# Patient Record
Sex: Female | Born: 1960 | Race: White | Hispanic: No | Smoking: Never smoker
Health system: Southern US, Community
[De-identification: ages and names within clinical notes are randomized; demographics above are authoritative.]

## PROBLEM LIST (undated history)

## (undated) DIAGNOSIS — F329 Major depressive disorder, single episode, unspecified: Secondary | ICD-10-CM

## (undated) DIAGNOSIS — Z9889 Other specified postprocedural states: Secondary | ICD-10-CM

## (undated) DIAGNOSIS — Z87442 Personal history of urinary calculi: Secondary | ICD-10-CM

## (undated) DIAGNOSIS — M1990 Unspecified osteoarthritis, unspecified site: Secondary | ICD-10-CM

## (undated) DIAGNOSIS — R945 Abnormal results of liver function studies: Secondary | ICD-10-CM

## (undated) DIAGNOSIS — C801 Malignant (primary) neoplasm, unspecified: Secondary | ICD-10-CM

## (undated) DIAGNOSIS — I10 Essential (primary) hypertension: Secondary | ICD-10-CM

## (undated) DIAGNOSIS — IMO0001 Reserved for inherently not codable concepts without codable children: Secondary | ICD-10-CM

## (undated) DIAGNOSIS — K219 Gastro-esophageal reflux disease without esophagitis: Secondary | ICD-10-CM

## (undated) DIAGNOSIS — F4310 Post-traumatic stress disorder, unspecified: Secondary | ICD-10-CM

## (undated) DIAGNOSIS — J45909 Unspecified asthma, uncomplicated: Secondary | ICD-10-CM

## (undated) DIAGNOSIS — R519 Headache, unspecified: Secondary | ICD-10-CM

## (undated) DIAGNOSIS — M4802 Spinal stenosis, cervical region: Secondary | ICD-10-CM

## (undated) DIAGNOSIS — F419 Anxiety disorder, unspecified: Secondary | ICD-10-CM

## (undated) DIAGNOSIS — G4730 Sleep apnea, unspecified: Secondary | ICD-10-CM

## (undated) DIAGNOSIS — I219 Acute myocardial infarction, unspecified: Secondary | ICD-10-CM

## (undated) DIAGNOSIS — J189 Pneumonia, unspecified organism: Secondary | ICD-10-CM

## (undated) DIAGNOSIS — R42 Dizziness and giddiness: Secondary | ICD-10-CM

## (undated) DIAGNOSIS — R3129 Other microscopic hematuria: Secondary | ICD-10-CM

## (undated) DIAGNOSIS — M5126 Other intervertebral disc displacement, lumbar region: Secondary | ICD-10-CM

## (undated) DIAGNOSIS — F32A Depression, unspecified: Secondary | ICD-10-CM

## (undated) DIAGNOSIS — H269 Unspecified cataract: Secondary | ICD-10-CM

## (undated) DIAGNOSIS — K589 Irritable bowel syndrome without diarrhea: Secondary | ICD-10-CM

## (undated) DIAGNOSIS — F319 Bipolar disorder, unspecified: Secondary | ICD-10-CM

## (undated) DIAGNOSIS — M51369 Other intervertebral disc degeneration, lumbar region without mention of lumbar back pain or lower extremity pain: Secondary | ICD-10-CM

## (undated) DIAGNOSIS — T753XXA Motion sickness, initial encounter: Secondary | ICD-10-CM

## (undated) DIAGNOSIS — J310 Chronic rhinitis: Secondary | ICD-10-CM

## (undated) DIAGNOSIS — R51 Headache: Secondary | ICD-10-CM

## (undated) DIAGNOSIS — G2581 Restless legs syndrome: Secondary | ICD-10-CM

## (undated) DIAGNOSIS — R7989 Other specified abnormal findings of blood chemistry: Secondary | ICD-10-CM

## (undated) DIAGNOSIS — T1491XA Suicide attempt, initial encounter: Secondary | ICD-10-CM

## (undated) DIAGNOSIS — Z8669 Personal history of other diseases of the nervous system and sense organs: Secondary | ICD-10-CM

## (undated) DIAGNOSIS — R011 Cardiac murmur, unspecified: Secondary | ICD-10-CM

## (undated) DIAGNOSIS — M810 Age-related osteoporosis without current pathological fracture: Secondary | ICD-10-CM

## (undated) DIAGNOSIS — D649 Anemia, unspecified: Secondary | ICD-10-CM

## (undated) DIAGNOSIS — M5136 Other intervertebral disc degeneration, lumbar region: Secondary | ICD-10-CM

## (undated) DIAGNOSIS — R112 Nausea with vomiting, unspecified: Secondary | ICD-10-CM

## (undated) HISTORY — DX: Post-traumatic stress disorder, unspecified: F43.10

## (undated) HISTORY — PX: TONSILLECTOMY: SUR1361

## (undated) HISTORY — DX: Unspecified cataract: H26.9

## (undated) HISTORY — PX: TUBAL LIGATION: SHX77

## (undated) HISTORY — DX: Anxiety disorder, unspecified: F41.9

## (undated) HISTORY — DX: Major depressive disorder, single episode, unspecified: F32.9

## (undated) HISTORY — DX: Other microscopic hematuria: R31.29

## (undated) HISTORY — DX: Unspecified asthma, uncomplicated: J45.909

## (undated) HISTORY — PX: FOOT SURGERY: SHX648

## (undated) HISTORY — DX: Acute myocardial infarction, unspecified: I21.9

## (undated) HISTORY — DX: Depression, unspecified: F32.A

## (undated) HISTORY — DX: Suicide attempt, initial encounter: T14.91XA

## (undated) HISTORY — DX: Personal history of other diseases of the nervous system and sense organs: Z86.69

## (undated) HISTORY — PX: CHOLECYSTECTOMY: SHX55

## (undated) HISTORY — DX: Gastro-esophageal reflux disease without esophagitis: K21.9

## (undated) HISTORY — DX: Chronic rhinitis: J31.0

## (undated) HISTORY — DX: Bipolar disorder, unspecified: F31.9

## (undated) HISTORY — DX: Malignant (primary) neoplasm, unspecified: C80.1

## (undated) HISTORY — DX: Age-related osteoporosis without current pathological fracture: M81.0

## (undated) HISTORY — PX: SEPTOPLASTY: SUR1290

---

## 1987-07-07 HISTORY — PX: VAGINAL HYSTERECTOMY: SUR661

## 2000-07-06 HISTORY — PX: ANTERIOR AND POSTERIOR REPAIR: SHX5121

## 2001-02-23 ENCOUNTER — Inpatient Hospital Stay (HOSPITAL_COMMUNITY): Admission: EM | Admit: 2001-02-23 | Discharge: 2001-02-27 | Payer: Self-pay | Admitting: *Deleted

## 2001-11-02 ENCOUNTER — Other Ambulatory Visit: Admission: RE | Admit: 2001-11-02 | Discharge: 2001-11-02 | Payer: Self-pay | Admitting: Family Medicine

## 2001-11-08 ENCOUNTER — Ambulatory Visit (HOSPITAL_COMMUNITY): Admission: RE | Admit: 2001-11-08 | Discharge: 2001-11-08 | Payer: Self-pay | Admitting: Neurosurgery

## 2002-07-06 HISTORY — PX: BILATERAL SALPINGOOPHORECTOMY: SHX1223

## 2002-10-29 ENCOUNTER — Inpatient Hospital Stay (HOSPITAL_COMMUNITY): Admission: EM | Admit: 2002-10-29 | Discharge: 2002-11-02 | Payer: Self-pay | Admitting: Psychiatry

## 2004-08-05 ENCOUNTER — Inpatient Hospital Stay: Payer: Self-pay | Admitting: Psychiatry

## 2004-09-14 ENCOUNTER — Emergency Department: Payer: Self-pay | Admitting: Unknown Physician Specialty

## 2004-10-23 ENCOUNTER — Ambulatory Visit: Payer: Self-pay

## 2004-12-01 ENCOUNTER — Ambulatory Visit: Payer: Self-pay

## 2005-02-13 ENCOUNTER — Inpatient Hospital Stay: Payer: Self-pay | Admitting: Psychiatry

## 2005-03-24 ENCOUNTER — Emergency Department: Payer: Self-pay | Admitting: Emergency Medicine

## 2005-03-25 ENCOUNTER — Ambulatory Visit: Payer: Self-pay | Admitting: Emergency Medicine

## 2005-03-26 ENCOUNTER — Ambulatory Visit: Payer: Self-pay | Admitting: General Practice

## 2005-04-15 ENCOUNTER — Emergency Department: Payer: Self-pay | Admitting: Emergency Medicine

## 2005-05-08 ENCOUNTER — Ambulatory Visit: Payer: Self-pay

## 2005-05-14 ENCOUNTER — Inpatient Hospital Stay: Payer: Self-pay

## 2005-07-06 HISTORY — PX: CATARACT EXTRACTION: SUR2

## 2005-07-08 ENCOUNTER — Inpatient Hospital Stay: Payer: Self-pay | Admitting: Psychiatry

## 2005-08-25 ENCOUNTER — Ambulatory Visit: Payer: Self-pay | Admitting: Family Medicine

## 2005-12-07 ENCOUNTER — Ambulatory Visit: Payer: Self-pay

## 2006-01-10 ENCOUNTER — Other Ambulatory Visit: Payer: Self-pay

## 2006-01-10 ENCOUNTER — Inpatient Hospital Stay: Payer: Self-pay | Admitting: Internal Medicine

## 2006-02-10 ENCOUNTER — Ambulatory Visit: Payer: Self-pay | Admitting: Gastroenterology

## 2006-04-11 ENCOUNTER — Emergency Department: Payer: Self-pay | Admitting: Emergency Medicine

## 2006-04-12 ENCOUNTER — Ambulatory Visit: Payer: Self-pay | Admitting: Family Medicine

## 2006-04-27 ENCOUNTER — Ambulatory Visit: Payer: Self-pay | Admitting: Gastroenterology

## 2006-06-26 ENCOUNTER — Emergency Department: Payer: Self-pay | Admitting: Emergency Medicine

## 2006-07-06 HISTORY — PX: SHOULDER ARTHROSCOPY: SHX128

## 2006-07-06 HISTORY — PX: CARDIAC CATHETERIZATION: SHX172

## 2006-07-18 ENCOUNTER — Emergency Department: Payer: Self-pay | Admitting: Emergency Medicine

## 2006-07-21 ENCOUNTER — Emergency Department: Payer: Self-pay | Admitting: Emergency Medicine

## 2006-07-21 ENCOUNTER — Other Ambulatory Visit: Payer: Self-pay

## 2006-07-22 ENCOUNTER — Emergency Department: Payer: Self-pay | Admitting: Emergency Medicine

## 2006-07-23 ENCOUNTER — Ambulatory Visit: Payer: Self-pay | Admitting: Emergency Medicine

## 2006-07-29 ENCOUNTER — Emergency Department (HOSPITAL_COMMUNITY): Admission: EM | Admit: 2006-07-29 | Discharge: 2006-07-30 | Payer: Self-pay | Admitting: Emergency Medicine

## 2006-08-23 ENCOUNTER — Ambulatory Visit: Payer: Self-pay

## 2006-09-02 ENCOUNTER — Ambulatory Visit: Payer: Self-pay | Admitting: Urology

## 2006-09-23 ENCOUNTER — Ambulatory Visit: Payer: Self-pay | Admitting: Cardiovascular Disease

## 2006-12-29 ENCOUNTER — Ambulatory Visit: Payer: Self-pay

## 2007-01-04 ENCOUNTER — Ambulatory Visit: Payer: Self-pay

## 2007-01-07 ENCOUNTER — Ambulatory Visit: Payer: Self-pay | Admitting: Emergency Medicine

## 2007-01-13 ENCOUNTER — Ambulatory Visit: Payer: Self-pay | Admitting: Emergency Medicine

## 2007-01-13 ENCOUNTER — Ambulatory Visit: Payer: Self-pay

## 2007-01-17 ENCOUNTER — Ambulatory Visit: Payer: Self-pay | Admitting: Family Medicine

## 2007-01-21 ENCOUNTER — Ambulatory Visit: Payer: Self-pay | Admitting: Emergency Medicine

## 2007-01-22 ENCOUNTER — Emergency Department: Payer: Self-pay | Admitting: Emergency Medicine

## 2007-02-05 ENCOUNTER — Ambulatory Visit: Payer: Self-pay | Admitting: Internal Medicine

## 2007-02-10 ENCOUNTER — Emergency Department: Payer: Self-pay

## 2007-02-10 ENCOUNTER — Other Ambulatory Visit: Payer: Self-pay

## 2007-03-06 ENCOUNTER — Emergency Department: Payer: Self-pay | Admitting: Internal Medicine

## 2007-04-12 ENCOUNTER — Emergency Department: Payer: Self-pay | Admitting: Emergency Medicine

## 2007-04-13 ENCOUNTER — Other Ambulatory Visit: Payer: Self-pay

## 2007-04-14 ENCOUNTER — Inpatient Hospital Stay: Payer: Self-pay

## 2007-06-11 ENCOUNTER — Ambulatory Visit: Payer: Self-pay | Admitting: Internal Medicine

## 2007-07-15 ENCOUNTER — Encounter: Payer: Self-pay | Admitting: Specialist

## 2007-07-21 ENCOUNTER — Ambulatory Visit: Payer: Self-pay

## 2007-08-20 ENCOUNTER — Emergency Department: Payer: Self-pay | Admitting: Internal Medicine

## 2007-10-18 ENCOUNTER — Ambulatory Visit: Payer: Self-pay | Admitting: Unknown Physician Specialty

## 2007-10-20 ENCOUNTER — Ambulatory Visit: Payer: Self-pay | Admitting: Unknown Physician Specialty

## 2008-01-06 ENCOUNTER — Inpatient Hospital Stay: Payer: Self-pay | Admitting: Unknown Physician Specialty

## 2008-03-26 ENCOUNTER — Ambulatory Visit: Payer: Self-pay

## 2008-05-30 ENCOUNTER — Ambulatory Visit: Payer: Self-pay

## 2008-06-26 ENCOUNTER — Ambulatory Visit: Payer: Self-pay

## 2008-08-28 ENCOUNTER — Ambulatory Visit: Payer: Self-pay | Admitting: Family Medicine

## 2008-10-13 ENCOUNTER — Ambulatory Visit: Payer: Self-pay | Admitting: Unknown Physician Specialty

## 2009-01-25 ENCOUNTER — Ambulatory Visit: Payer: Self-pay | Admitting: Family Medicine

## 2009-01-28 ENCOUNTER — Ambulatory Visit: Payer: Self-pay | Admitting: Gastroenterology

## 2009-01-29 ENCOUNTER — Emergency Department: Payer: Self-pay | Admitting: Emergency Medicine

## 2009-04-13 ENCOUNTER — Emergency Department: Payer: Self-pay | Admitting: Emergency Medicine

## 2009-08-01 ENCOUNTER — Ambulatory Visit: Payer: Self-pay | Admitting: Physical Medicine & Rehabilitation

## 2009-08-16 ENCOUNTER — Ambulatory Visit: Payer: Self-pay | Admitting: Unknown Physician Specialty

## 2009-09-02 ENCOUNTER — Ambulatory Visit: Payer: Self-pay | Admitting: Internal Medicine

## 2010-03-21 ENCOUNTER — Inpatient Hospital Stay: Payer: Self-pay | Admitting: Internal Medicine

## 2010-04-23 ENCOUNTER — Emergency Department: Payer: Self-pay | Admitting: Unknown Physician Specialty

## 2010-05-10 ENCOUNTER — Emergency Department: Payer: Self-pay | Admitting: Emergency Medicine

## 2010-06-12 ENCOUNTER — Emergency Department: Payer: Self-pay | Admitting: Emergency Medicine

## 2010-06-12 ENCOUNTER — Ambulatory Visit: Payer: Self-pay | Admitting: Family Medicine

## 2010-09-04 ENCOUNTER — Emergency Department: Payer: Self-pay | Admitting: Emergency Medicine

## 2010-09-15 ENCOUNTER — Emergency Department: Payer: Self-pay | Admitting: Emergency Medicine
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
## 2010-11-21 NOTE — Discharge Summary (Signed)
[facility]  Patient:    Brandi Bell, Brandi Bell Visit Number: [id number] MRN: [id number]          Service Type: PSY Location: 50 0502 02 Attending Physician:  Denny Peon Dictated by:   Netta Cedars, M.D. Admit Date:  02/23/2001 Discharge Date: 02/27/2001                             Discharge Summary  INTRODUCTION:  Brandi Bell is a 50 year old divorced white female mother of three children ranging between ages 85 and 57.  She was admitted on involuntary papers after expressing suicidal thoughts in the emergency room. These suicidal thoughts and intentions were related to severe groin pain she developed and which cause was not yet established.  She had a significant psychiatric history for both inpatient and outpatient treatment and allegedly suffers from dissociative identity disorder, having at least three or four competing personalities.  Medically, she suffers from irritable bowel syndrome and recurrent migraine headaches as well as most recently groin pain.  HOSPITAL COURSE:  After admission to the ward, the patient was placed on special observation.  She was restarted on her previous medications.  Seroquel was decreased to 600 mg daily and Paxil increased to 75 mg daily.  Because of mood instability, anger, and anxiety, I started her on Neurontin 100 mg three times a day, avoiding benzodiazepines unless absolutely necessary.  The patient was also started on amoxicillin for symptoms of UTI.  In the process, Neurontin was increased to 100 mg twice a day and 200 mg at night with good results.  Gradually, the patients mood and affect improved.  On August 24 and August 25, was seen by Dr. Dub Mikes.  She presented with no dissociative episodes, no suicidal or no homicidal urging, and the patient felt ready to be discharged to outpatient service.  It was felt that the patient was ready for discharge.  She was seen again on August 24 and August 25.   She did not express dangerous ideations, no suicidal or homicidal ideations and no delusions.  In the future, we may think about resigning from Paxil and to leave the patient only on Neurontin as a mood stabilizer since tiredness and drowsiness were persistent.  REVIEW OF ADDITIONAL LABORATORY DATA:  Normal CBC with differential.  Normal Chem 17.  Normal transaminase.  Normal thyroid function tests.  DISCHARGE DIAGNOSES: Axis I:    1. Major depressive disorder with psychotic features.            2. Post-traumatic stress disorder.            3. Dissociative identity disorder. Axis II:   Borderline personality disorder, severe. Axis III:  1. Migraine headaches.            2. Irritable bowel syndrome.            3. Chronic groin pain.  DISCHARGE MEDICATIONS: 1. Klonopin 0.5 mg twice a day. 2. Seroquel 200 mg in the morning and 400 mg at bedtime. 3. Paxil 30 mg at bedtime. 4. Elavil 25 mg at bedtime. 5. Amoxicillin 500 mg three times a day for the next five days. 6. Bextra 10 mg daily. 7. Neurontin 300 mg one in the morning and three at bedtime.  RECOMMENDATIONS:  The patient has an appointment with Dr. Georjean Mode at [facility].  She did not have any side effects from Neurontin and felt that this medication "  holds her on an even keel."  As mentioned before, the patient seemed to be reaching the level of existence which characterized her previous years.  Decision was made to discharge her to the same environment with recommendation to follow up with mental health center.  The patient knows that resources are available after her discharge.  She should call mental health if problems with medication or recurrence of symptoms.  The patient understood instructions and at the time of discharge did not have any side effects and was discharged in good condition home. Dictated by:   Netta Cedars, M.D. Attending Physician:  Denny Peon DD:  04/06/01 TD:   04/06/01 Job: [id number]
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
## 2010-11-21 NOTE — Discharge Summary (Signed)
NAME:  Brandi Bell, Brandi Bell                            ACCOUNT NO.:  [id number]   MEDICAL RECORD NO.:  [id number]                   PATIENT TYPE:  IPS   LOCATION:  0306                                 [facility]ILITY[facility]  [facility]   PHYSICIAN:  Jeanice Lim, M.D.              DATE OF BIRTH:  [birth year]   DATE OF ADMISSION:  10/29/2002  DATE OF DISCHARGE:  11/02/2002                                 DISCHARGE SUMMARY   IDENTIFYING DATA:  This is a 50 year old married Caucasian female  voluntarily admitted.  She presented with husband, angry, agitated with  visual hallucinations, auditory hallucinations, wanting to cut self.  Mood:  Labile, agitated, unpredictable.   MEDICATIONS:  1. Senokot.  2. Geodon 80 mg b.i.d.  3. Klonopin 1 mg t.i.d.  4. Neurontin 300 mg q.a.m. and 600 mg q.h.s.  5. Calcium and vitamin E.  6. Haldol 1 mg b.i.d.   DRUG ALLERGIES:  CODEINE and CIPRO.   PHYSICAL EXAMINATION:  GENERAL:  Essentially within normal limits.  NEUROLOGIC:  Nonfocal.   LABORATORY DATA:  Routine admission labs: Liver enzymes: SGOT and SGPT  slightly elevated at 40 and 61, respectively.  CBC: Within normal limits.   MENTAL STATUS EXAM:  Obese female in no acute distress.  Blunted affect,  cooperative.  Speech: Within normal limits.  Mood: Depressed, mildly  anxious.  Thought process: Mostly goal directed; vague suicidal ideation  with no plan.  Cognitive: Intact.  Judgment and insight: Fair.   ADMISSION DIAGNOSES:   AXIS I:  1. Major depressive disorder, recurrent, severe with psychotic symptoms.  2. Posttraumatic stress disorder by history.   AXIS II:  Deferred.   AXIS III:  Pituitary adenoma.   AXIS IV:  Moderate problems with primary support group.   AXIS V:  29/59   HOSPITAL COURSE:  The patient was admitted, ordered routine p.r.n.  medications, underwent further monitoring, and was encouraged to participate  in individual, group, and milieu therapy.  The patient's urine toxicity  screen was positive for benzodiazepines.  TSH was 6.13, slightly elevated;  repeat was 1.8.  Liver function tests returned to more normal limits.  Prolactin level was 55.8, which is elevated unless in a pregnant state  consistent with pituitary adenoma.  The patient was optimized on Geodon and  Ativan.  Haldol was discontinued and Loxitane started.  The patient reported  a positive response to medication changes.  Geodon was further optimized to  120 mg and Loxitane was ordered just p.r.n.  EKG was ordered to evaluate QTc  due to elevated dose.  The patient has an incomplete right bundle branch  block.  QTc was 324/398 and there was normal sinus rhythm.  Cardiology  reported no concerns regarding being on Geodon.   CONDITION ON DISCHARGE:  The patient's condition on discharge was markedly  improved.  Mood was more euthymic.  Affect: Brighter.  Thought processes:  Goal directed.  Thought content: Negative for dangerous ideation or  psychotic symptoms.   DISCHARGE MEDICATIONS:  1. Os-Cal.  2. Lexapro 20 mg q.a.m.  3. Vitamin E.  4. Neurontin 300 mg b.i.d. and two q.h.s.  5. Ativan 1 mg t.i.d. and two q.h.s.  6. Loxitane 10 mg b.i.d. and q.12h. p.r.n.  7. Geodon 120 mg q.a.m., 120 mg at 6 p.m. with food.   FOLLOW UP:  The patient was to follow up with Dr. Derrill Center and Christene Slates. Katrinka Blazing, M.D., on May 4 at 10:30.   DISCHARGE DIAGNOSES:   AXIS I:  1. Major depressive disorder, recurrent, severe with psychotic symptoms.  2. Posttraumatic stress disorder by history.   AXIS II:  Deferred.   AXIS III:  Pituitary adenoma.   AXIS IV:  Moderate problems with primary support group.   AXIS V:  Global assessment of functioning on discharge was 55.                                               Jeanice Lim, M.D.    JEM/MEDQ  D:  11/29/2002  T:  11/29/2002  Job:  [id number]

## 2011-02-13 ENCOUNTER — Emergency Department: Payer: Self-pay | Admitting: Emergency Medicine

## 2011-03-18 ENCOUNTER — Emergency Department: Payer: Self-pay | Admitting: Emergency Medicine

## 2011-03-18 ENCOUNTER — Inpatient Hospital Stay: Payer: Self-pay | Admitting: Internal Medicine

## 2011-06-10 ENCOUNTER — Ambulatory Visit: Payer: Self-pay | Admitting: Gastroenterology

## 2011-06-24 ENCOUNTER — Ambulatory Visit: Payer: Self-pay | Admitting: Gastroenterology

## 2011-06-26 LAB — PATHOLOGY REPORT

## 2012-02-01 ENCOUNTER — Inpatient Hospital Stay: Payer: Self-pay | Admitting: Family Medicine

## 2012-02-01 LAB — CBC WITH DIFFERENTIAL/PLATELET
Basophil #: 0.1 10*3/uL (ref 0.0–0.1)
Eosinophil #: 0 10*3/uL (ref 0.0–0.7)
HGB: 15.5 g/dL (ref 12.0–16.0)
Lymphocyte %: 10 %
MCH: 30.9 pg (ref 26.0–34.0)
MCHC: 34.7 g/dL (ref 32.0–36.0)
MCV: 89 fL (ref 80–100)
Monocyte #: 0.5 x10 3/mm (ref 0.2–0.9)
Neutrophil %: 84.8 %
Platelet: 291 10*3/uL (ref 150–440)
RBC: 5.03 10*6/uL (ref 3.80–5.20)
WBC: 12.2 10*3/uL — ABNORMAL HIGH (ref 3.6–11.0)

## 2012-02-01 LAB — COMPREHENSIVE METABOLIC PANEL
Bilirubin,Total: 0.5 mg/dL (ref 0.2–1.0)
Chloride: 108 mmol/L — ABNORMAL HIGH (ref 98–107)
Co2: 21 mmol/L (ref 21–32)
Creatinine: 1 mg/dL (ref 0.60–1.30)
EGFR (African American): >60
EGFR (Non-African Amer.): >60
Osmolality: 286 (ref 275–301)
Sodium: 142 mmol/L (ref 136–145)
Total Protein: 7.6 g/dL (ref 6.4–8.2)

## 2012-02-02 LAB — BASIC METABOLIC PANEL
Anion Gap: 10 (ref 7–16)
BUN: 8 mg/dL (ref 7–18)
Chloride: 113 mmol/L — ABNORMAL HIGH (ref 98–107)
Creatinine: 0.65 mg/dL (ref 0.60–1.30)
EGFR (African American): >60
EGFR (Non-African Amer.): >60
Sodium: 145 mmol/L (ref 136–145)

## 2012-02-02 LAB — CBC WITH DIFFERENTIAL/PLATELET
Basophil #: 0 10*3/uL (ref 0.0–0.1)
Eosinophil #: 0 10*3/uL (ref 0.0–0.7)
HCT: 39.6 % (ref 35.0–47.0)
HGB: 13.9 g/dL (ref 12.0–16.0)
Lymphocyte #: 0.6 10*3/uL — ABNORMAL LOW (ref 1.0–3.6)
MCH: 31.5 pg (ref 26.0–34.0)
MCHC: 35 g/dL (ref 32.0–36.0)
MCV: 90 fL (ref 80–100)
Monocyte %: 1.4 %
Neutrophil #: 15.1 10*3/uL — ABNORMAL HIGH (ref 1.4–6.5)
Platelet: 235 10*3/uL (ref 150–440)
RDW: 12.7 % (ref 11.5–14.5)

## 2012-02-03 LAB — BASIC METABOLIC PANEL
Anion Gap: 8 (ref 7–16)
BUN: 12 mg/dL (ref 7–18)
Creatinine: 0.58 mg/dL — ABNORMAL LOW (ref 0.60–1.30)
EGFR (African American): >60
EGFR (Non-African Amer.): >60
Glucose: 136 mg/dL — ABNORMAL HIGH (ref 65–99)
Osmolality: 292 (ref 275–301)

## 2012-02-03 LAB — CBC WITH DIFFERENTIAL/PLATELET
Eosinophil #: 0 10*3/uL (ref 0.0–0.7)
Eosinophil %: 0 %
Lymphocyte #: 0.8 10*3/uL — ABNORMAL LOW (ref 1.0–3.6)
MCH: 31.1 pg (ref 26.0–34.0)
MCHC: 34.5 g/dL (ref 32.0–36.0)
MCV: 90 fL (ref 80–100)
Monocyte #: 0.5 x10 3/mm (ref 0.2–0.9)
Neutrophil %: 94.5 %
Platelet: 256 10*3/uL (ref 150–440)
RBC: 4.61 10*6/uL (ref 3.80–5.20)

## 2012-02-03 LAB — TROPONIN I: Troponin-I: <0.02 ng/mL

## 2012-02-03 LAB — CK TOTAL AND CKMB (NOT AT ARMC)
CK, Total: 45 U/L (ref 21–215)
CK-MB: 0.7 ng/mL (ref 0.5–3.6)

## 2012-02-04 LAB — BASIC METABOLIC PANEL
Anion Gap: 10 (ref 7–16)
Calcium, Total: 8.7 mg/dL (ref 8.5–10.1)
Chloride: 106 mmol/L (ref 98–107)
Co2: 26 mmol/L (ref 21–32)
Creatinine: 0.58 mg/dL — ABNORMAL LOW (ref 0.60–1.30)
EGFR (African American): >60
EGFR (Non-African Amer.): >60
Osmolality: 284 (ref 275–301)

## 2012-02-04 LAB — CBC WITH DIFFERENTIAL/PLATELET
Basophil %: 0.2 %
Eosinophil %: 0.1 %
HCT: 40.2 % (ref 35.0–47.0)
HGB: 14 g/dL (ref 12.0–16.0)
Lymphocyte #: 0.9 10*3/uL — ABNORMAL LOW (ref 1.0–3.6)
Lymphocyte %: 5.5 %
MCH: 31.5 pg (ref 26.0–34.0)
MCHC: 34.7 g/dL (ref 32.0–36.0)
Neutrophil %: 90.1 %
RBC: 4.44 10*6/uL (ref 3.80–5.20)

## 2012-02-08 LAB — CREATININE, SERUM
Creatinine: 0.52 mg/dL — ABNORMAL LOW (ref 0.60–1.30)
EGFR (Non-African Amer.): >60

## 2012-02-23 ENCOUNTER — Inpatient Hospital Stay: Payer: Self-pay | Admitting: Student

## 2012-02-23 LAB — CBC
HCT: 43.5 % (ref 35.0–47.0)
HGB: 14.5 g/dL (ref 12.0–16.0)
MCHC: 33.3 g/dL (ref 32.0–36.0)
Platelet: 342 10*3/uL (ref 150–440)
RBC: 4.71 10*6/uL (ref 3.80–5.20)
WBC: 14.5 10*3/uL — ABNORMAL HIGH (ref 3.6–11.0)

## 2012-02-23 LAB — COMPREHENSIVE METABOLIC PANEL
Albumin: 3.5 g/dL (ref 3.4–5.0)
Alkaline Phosphatase: 98 U/L (ref 50–136)
Bilirubin,Total: 0.8 mg/dL (ref 0.2–1.0)
Calcium, Total: 9.1 mg/dL (ref 8.5–10.1)
Chloride: 108 mmol/L — ABNORMAL HIGH (ref 98–107)
Co2: 23 mmol/L (ref 21–32)
Creatinine: 0.96 mg/dL (ref 0.60–1.30)
EGFR (African American): >60
EGFR (Non-African Amer.): >60
Osmolality: 288 (ref 275–301)
SGOT(AST): 17 U/L (ref 15–37)
SGPT (ALT): 38 U/L (ref 12–78)
Sodium: 144 mmol/L (ref 136–145)

## 2012-02-23 LAB — URINALYSIS, COMPLETE
Blood: NEGATIVE
Ketone: NEGATIVE
Leukocyte Esterase: NEGATIVE
Nitrite: NEGATIVE
Ph: 6 (ref 4.5–8.0)
Protein: NEGATIVE
RBC,UR: 1 /HPF (ref 0–5)
Squamous Epithelial: <1

## 2012-02-23 LAB — CK TOTAL AND CKMB (NOT AT ARMC): CK-MB: <0.5 ng/mL — ABNORMAL LOW (ref 0.5–3.6)

## 2012-02-23 LAB — MAGNESIUM: Magnesium: 2 mg/dL

## 2012-02-23 LAB — PROTIME-INR: INR: 0.8

## 2012-02-24 LAB — CBC WITH DIFFERENTIAL/PLATELET
Basophil #: 0.1 10*3/uL (ref 0.0–0.1)
Eosinophil #: 0 10*3/uL (ref 0.0–0.7)
HCT: 38.2 % (ref 35.0–47.0)
Lymphocyte #: 0.6 10*3/uL — ABNORMAL LOW (ref 1.0–3.6)
MCH: 31.2 pg (ref 26.0–34.0)
MCV: 92 fL (ref 80–100)
Monocyte #: 0.3 x10 3/mm (ref 0.2–0.9)
Monocyte %: 2.8 %
Neutrophil #: 9 10*3/uL — ABNORMAL HIGH (ref 1.4–6.5)
Platelet: 233 10*3/uL (ref 150–440)
RDW: 12.8 % (ref 11.5–14.5)
WBC: 10 10*3/uL (ref 3.6–11.0)

## 2012-02-24 LAB — BASIC METABOLIC PANEL
Anion Gap: 10 (ref 7–16)
BUN: 8 mg/dL (ref 7–18)
Calcium, Total: 8.5 mg/dL (ref 8.5–10.1)
Chloride: 114 mmol/L — ABNORMAL HIGH (ref 98–107)
Co2: 23 mmol/L (ref 21–32)
EGFR (Non-African Amer.): >60
Glucose: 145 mg/dL — ABNORMAL HIGH (ref 65–99)
Osmolality: 293 (ref 275–301)
Sodium: 147 mmol/L — ABNORMAL HIGH (ref 136–145)

## 2012-02-24 LAB — MAGNESIUM: Magnesium: 2.6 mg/dL — ABNORMAL HIGH

## 2012-02-25 LAB — BASIC METABOLIC PANEL
Anion Gap: 8 (ref 7–16)
Calcium, Total: 8.5 mg/dL (ref 8.5–10.1)
Co2: 26 mmol/L (ref 21–32)
Creatinine: 0.57 mg/dL — ABNORMAL LOW (ref 0.60–1.30)
EGFR (African American): >60
Glucose: 123 mg/dL — ABNORMAL HIGH (ref 65–99)
Osmolality: 289 (ref 275–301)
Sodium: 145 mmol/L (ref 136–145)

## 2012-03-10 ENCOUNTER — Ambulatory Visit: Payer: Self-pay | Admitting: Urology

## 2012-03-16 ENCOUNTER — Ambulatory Visit: Payer: Self-pay | Admitting: Urology

## 2012-03-19 ENCOUNTER — Emergency Department: Payer: Self-pay | Admitting: Emergency Medicine

## 2012-03-19 LAB — BASIC METABOLIC PANEL
Anion Gap: 10 (ref 7–16)
BUN: 13 mg/dL (ref 7–18)
Calcium, Total: 9.2 mg/dL (ref 8.5–10.1)
Co2: 25 mmol/L (ref 21–32)
Creatinine: 0.67 mg/dL (ref 0.60–1.30)
EGFR (African American): >60
EGFR (Non-African Amer.): >60
Osmolality: 288 (ref 275–301)
Sodium: 145 mmol/L (ref 136–145)

## 2012-03-19 LAB — CBC
HCT: 44 % (ref 35.0–47.0)
HGB: 14.5 g/dL (ref 12.0–16.0)
MCHC: 33 g/dL (ref 32.0–36.0)
MCV: 92 fL (ref 80–100)
RDW: 13.7 % (ref 11.5–14.5)
WBC: 10 10*3/uL (ref 3.6–11.0)

## 2012-03-19 LAB — TROPONIN I: Troponin-I: <0.02 ng/mL

## 2012-05-23 ENCOUNTER — Encounter: Payer: Self-pay | Admitting: *Deleted

## 2012-05-26 ENCOUNTER — Ambulatory Visit: Payer: Self-pay | Admitting: Cardiovascular Disease

## 2012-06-26 ENCOUNTER — Ambulatory Visit: Payer: Self-pay | Admitting: Specialist

## 2012-07-20 DIAGNOSIS — M5126 Other intervertebral disc displacement, lumbar region: Secondary | ICD-10-CM | POA: Insufficient documentation

## 2012-07-20 DIAGNOSIS — E785 Hyperlipidemia, unspecified: Secondary | ICD-10-CM | POA: Insufficient documentation

## 2012-07-20 DIAGNOSIS — Z8541 Personal history of malignant neoplasm of cervix uteri: Secondary | ICD-10-CM | POA: Insufficient documentation

## 2012-07-20 DIAGNOSIS — N63 Unspecified lump in unspecified breast: Secondary | ICD-10-CM | POA: Insufficient documentation

## 2012-07-20 DIAGNOSIS — B009 Herpesviral infection, unspecified: Secondary | ICD-10-CM | POA: Insufficient documentation

## 2012-07-28 ENCOUNTER — Emergency Department: Payer: Self-pay | Admitting: Emergency Medicine

## 2012-07-28 LAB — BASIC METABOLIC PANEL
Anion Gap: 13 (ref 7–16)
Co2: 20 mmol/L — ABNORMAL LOW (ref 21–32)
EGFR (African American): >60
EGFR (Non-African Amer.): >60
Glucose: 156 mg/dL — ABNORMAL HIGH (ref 65–99)
Sodium: 139 mmol/L (ref 136–145)

## 2012-07-28 LAB — CBC
HCT: 43 % (ref 35.0–47.0)
HGB: 14.9 g/dL (ref 12.0–16.0)
MCH: 30.4 pg (ref 26.0–34.0)
Platelet: 272 10*3/uL (ref 150–440)
RBC: 4.9 10*6/uL (ref 3.80–5.20)
RDW: 12.7 % (ref 11.5–14.5)

## 2012-07-28 LAB — CK TOTAL AND CKMB (NOT AT ARMC): CK-MB: 0.6 ng/mL (ref 0.5–3.6)

## 2012-08-24 ENCOUNTER — Emergency Department: Payer: Self-pay | Admitting: Internal Medicine

## 2012-08-24 LAB — URINALYSIS, COMPLETE
Bilirubin,UR: NEGATIVE
Ketone: NEGATIVE
Nitrite: NEGATIVE
Ph: 7 (ref 4.5–8.0)
RBC,UR: 1 /HPF (ref 0–5)
Specific Gravity: 1.01 (ref 1.003–1.030)
Squamous Epithelial: 1

## 2012-08-24 LAB — COMPREHENSIVE METABOLIC PANEL
Albumin: 3.8 g/dL (ref 3.4–5.0)
Chloride: 107 mmol/L (ref 98–107)
Co2: 19 mmol/L — ABNORMAL LOW (ref 21–32)
Creatinine: 0.9 mg/dL (ref 0.60–1.30)
Osmolality: 280 (ref 275–301)
Potassium: 4 mmol/L (ref 3.5–5.1)
SGPT (ALT): 33 U/L (ref 12–78)

## 2012-08-24 LAB — CBC
HGB: 15.4 g/dL (ref 12.0–16.0)
MCH: 30.3 pg (ref 26.0–34.0)
MCV: 89 fL (ref 80–100)
Platelet: 338 10*3/uL (ref 150–440)

## 2012-10-13 ENCOUNTER — Ambulatory Visit: Payer: Self-pay | Admitting: Family Medicine

## 2012-12-20 ENCOUNTER — Emergency Department: Payer: Self-pay | Admitting: Emergency Medicine

## 2012-12-20 LAB — URINALYSIS, COMPLETE
Bilirubin,UR: NEGATIVE
Blood: NEGATIVE
Glucose,UR: NEGATIVE mg/dL (ref 0–75)
Nitrite: NEGATIVE
Protein: NEGATIVE
RBC,UR: 1 /HPF (ref 0–5)
Specific Gravity: 1.015 (ref 1.003–1.030)
Squamous Epithelial: 3
WBC UR: 2 /HPF (ref 0–5)

## 2012-12-20 LAB — CBC
HCT: 42.6 % (ref 35.0–47.0)
HGB: 15 g/dL (ref 12.0–16.0)
MCH: 30.7 pg (ref 26.0–34.0)
MCHC: 35.3 g/dL (ref 32.0–36.0)
Platelet: 236 10*3/uL (ref 150–440)
RBC: 4.89 10*6/uL (ref 3.80–5.20)
RDW: 12.7 % (ref 11.5–14.5)
WBC: 13.6 10*3/uL — ABNORMAL HIGH (ref 3.6–11.0)

## 2012-12-20 LAB — COMPREHENSIVE METABOLIC PANEL
Albumin: 3.6 g/dL (ref 3.4–5.0)
Alkaline Phosphatase: 115 U/L (ref 50–136)
Anion Gap: 8 (ref 7–16)
BUN: 11 mg/dL (ref 7–18)
Chloride: 105 mmol/L (ref 98–107)
Glucose: 100 mg/dL — ABNORMAL HIGH (ref 65–99)
Osmolality: 275 (ref 275–301)
Potassium: 3.8 mmol/L (ref 3.5–5.1)
SGOT(AST): 25 U/L (ref 15–37)
SGPT (ALT): 39 U/L (ref 12–78)
Total Protein: 6.8 g/dL (ref 6.4–8.2)

## 2013-02-04 ENCOUNTER — Emergency Department: Payer: Self-pay | Admitting: Emergency Medicine

## 2013-02-05 ENCOUNTER — Emergency Department: Payer: Self-pay | Admitting: Emergency Medicine

## 2013-03-15 ENCOUNTER — Emergency Department: Payer: Self-pay

## 2013-03-15 LAB — CBC
HCT: 41.5 % (ref 35.0–47.0)
MCH: 31.2 pg (ref 26.0–34.0)
MCHC: 34.6 g/dL (ref 32.0–36.0)
MCV: 90 fL (ref 80–100)
Platelet: 256 10*3/uL (ref 150–440)

## 2013-03-15 LAB — COMPREHENSIVE METABOLIC PANEL
Alkaline Phosphatase: 135 U/L (ref 50–136)
Anion Gap: 5 — ABNORMAL LOW (ref 7–16)
BUN: 7 mg/dL (ref 7–18)
Bilirubin,Total: 0.8 mg/dL (ref 0.2–1.0)
Calcium, Total: 9.3 mg/dL (ref 8.5–10.1)
Chloride: 107 mmol/L (ref 98–107)
Creatinine: 0.68 mg/dL (ref 0.60–1.30)
Glucose: 99 mg/dL (ref 65–99)
Osmolality: 276 (ref 275–301)
Potassium: 3.8 mmol/L (ref 3.5–5.1)
SGPT (ALT): 77 U/L (ref 12–78)
Sodium: 139 mmol/L (ref 136–145)

## 2013-03-15 LAB — URINALYSIS, COMPLETE
Bilirubin,UR: NEGATIVE
Blood: NEGATIVE
Ph: 7 (ref 4.5–8.0)
Protein: NEGATIVE
Specific Gravity: 1.003 (ref 1.003–1.030)
Squamous Epithelial: 1
WBC UR: 1 /HPF (ref 0–5)

## 2013-03-15 LAB — LIPASE, BLOOD: Lipase: 208 U/L (ref 73–393)

## 2013-03-18 ENCOUNTER — Inpatient Hospital Stay: Payer: Self-pay | Admitting: Internal Medicine

## 2013-03-18 ENCOUNTER — Emergency Department: Payer: Self-pay | Admitting: Emergency Medicine

## 2013-03-18 LAB — BASIC METABOLIC PANEL
Anion Gap: 9 (ref 7–16)
Calcium, Total: 9.1 mg/dL (ref 8.5–10.1)
Chloride: 108 mmol/L — ABNORMAL HIGH (ref 98–107)
Co2: 25 mmol/L (ref 21–32)
Potassium: 2.8 mmol/L — ABNORMAL LOW (ref 3.5–5.1)
Sodium: 142 mmol/L (ref 136–145)

## 2013-03-18 LAB — CBC
MCH: 31 pg (ref 26.0–34.0)
MCHC: 34.5 g/dL (ref 32.0–36.0)
RBC: 4.38 10*6/uL (ref 3.80–5.20)
RDW: 12.5 % (ref 11.5–14.5)

## 2013-03-18 LAB — TROPONIN I: Troponin-I: <0.02 ng/mL

## 2013-03-19 LAB — BASIC METABOLIC PANEL
Anion Gap: 10 (ref 7–16)
Calcium, Total: 9.2 mg/dL (ref 8.5–10.1)
Creatinine: 0.76 mg/dL (ref 0.60–1.30)
EGFR (African American): >60
EGFR (Non-African Amer.): >60
Glucose: 170 mg/dL — ABNORMAL HIGH (ref 65–99)
Osmolality: 280 (ref 275–301)
Potassium: 4.1 mmol/L (ref 3.5–5.1)
Sodium: 139 mmol/L (ref 136–145)

## 2013-03-19 LAB — TROPONIN I
Troponin-I: <0.02 ng/mL
Troponin-I: <0.02 ng/mL

## 2013-06-20 DIAGNOSIS — F319 Bipolar disorder, unspecified: Secondary | ICD-10-CM | POA: Insufficient documentation

## 2013-06-20 DIAGNOSIS — R110 Nausea: Secondary | ICD-10-CM | POA: Insufficient documentation

## 2013-06-20 DIAGNOSIS — J4540 Moderate persistent asthma, uncomplicated: Secondary | ICD-10-CM | POA: Insufficient documentation

## 2013-08-17 ENCOUNTER — Ambulatory Visit: Payer: Self-pay | Admitting: Podiatrist

## 2013-11-06 ENCOUNTER — Inpatient Hospital Stay: Payer: Self-pay | Admitting: Internal Medicine

## 2013-11-06 LAB — BASIC METABOLIC PANEL
ANION GAP: 10 (ref 7–16)
BUN: 10 mg/dL (ref 7–18)
CO2: 24 mmol/L (ref 21–32)
Calcium, Total: 9.1 mg/dL (ref 8.5–10.1)
Chloride: 104 mmol/L (ref 98–107)
Creatinine: 0.9 mg/dL (ref 0.60–1.30)
GLUCOSE: 158 mg/dL — AB (ref 65–99)
Osmolality: 278 (ref 275–301)
Potassium: 3.5 mmol/L (ref 3.5–5.1)
Sodium: 138 mmol/L (ref 136–145)

## 2013-11-06 LAB — CBC
HCT: 42.7 % (ref 35.0–47.0)
HGB: 14.5 g/dL (ref 12.0–16.0)
MCH: 30.8 pg (ref 26.0–34.0)
MCHC: 34 g/dL (ref 32.0–36.0)
MCV: 91 fL (ref 80–100)
Platelet: 242 10*3/uL (ref 150–440)
RBC: 4.71 10*6/uL (ref 3.80–5.20)
RDW: 12.5 % (ref 11.5–14.5)
WBC: 12 10*3/uL — ABNORMAL HIGH (ref 3.6–11.0)

## 2013-11-06 LAB — URINALYSIS, COMPLETE
BACTERIA: NONE SEEN
BILIRUBIN, UR: NEGATIVE
GLUCOSE, UR: NEGATIVE mg/dL (ref 0–75)
KETONE: NEGATIVE
Leukocyte Esterase: NEGATIVE
Nitrite: NEGATIVE
PROTEIN: NEGATIVE
Ph: 6 (ref 4.5–8.0)
Specific Gravity: 1.006 (ref 1.003–1.030)
Squamous Epithelial: NONE SEEN
WBC UR: <1 /HPF (ref 0–5)

## 2013-11-06 LAB — SALICYLATE LEVEL: Salicylates, Serum: <1.7 mg/dL

## 2013-11-06 LAB — MAGNESIUM: Magnesium: 1.8 mg/dL

## 2013-11-06 LAB — TROPONIN I: Troponin-I: <0.02 ng/mL

## 2013-11-06 LAB — ACETAMINOPHEN LEVEL: Acetaminophen: <2 ug/mL

## 2013-11-07 LAB — CBC WITH DIFFERENTIAL/PLATELET
Basophil #: 0 10*3/uL (ref 0.0–0.1)
Basophil %: 0.1 %
Eosinophil #: 0 10*3/uL (ref 0.0–0.7)
Eosinophil %: 0 %
HCT: 39 % (ref 35.0–47.0)
HGB: 13.2 g/dL (ref 12.0–16.0)
LYMPHS PCT: 2 %
Lymphocyte #: 0.3 10*3/uL — ABNORMAL LOW (ref 1.0–3.6)
MCH: 30.8 pg (ref 26.0–34.0)
MCHC: 33.8 g/dL (ref 32.0–36.0)
MCV: 91 fL (ref 80–100)
Monocyte #: 0.4 x10 3/mm (ref 0.2–0.9)
Monocyte %: 3.1 %
Neutrophil #: 12.4 10*3/uL — ABNORMAL HIGH (ref 1.4–6.5)
Neutrophil %: 94.8 %
Platelet: 217 10*3/uL (ref 150–440)
RBC: 4.29 10*6/uL (ref 3.80–5.20)
RDW: 12.8 % (ref 11.5–14.5)
WBC: 13.1 10*3/uL — ABNORMAL HIGH (ref 3.6–11.0)

## 2013-11-07 LAB — BASIC METABOLIC PANEL
Anion Gap: 11 (ref 7–16)
BUN: 9 mg/dL (ref 7–18)
CHLORIDE: 107 mmol/L (ref 98–107)
Calcium, Total: 9.2 mg/dL (ref 8.5–10.1)
Co2: 20 mmol/L — ABNORMAL LOW (ref 21–32)
Creatinine: 0.8 mg/dL (ref 0.60–1.30)
EGFR (African American): >60
Glucose: 232 mg/dL — ABNORMAL HIGH (ref 65–99)
Osmolality: 282 (ref 275–301)
Potassium: 3.4 mmol/L — ABNORMAL LOW (ref 3.5–5.1)
Sodium: 138 mmol/L (ref 136–145)

## 2013-11-17 ENCOUNTER — Ambulatory Visit: Payer: Self-pay

## 2013-11-17 ENCOUNTER — Emergency Department: Payer: Self-pay | Admitting: Emergency Medicine

## 2013-12-21 DIAGNOSIS — M797 Fibromyalgia: Secondary | ICD-10-CM | POA: Insufficient documentation

## 2013-12-21 DIAGNOSIS — J302 Other seasonal allergic rhinitis: Secondary | ICD-10-CM | POA: Insufficient documentation

## 2014-08-13 ENCOUNTER — Ambulatory Visit: Payer: Self-pay | Admitting: Cardiology

## 2014-09-11 ENCOUNTER — Ambulatory Visit: Payer: Self-pay | Admitting: Family Medicine

## 2014-10-23 NOTE — H&P (Signed)
PATIENT NAME:  Brandi Bell, Brandi Bell MR#:  [id number] DATE OF BIRTH:  [birth year]  DATE OF ADMISSION:  02/01/2012  PRIMARY CARE PHYSICIAN: Benita Gutter, MD   ER REFERRING PHYSICIAN: Latina Craver, MD   CHIEF COMPLAINT: Trouble breathing.   HISTORY OF PRESENT ILLNESS: The patient is a 54 year old female patient with history of asthma and bipolar disorder, came in the Emergency Room because of shortness of breath. The patient has been having asthma flare-ups since yesterday, and she tried Duo-Neb yesterday at home, called EMS and they gave her one dose of breathing treatment. The patient was seen by EMS at home yesterday, and after breathing treatment they said she was fine. She was doing okay until this morning. Again asthma started to act, and she felt so short of breath and came to the Emergency Room. In the ER, the patient received a dose of epinephrine and also magnesium 2 grams and Solu-Medrol 60, and she also received Duo-Neb two times with no relief with significant shortness of breath and wheezing and tightness in the chest, so she was referred for admission.  The patient initially was on the verge of getting started on BiPAP, but she turned around after the breathing treatments and Solu-Medrol, and she is seen now and says that she is slightly better, but still wheezing and tightness in the chest is present. The patient right now is on 15 liters of oxygen and saturations are around 100%. She was intubated in 2008 and was at [facility] in [place] at that time. The patient also was admitted in September of 2012. At that time she saw Dr. Frazier Richards, but she says she is not seeing him anymore. She also was seen by Dr. Allyne Gee, MD, pulmonologist, for her asthma. She says that she does not get any attacks this very often, and she has no night symptoms. She uses Duo-Neb only when needed, but she is only on Advair   PAST MEDICAL HISTORY:  1. Restless leg syndrome. 2. Posttraumatic  stress disorder. 3. Asthma.  4. Hyperlipidemia.  5. Anxiety.  6. Migraines.   PAST SURGICAL HISTORY:  1. Tonsillectomy.  2. Cholecystectomy.  3. Partial hysterectomy, then had ovaries and tubes removed. 4. Cataract surgeries.  5. Bladder surgery. 6. Arthroscopic shoulder surgery.  7. Right arm tennis elbow surgery.  8. Septoplasty. 9. Foot surgery.   ALLERGIES: She has allergy to multiple antibiotics and other medications including Cipro, ampicillin, codeine, Depakote, Geodon, imipramine, morphine, Nubain, Percocet, sulfa.   SOCIAL HISTORY: No smoking. No drinking. No drugs. She is a retired Audiological scientist.   FAMILY HISTORY: Mother had asthma and hypertension, diabetes. Sister also had asthma. Daughter with diabetes and asthma.   HOME MEDICATIONS:  1. Advair Diskus 250/50, 1 puff b.i.d.  2. Aspirin 81 mg daily. 3. Atorvastatin 10 mg, takes 1/2 tablet daily.  4. Klonopin 1 mg p.o. b.i.d.  5. Estradiol 1 mg, takes 1/2 tablet daily.  6. Hydromorphone 2 mg every 6 hours as needed.  7. Nexium 40 mg daily. 8. Nitrofurantoin macrocrystals 100 mg p.o. b.i.d. That was started on July 23rd.  9. Zofran as needed.    REVIEW OF SYSTEMS: CONSTITUTIONAL: Denies any fever.  EYES: No blurred vision. History of cataract surgery. ENT: No tinnitus. No epistaxis. No difficulty swallowing. RESPIRATORY: Has severe tightness in the chest because of the asthma flare and having a very dry cough. Trouble breathing started since yesterday. CARDIOVASCULAR: Feels tight in the chest. Has no orthopnea, no PND. GASTROINTESTINAL: No  nausea. No vomiting. No abdominal pain. GENITOURINARY: No dysuria. ENDOCRINE: No polyuria or nocturia. INTEGUMENTARY: No skin rashes. MUSCULOSKELETAL: Complains of joint pain. NEUROLOGIC: No numbness or weakness. PSYCHIATRIC: The patient does have a history of posttraumatic stress disorder and bipolar disorder.   PHYSICAL EXAMINATION:  VITAL SIGNS: Temperature 98.6, pulse 117, respirations  16, blood pressure 126/64, saturations 100% 15 liters.   GENERAL: She is alert, awake, oriented, in moderate distress because of her asthma, and able to talk in full sentences but is having dry cough whenever she talks.   HEENT: Head atraumatic, normocephalic. Pupils are equally reacting to light. Extraocular movements are intact.  No conjunctivitis. Hearing is intact. Mucous membranes are dry.   NECK: Thyroid is not enlarged. No lymphadenopathy. No JVD. No carotid bruits.   RESPIRATORY: The patient has decreased air entry bilaterally and wheezing present, increased respiratory effort.   CARDIOVASCULAR: S1, S2 regular, tachycardic.   ABDOMEN: Soft, nontender, nondistended, not using accessory muscles or respiration.    MUSCULOSKELETAL: Strength five out of five upper and lower extremities.   SKIN: No skin rashes.   EXTREMITIES: No edema.   NEUROLOGICAL: Cranial nerves II through XII are intact. Power is 5/5 in upper and lower extremities. Sensation is intact. Deep tendon reflexes are 2+ bilaterally.  PSYCHIATRIC: Oriented to time, place, and person, slightly anxious.   LABORATORY, DIAGNOSTIC AND RADIOLOGICAL DATA: Electrolytes: Sodium 142, potassium 3.6, chloride 108, bicarbonate 21, BUN 11, creatinine 1, glucose 152. Liver  functions within normal limits. WBC 12.2, hemoglobin 15.3, hematocrit 44.7, platelets 291. Chest x-ray did not show any infiltrate. EKG showed sinus tachycardia with 128 beats. No ST-T changes. Bowel.   ASSESSMENT AND PLAN:  1. Acute severe asthma: The patient is going to be admitted to the Intensive Care Unit for impending respiratory failure, still has tightness and decreased air entry bilaterally. Oxygen saturation is 100% on 15 liters, but the patient will get extreme fatigue with respiratory effort and we may have to be intubated in case that happens; so we are going to admit her to the Intensive Care Unit an continue oxygen, Duo-Neb every 4 hours and high-dose  Solu-Medrol and Zithromax. The patient will be started on  Zithromax. Repeat chest x-ray tomorrow. Continue IV fluids.  2. GI prophylaxis and deep vein thrombosis prophylaxis.  3. Regarding her other medical problems, she will be continued on other medications including atorvastatin, clonazepam, and aspirin.   TIME SPENT ON HISTORY AND PHYSICAL: About 55 minutes.    ____________________________ Epifanio Lesches, MD sk:cbb D: 02/01/2012 18:57:29 ET T: 02/02/2012 06:59:00 ET JOB#: [id number]  cc: Epifanio Lesches, MD, <Dictator> Benita Gutter, MD  Epifanio Lesches MD ELECTRONICALLY SIGNED 02/19/2012 13:17

## 2014-10-23 NOTE — H&P (Signed)
PATIENT NAME:  Brandi Bell, Brandi Bell MR#:  [id number] DATE OF BIRTH:  Aug 14, 1960  DATE OF ADMISSION:  02/23/2012  REFERRING PHYSICIAN: ER physician, Dr. Michel Santee PRIMARY CARE PHYSICIAN: Dr. Meda Coffee  PULMONOLOGIST: Dr. Raul Del  CHIEF COMPLAINT: Acute respiratory failure exacerbation.   HISTORY OF PRESENT ILLNESS: Patient is a 54 year old female with past medical history of PTSD, restless leg syndrome, asthma who had been intubated in 2008 at [facility] in [place]. She was recently admitted to [facility] from 07/29 to 02/09/2012 for acute respiratory failure due to severe asthma which was managed on CPAP. Patient is currently intubated and majority of information was obtained from the patient's old medical records, the ED physician and patient's mother at bedside who is a poor historian. Patient had not been feeling well today and had been having increasing shortness of breath and was thinking about coming to the ED. She instead called Dr. Raul Del who advised her to come to his office. In the office she got two kind of shots by Dr. Raul Del and came home and was feeling okay for a short period of time following which she started developing wheezing, shortness of breath and called EMS. EMS had to put her on CPAP and transferred her to the ER where she was found to be in significant respiratory distress with poor air flow. She was emergently intubated in the Emergency Room and is currently on mechanical ventilator   CURRENT MEDICATIONS:  1. Advair 250/50, 1 puff b.i.d.  2. Abilify 15 mg daily.  3. Aspirin 81 mg daily.  4. Lipitor 10 mg, 0.5 tablets at bedtime.  5. Klonopin 1 mg b.i.d.  6. Estradiol 0.5 mg once a day. 7. Dilaudid 2 mg every six hours p.r.n. pain. 8. Nexium 40 mg daily.  9. Zofran 4 mg every six hours p.r.n.  10. Oxcarbazepine 150 mg b.i.d.   ALLERGIES: Ampicillin, Cipro, codeine, Depakote, Geodon, imipramine, morphine, Nubain, Percocet, sulfa drugs.    PAST MEDICAL HISTORY:  1. History  of asthma with recent admission for acute respiratory failure from 07/29 to 08/06.  2. She has been intubated in the past in 2008 when she was at [facility] in [place]. 3. Restless leg syndrome. 4. PTSD. 5. Hyperlipidemia. 6. Anxiety. 7. Migraines. 8. Hyperglycemia due to steroids.  9. Leukocytosis due to steroids. 10. Bradycardia related to vasovagal stimulation/increased tone due to coughing. 11. Hypertension which has responded to fluids in the past.    PAST SURGICAL HISTORY:  1. Tonsillectomy.  2. Cholecystectomy.  3. Partial hysterectomy followed by ovaries and tubes removal. 4. Cataract surgery. 5. Bladder surgery. 6. Arthroscopic shoulder surgery. 7. Right arm tennis elbow surgery.  8. Septoplasty. 9. Foot surgery.   SOCIAL HISTORY: No history of smoking, alcohol or drug abuse. 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  FAMILY HISTORY: Mother has asthma, hypertension, diabetes. Sister also has asthma. Daughter has diabetes and asthma.  REVIEW OF SYSTEMS: Currently the patient is intubated and sedated on propofol and is able to provide a review of systems.     PHYSICAL EXAMINATION:  VITAL SIGNS: Temperature 99.1, heart rate 112, respiratory rate 30, blood pressure 146/77, pulse ox 100%.   GENERAL: Patient is an obese Caucasian female who is currently intubated and sedated.   HEAD: Atraumatic, normocephalic.   EYES: There is no pallor, icterus, or cyanosis. Pupils are equal, round, and reactive to light and accommodation. Extraocular movements are intact.    ENT: Wet mucous membranes. She is orally intubated.  NECK: Supple. No masses. No JVD. No thyromegaly.   CHEST WALL: Not using accessory muscles of respiration. No intercostal muscle retractions.   LUNGS: Bilateral scattered wheezing. Poor air entry.   CARDIOVASCULAR: S1, S2 regular, tachycardic.   ABDOMEN: Soft. No guarding. No rigidity. Hypoactive bowel sounds. Organomegaly was  difficult to appreciate due to body habitus.   SKIN: No rashes or lesions.   PERIPHERIES: Patient has pedal edema, 1+ pedal pulses.   MUSCULOSKELETAL: No cyanosis or clubbing.   NEUROLOGIC: Patient is currently intubated and sedated and neurological exam was not able to be performed.   PSYCH: Unable to assess since patient is intubated and sedated.  LABORATORY, DIAGNOSTIC AND RADIOLOGICAL DATA: ABG showed pH 7.49, pCO2 27, pO2 462. CK and troponin normal. Glucose 138, BUN 11, creatinine 0.96, sodium 144, potassium 3.2, chloride 108, CO2 23; rest of CMP is normal. White count 14.5, hemoglobin 14.5, hematocrit 42.5, platelet count 342.   ASSESSMENT AND PLAN: 54 year old female with history of severe asthma, recent admission to [facility] for acute respiratory failure due to severe asthma, prior history of intubation presents with severe asthma exacerbation. 1. Acute respiratory failure due to acute asthma exacerbation. Patient is currently on mechanical ventilator and sedated. Will admit her to the Intensive Care Unit. Will start her on mechanical ventilation protocol. Obtain pulmonary consult. She will be on Combivent, Flovent and steroids. Will also start her on empiric antibiotics, doxycycline. 2. Hyperglycemia. This is likely reactive. Patient has no history of diabetes. In the past her hyperglycemia was related to steroid.  3. Hypokalemia. Will replace IV for the time being.  4. Leukocytosis, possible reactive. In the past her white count has been elevated due to steroids.  5. Hyperlipidemia. Will continue statin therapy.  6. History of PTSD and restless leg syndrome. Will continue her Abilify and oxcarbazepine.   Patient is critically ill with high risk of cardiopulmonary arrest and complications. Discussed with the mother. Reviewed all medical records, discussed with the ED physician, discussed with the patient's mother and aunt at bedside.   CRITICAL CARE TIME SPENT: 50 minutes.    ____________________________ Cherre Huger, MD sp:cms D: 02/23/2012 20:38:08 ET T: 02/24/2012 05:58:28 ET JOB#: [id number]  cc: Cherre Huger, MD, <Dictator> Laurice Record. Manor, MD Cherre Huger MD ELECTRONICALLY SIGNED 02/24/2012 13:09

## 2014-10-23 NOTE — Discharge Summary (Signed)
PATIENT NAME:  Brandi Bell, Brandi Bell MR#:  [id number] DATE OF BIRTH:  Nov 16, 1960  DATE OF ADMISSION:  02/01/2012 DATE OF DISCHARGE:  02/09/2012  REASON FOR ADMISSION: Acute respiratory failure due to severe asthma.   DISCHARGE DIAGNOSIS: Acute asthma exacerbation.    OTHER DIAGNOSES MANAGED DURING THIS HOSPITALIZATION:  1. Posttraumatic stress disorder.  2. Anxiety.  3. Hypotension, now resolved after fluids.  4. Migraines.  5. Hyperlipidemia.  6. Hyperglycemia due to steroids.  7. Increased white blood count also due to steroids.  8. Bradycardia due to vasal vagal increased tone due to cough.   IMPORTANT RESULTS: Blood sugars from 116 to 157 due to steroids. Creatinine from 0.65 to 0.5 at discharge. Sodium around 142. Cardiac enzymes negative. White blood cells 16.4, hemoglobin 14, platelets 227.  EKG normal sinus rhythm.   Chest x-ray at admission lungs are hypoinflated. No discrete focal pneumonia but basilar atelectasis. Small amount of pleural fluid blunting the costophrenic angles. Follow-up chest x-ray for clearance of findings.   DISCHARGE DISPOSITION: Home.  HOSPITAL COURSE: The patient is a 54 year old female who has history of asthma and also known bipolar disorder came to the Emergency Room due to shortness of breath. She has been concerned about her asthma flare-ups. She had one for two days prior to coming to the ER. She called EMS at home and they went to see her and gave her a neb treatment which improved her respiration and she decided to stay home. The next day she had worsening shortness of breath for which she came to the Emergency Room. In the Emergency Room she had a dose of epinephrine, magnesium 2 grams, and Solu-Medrol 60 mg. She received some DuoNebs with no significant relief of her shortness of breath and wheezing. She was put up to 50 liters of oxygen with saturations at 100%. She has been intubated in the past in 2008 at [facility] in [place] but this time  she was actually on the verge of being on a BiPAP at admission. The patient improved a couple of days after she needed to be put on a BiPAP. She was really tachypneic with respiratory rates in the 40's. BiPAP seems to work. There was high suspicion the patient was going to be intubated but she started turning the corner around August 1st. All this was also related to anxiety. The patient by August 2nd started to calm down, much improved. Her oxygen was down to 2 liters and her wheezing was improved as well. She was kept on IV steroids until the point that she lost her IV. At that moment we were able to change to p.o. steroids. At this moment she is able to ambulate keeping her oxygen sats above 95%. She is not getting dizzy or lightheaded at the moment. She was showing some signs of intervascular volume depletion with dizziness, lightheadedness, and events of tachycardia up to 140's whenever she was standing. That issue resolved after encouraging her hydration. At this moment she is okay to go home. Discharge instructions have been given to the patient and the family. Prescriptions have been written.  TIME SPENT: 35 minutes spent with patient and mother.   ____________________________ Greencastle Sink, MD rsg:drc D: 02/09/2012 14:13:51 ET T: 02/10/2012 11:32:17 ET JOB#: [id number]  cc: Hastings Sink, MD, <Dictator> Aleena Kirkeby America Brown MD ELECTRONICALLY SIGNED 02/20/2012 13:18

## 2014-10-23 NOTE — Discharge Summary (Signed)
PATIENT NAME:  Brandi Bell, Brandi Bell MR#:  [id number] DATE OF BIRTH:  20-Jan-1961  DATE OF ADMISSION:  02/23/2012 DATE OF DISCHARGE:  02/28/2012  CONSULTANT: Dr. Raul Del.   PRIMARY CARE PHYSICIAN: Dr. Ouida Sills from [facility].  CHIEF COMPLAINT: Respiratory failure, shortness of breath.   DISCHARGE DIAGNOSES:  1. Acute respiratory failure from asthma exacerbation.  2. Hypokalemia.  3. Possible obstructive sleep apnea.  4. History of asthma with recent admission for acute respiratory failure from 07/29 to 02/09/2012.  5. History of intubations in the past.  6. History of restless leg syndrome.  7. Post-traumatic stress disorder.   8. Hyperlipidemia.  9. Anxiety.  10. Migraines.  11. History of hyperglycemia secondary to steroids.  12. Leukocytosis due to steroids.  13. Bradycardia related to vasovagal stimulation in the past.   DISCHARGE MEDICATIONS:  1. Clonazepam 1 mg 1 tab twice a day.  2. Advair 250/50 mcg 1 puff two times a day.  3. Nexium 40 mg 1 cap daily.  4. Oxcarbazepine 150 mg 1 tab two times a day.  5. Zofran 4 mg 1 tablet 4 times a day as needed for nausea, vomiting.  6. Atorvastatin 10 mg 1/2 tab daily at bedtime.  7. Aspirin 81 mg daily.  8. Aripiprazole 15 mg 1 tab daily.  9. Estradiol 0.5 mg once a day.  10. Prednisone 40 mg daily for two days then 30 mg daily for two days then 20 mg daily for two days then 10 mg daily until seen by Dr. Raul Del on Thursday.  11. Combivent 100 mcg/20 mcg inhaled 1 puff 4 times a day.   DIET: Low sodium.   ACTIVITY: As tolerated.   FOLLOW UP: Please follow up with Dr. Raul Del on next Thursday as previously scheduled and follow with your primary care physician within 1 to 2 weeks.   DISPOSITION: Home.   CODE STATUS: Patient is FULL CODE.   HISTORY OF PRESENT ILLNESS: For full details of the history and physical, please see the dictation on 02/23/2012 by Dr. Karsten Fells, but briefly this is a 54 year old Caucasian female with a  recent hospitalization for respiratory failure and severe asthma exacerbation managed on CPAP who presents with respiratory failure. She was emergently intubated in the ER and admitted to Critical Care Unit.   LABORATORY, DIAGNOSTIC AND RADIOLOGICAL DATA: Initial potassium 3.2, sodium 144. LFTs within normal limits. Troponin negative x1. Initial WBC 14.5, which trended down to 10 on 08/21. INR 0.8. Urinalysis not suggestive of infection. Initial ABG pH  7.49, pCO2 27, pO2 462. Initial x-ray of the chest did not show any focal parenchymal opacity, pleural effusion or pneumothorax.   HOSPITAL COURSE: Patient was admitted to the hospitalist service in the Critical Care Unit. Patient had been intubated in the ER. Patient had not been feeling well the day of admission with increased shortness of breath. Patient was evaluated by Dr. Raul Del while in the Critical Care Unit. She was extubated the following day. Patient had been started on doxycycline as she has multiple antibiotic allergies. Patient was also started on nebulizers and IV steroids. Slowly the steroids were tapered and patient is on 40 mg p.o. prednisone today. She has been ambulating in the hall without significant wheezing today and feels back to baseline. She has been having several hospitalizations and has been on prednisone for greater than 3 to 4 weeks. It was recommended that she not completely stop the prednisone until she sees Dr. Raul Del who can than arrange for a slow taper  down to prevent adrenal insufficiency. Patient did have some hypokalemia on arrival which were repleted. Her outpatient psych medications were continued. She was also going to be discharged on Combivent which has relieved some of her shortness of breath and wheezing here. It is possible she also has obstructive sleep apnea and outpatient evaluation and sleep study is recommended. At this time she will be followed with outpatient primary care physician, Dr. Ouida Sills, and Dr.  Raul Del.   ____________________________ Vivien Presto, MD sa:cms D: 02/28/2012 13:33:33 ET T: 02/28/2012 14:19:47 ET JOB#: [id number]  cc: Vivien Presto, MD, <Dictator> Erwin Ouida Sills, MD  Vivien Presto MD ELECTRONICALLY SIGNED 03/16/2012 0:16

## 2014-10-26 NOTE — H&P (Signed)
PATIENT NAME:  Brandi Bell, BURICH MR#:  [id number] DATE OF BIRTH:  12-Jan-1961  DATE OF ADMISSION:  03/18/2013  PRIMARY CARE PHYSICIAN:  Kerin Perna, MD  PULMONOLOGIST: Dr. Mina Marble at [place]: Shortness of breath and wheezing.   HISTORY OF PRESENTING ILLNESS: A 54 year old female patient with history of asthma, rheumatoid arthritis, chronic back pain, presented to the Emergency Room initially earlier today with complaints of 1 day of worsening shortness of breath and wheezing. The patient was given 2 doses of albuterol nebulizer along with a dose of prednisone and was sent home. The patient did feel better while leaving the Emergency Room, but later worsened and has returned to the ER. Two doses of nebulizer DuoNeb have been tried with the patient having shallow breathing,  tachypnea and decreased breath sounds with wheezing and the patient is being admitted with an asthma exacerbation failing outpatient treatment to hospitalist service. The patient has been trying her home nebulizers without any help.   The patient did see her primary care physician recently with no change in medications. Was doing well at the time.   The patient was last intubated at [facility] in [place] in 2008. Her last asthma exacerbation was in April of 2014.   PAST MEDICAL HISTORY:  1.  Restless leg syndrome.  2.  Posttraumatic stress disorder.  3.  Asthma.  4.  Hyperlipidemia.  5.  Anxiety.  6.  Migraines.  7.  Recently diagnosed rheumatoid arthritis on Plaquenil.   PAST SURGICAL HISTORY: Tonsillectomy, cholecystectomy, partial hysterectomy, cataract surgery and bladder surgery.   SOCIAL HISTORY: The patient does not smoke. No alcohol. No illicit drugs. Lives at home alone. Has a dog as a pet. She is a retired Audiological scientist.   FAMILY HISTORY: Mother had asthma, hypertension, diabetes.   REVIEW OF SYSTEMS:  CONSTITUTIONAL: Complains of some fatigue, weakness. No weight loss,  weight gain. EYES: No blurred vision, pain or redness.  ENT: No tinnitus, ear pain, hearing loss.  RESPIRATORY: Has a cough, which is dry, wheezing and shortness of breath with some chest tightness.  CARDIOVASCULAR: No chest pain, orthopnea, edema.  GASTROINTESTINAL: No nausea, vomiting, diarrhea, abdominal pain.  GENITOURINARY: No dysuria, hematuria, frequency.  ENDOCRINE: No polyuria, nocturia, thyroid problems.  HEMATOLOGIC/LYMPHATICS: No anemia, easy bruising, bleeding.  INTEGUMENTARY: No acne, rash, lesions.  MUSCULOSKELETAL: Has some pain in bilateral lower extremities since her recent sprain.  NEUROLOGIC: No focal numbness, weakness, seizures.  PSYCHIATRIC: Has anxiety and PTSD.   ALLERGIES: Include AMPICILLIN, CIPRO, CODEINE, DEPAKOTE, GEODON, IMIPRAMINE, MORPHINE, NUBAIN, PERCOCET, AND SULFA.   HOME MEDICATIONS: Include:   1.  Abilify 30 mg oral once a day. 2,  Albuterol 2 puffs inhaled 4 times a day as needed. 3.  Benzonatate 100 mg oral 3 times a day. 4.  Calcium with vitamin D 2 tablets once a day. 5.  Cholecalciferol 5000 international units oral once a day. 6.  Clonazepam 1 mg oral 2 times a day. 7.  Duloxetine 20 mg oral once a day. 8.  Hydroxychloroquine 200 mg oral 2 times a day. 9.  Carbamazepine 300 mg oral twice a day. 11, Protonix 40 mg oral once a day. 12. Rizatriptan 10 mg oral once a day as needed for headache. 13. Tramadol 100 mg oral once a day at bedtime. 14. Valtrex 5 mg oral once a day. 15. Zofran 8 mg oral every eight hours as needed for nausea or vomiting.   PHYSICAL EXAMINATION:  VITAL SIGNS: Temperature 98.1,  pulse of 103, blood pressure 126/74, saturating 98% on room air.  GENERAL: Obese Caucasian female patient lying in bed in significant respiratory distress with conversational dyspnea. Has audible wheezing.  PSYCHIATRIC: Alert and oriented x3, restless, anxious.  HEENT: Atraumatic, normocephalic. Oral mucosa moist and pink. No oral ulcers or  thrush. External ears and nose normal. No pallor. No icterus.  NECK: Supple. No thyromegaly. No palpable lymph nodes. Trachea midline. No carotid bruit, JVD.  CARDIOVASCULAR: S1, S2, tachycardic without any murmurs. No edema.  RESPIRATORY: Decreased air entry on both sides with prolonged expiration and wheezes. Using accessory muscles.  GASTROINTESTINAL: Soft abdomen, nontender. No hepatosplenomegaly palpable. Bowel sounds present.   GENITOURINARY: No CVA tenderness or bladder distention.  SKIN: Warm and dry. No petechiae, rash, ulcers.  MUSCULOSKELETAL: No joint swelling, redness, effusion of the large joints. Normal muscle tone.  NEUROLOGIC: Motor strength 5/5 in upper and lower extremities. Sensation to fine touch is intact all over.  LYMPHATICS: No cervical lymphadenopathy.   LAB STUDIES: Glucose of 104, BUN 7, creatinine 0.67, potassium 2.8. WBC 9.1, hemoglobin 13.6. D-dimer of 0.52. CT scan of the chest showing mild atelectasis and no PE.   EKG shows sinus tachycardia.   ASSESSMENT AND PLAN:  1.  Acute exacerbation of asthma in a patient who has tried multiple nebulizer treatments as outpatient and has returned to Emergency Room with worsening symptoms, significant wheezing and conversational dyspnea. We will admit for asthma exacerbation. Start her on IV steroids along with scheduled nebulizers. She will not need any antibiotics at this time. O2 as needed. The patient has been intubated in the past. Needs close monitoring. No PE on CT scan.  2.  The patient has had hyperglycemia in the past secondary to steroids. We will monitor for any hyperglycemia. 3.  Chest pain secondary to asthma exacerbation. We will check cardiac enzymes.  4.  Deep vein thrombosis prophylaxis with Lovenox.  5.  CODE STATUS: FULL CODE.   Time spent today on this case was 35 minutes. ____________________________ Leia Alf Dagmawi Venable, MD srs:sw D: 03/18/2013 21:08:00 ET T: 03/18/2013 21:26:57  ET JOB#: [id number]  cc: Kerin Perna, MD Baruc Tugwell R. Jihaad Bruschi, MD, <Dictator> Neita Carp MD ELECTRONICALLY SIGNED 03/19/2013 14:27

## 2014-10-26 NOTE — Discharge Summary (Signed)
PATIENT NAME:  Brandi Bell, Brandi Bell MR#:  [id number] DATE OF BIRTH:  1961-05-31  DATE OF ADMISSION:  03/18/2013 DATE OF DISCHARGE:  03/20/2013  ADMITTING DIAGNOSIS: Shortness of breath, chest pressure.  DISCHARGE DIAGNOSES:  1.  Shortness of breath and wheezing due to acute asthma exacerbation. 2.  Chest pain. Likely related to asthma exacerbation. No further chest pain with treatment of her asthma.  3.  Restless leg syndrome.  4.  Post traumatic stress disorder. 5.  Hyperlipidemia. 6.  Chronic anxiety. 7.  Migraines. 8.  Rheumatoid arthritis.  CONSULTANTS: None.  PERTINENT LABS AND EVALUATIONS:  Admitting glucose 99, BUN 7, creatinine 0.68, sodium 139, potassium 3.8, chloride 107, CO2 27, calcium 9.3. Lipase 208. LFTs showed an AST of 43. The rest of the LFTs were normal. WBC 9.0, hemoglobin 14.4, platelet count 256. Urinalysis was negative. D-dimer was 0.52.   CT per PE protocol showed negative for PE. No infiltrate. No abnormalities.  HOSPITAL COURSE: Please refer to H and P done by the admitting physician. The patient is a 54 year old white female with history of asthma, rheumatoid arthritis and chronic back pain presented to the ED with complaint of shortness of breath and wheezing. The patient was given nebulizers along with dose of prednisone and was discharged home. Came back with worsening shortness of breath. The patient had a CT per PE protocol which was negative for PE. She was admitted and treated for asthma exacerbation with serial nebs and steroids. The patient started to improve. She was also complaining of some chest pressure, which was felt to be atypical. Negative cardiac enzymes. No EKG changes. At this time, she is doing much better. Her shortness of breath is resolved. She is stable for discharge.   DISCHARGE MEDICATIONS:  1.  Clonazepam 1 mg 1 tablet p.o. b.i.d.  2.  Albuterol 2 puffs 4 times a day as needed. 3.  Tramadol 100 one tab p.o. at bedtime. 4.  Abilify 30  daily. 5.  Calcium plus vitamin D 2 tabs daily. 6.  Calciferol 5000 one tab p.o. daily. 7.  Protonix 40 daily. 8.  Tudorza 1 puff b.i.d.  9.  Valtrex 500 one tab p.o. daily. 10.  Zofran 8 mg q. 8 p.r.n.  11. Duloxetine 20 daily. 12.  Hydroxychloroquine 200 one tab p.o. b.i.d.  13.  Oxcarbazepine 300 five tabs a.m., one tab in the evening. 14.  Rizatriptan 10 mg 1 tab p.o. daily as needed. 15.  Benzonatate 100 one tab p.o. t.i.d.  16.  Tylenol 650 q. 4 p.r.n.   DISCHARGE DIET: Regular  DISCHARGE ACTIVITY: As tolerated.  DISCHARGE FOLLOWUP:  Follow up with primary MD in 1 to 2 weeks.   TIME SPENT ON DISCHARGE:  32 minutes. ____________________________ Lafonda Mosses Posey Pronto, MD shp:sb D: 03/21/2013 08:15:26 ET T: 03/21/2013 08:33:37 ET JOB#: [id number]  cc: Sherrita Riederer H. Posey Pronto, MD, <Dictator> Alric Seton MD ELECTRONICALLY SIGNED 03/24/2013 13:01

## 2014-10-27 NOTE — Discharge Summary (Signed)
PATIENT NAME:  Brandi Bell, Brandi Bell MR#:  [id number] DATE OF BIRTH:  [birth year]  DATE OF ADMISSION:  11/06/2013 DATE OF DISCHARGE:  11/08/2013  PRESENTING COMPLAINT:  Shortness of breath.   DISCHARGE DIAGNOSES:  1.  Asthma exacerbation, improved.  2.  Morbid obesity.  3.  Bipolar disorder.  4.  Saturation 91% on room air on exertion.    MEDICATIONS:  1.  Tramadol extended-release 100 mg p.o. daily at bedtime.  2.  Protonix 40 mg daily.  3.  Oxcarbazepine 300 mg b.i.d.  4.  Acetaminophen 325 mg 2 tablets 4 hours as needed.  5.  Clonazepam 1 mg 2 tablets b.i.d. 6.  Nuvigil 250 p.o. daily.  7.  Percocet 5/325 one to two every 6 hours as needed.  8.  Ativan 2 mg b.i.d.  9.  Combivent 1 puff 4 times a day as needed.  10.  Prednisone taper.   FOLLOWUP:  With your primary care physician, Dr. Hoy Morn, in 1 to 2 weeks.   LABORATORY DATA:   1.  White count is 13.1.  2.  UA negative for UTI.  3.  Chest x-ray negative for pneumonia.  4.  Magnesium 1.8.   BRIEF SUMMARY OF HOSPITAL COURSE:   1.  [name] is a 54 year old obese Caucasian female with past medical history of asthma, who comes in with increasing shortness of breath, admitted with acute asthma exacerbation. She was continued on IV Solu-Medrol, changed to p.o. steroids. She does not need any antibiotics. She has continued with DuoNebs, and oral Combivent was prescribed.  2.  Morbid obesity.  3.  History of bipolar disorder. Her home meds were continued.  4.  History of rheumatoid arthritis.  5.  Overall, hospital stay otherwise remained stable.   CODE STATUS:  The patient remained a FULL CODE.   TIME SPENT:  40 minutes.     ____________________________ Hart Rochester Posey Pronto, MD sap:ms D: 11/08/2013 15:14:46 ET T: 11/08/2013 23:11:26 ET JOB#: [id number]  cc: Toryn Dewalt A. Posey Pronto, MD, <Dictator> Kerin Perna, MD Ilda Basset MD ELECTRONICALLY SIGNED 11/26/2013 16:15

## 2014-10-27 NOTE — H&P (Signed)
PATIENT NAME:  Brandi Bell, LAHAM MR#:  [id number] DATE OF BIRTH:  Feb 01, 1961  DATE OF ADMISSION:  11/06/2013  PRIMARY CARE PHYSICIAN: Dr. Hortencia Pilar.  REFERRING PHYSICIAN: Dr. Hinda Kehr.   CHIEF COMPLAINT: Cough, shortness of breath.   HISTORY OF PRESENT ILLNESS: Brandi Bell is a 54 year old female with a history of asthma, obesity, depression, osteoarthritis, presented to the Emergency Department with complaints of cough, shortness of breath, started since this morning. The patient could not pinpoint any triggering factors. Has been having diffuse wheezing. Concerning this, came to the Emergency Department. In the Emergency Department, the patient received multiple breathing treatments, was also placed on BiPAP at some point; however, was weaned off the BiPAP. Chest x-ray does not show any obvious infiltrates. The patient states experienced some subjective fevers.   PAST MEDICAL HISTORY: 1. Rheumatoid arthritis. 2. Gastroesophageal reflux disease. 3. Migraine headaches. 4. Lumbar herniated disk.  5. History of depression with suicide attempt.  6. Kidney stones.  7. Vitamin D deficiency.  8. Osteopenia.  9. Restless leg syndrome.  10. Hyperlipidemia.  11. Bipolar disorder.  12. Schizophrenia.   PAST SURGICAL HISTORY:  1. Tonsillectomy.  2. Left and right shoulder surgeries. 3. Cholecystectomy. 4. Lens implant in the right eye.  5. Bilateral foot surgery x 3. 6. Total hysterectomy.   ALLERGIES:  1. MORPHINE. 2. CODEINE.  3. AMPICILLIN. 4. DEPAKOTE. 5. NUBAIN. 6. PERCOCET. 7. IMIPRAMINE.  8. GEODON. 9. CIPRO. 10. SULFA.   HOME MEDICATIONS:  1. Zofran 8 mg every 8 hours as needed. 2. Valtrex 500 mg once a day.  3. Caprice Renshaw Pressair 1 puff 2 times a day.  4. Tramadol  1 tablet once a day.  5. Rizatriptan 10 mg once a day. 6. Protonix 40 mg once a day. 7. Oxcarbazepine 0.5 mg in the morning and 1 tablet in the evening.  8. Hydroxychloroquine 200 mg 2 times a day. 9.  Duloxetine 20 mg once a day.  10. Clonazepam 1 mg 2 times a day.  11. Cholecalciferol 5000 units once a day. 12. Calcium with vitamin D 2 times a day.  13. Benzonatate 100 mg 3 times a day. 14. Albuterol 2 puffs 4 times a day.  15. Acetaminophen 325 at 2 tablets every 4 hours.  16. Abilify 30 mg once a day.   SOCIAL HISTORY: Denies smoking, drinking alcohol, or using illicit drugs.   FAMILY HISTORY: Osteoarthritis.   REVIEW OF SYSTEMS:  CONSTITUTIONAL: Experiencing generalized weakness.  EYES: No change in vision.  EARS, NOSE, THROAT: No change in hearing.  RESPIRATORY: Has cough, shortness of breath.  CARDIOVASCULAR: No chest pain, palpitations.  GASTROINTESTINAL: No nausea, vomiting, abdominal pain.  GENITOURINARY: No dysuria or hematuria. HEMATOLOGIC: No easy bruising or bleeding.  SKIN: No rash or lesions.  MUSCULOSKELETAL: Has osteoarthritis.  NEUROLOGIC: No weakness or numbness in any part of the body.   PHYSICAL EXAMINATION:  GENERAL: This is a well-built, well-nourished age-appropriate female lying in the bed, not in distress.  VITAL SIGNS: Temperature 98.2, pulse 128, blood pressure 134/67, respiratory rate of 24, oxygen saturation 97% on 2 liters of oxygen.  HEENT: Head normocephalic and atraumatic. No scleral icterus. Conjunctivae normal. Pupils equal and react to light. Extraocular movements are intact. Mucous membranes moist. .  NECK: Supple. No lymphadenopathy. No JVD. No carotid bruit.  CHEST: Has no focal tenderness. LUNGS: Bilateral wheezing, prolonged expiratory phase. However, there is a good air entry. HEART: S1, S2, regular, tachycardia. The patient is also anxious. No pedal edema.  Pulses 2+.  ABDOMEN: Obese. Bowel sounds present. Soft, nontender, nondistended. Could not appreciate any hepatosplenomegaly.  SKIN: No rash or lesions.  MUSCULOSKELETAL: Good range of motion in all the extremities.  NEUROLOGIC: The patient is alert, oriented to place, person, and  time. Cranial nerves II-XII intact. Motor 5/5 in upper and lower extremities.   LABORATORY DATA: Chest x-ray, 1 view portable: No low-volume. Otherwise no acute findings.   Magnesium 1.8. BMP is completely within normal limits. CBC is completely within normal limits. Troponin less than 0.02.   ASSESSMENT AND PLAN: Brandi Bell 54 year old female who comes to the Emergency Department with acute asthma exacerbation.  1. Acute asthma exacerbation. Continue with albuterol, schedule DuoNeb. Keep the patient on Zithromax.  2. Morbid obesity with a body mass index of 40. We have counseled the patient.  3. History of bipolar disorder. Continue with home medications of Abilify, benzonatate, clonazepam, and duloxetine. 4. History of rheumatoid arthritis. We will hold the hydroxychloroquine for now.  5. Keep the patient on deep vein thrombosis prophylaxis with Lovenox.   TIME SPENT: 50 minutes.    ____________________________ Monica Becton, MD pv:lt D: 11/06/2013 23:54:49 ET T: 11/07/2013 02:12:49 ET JOB#: [id number]  cc: Monica Becton, MD, <Dictator> Kerin Perna, MD Monica Becton MD ELECTRONICALLY SIGNED 11/08/2013 4:28

## 2014-11-19 ENCOUNTER — Encounter: Payer: Self-pay | Admitting: Physical Therapy

## 2014-11-19 ENCOUNTER — Ambulatory Visit: Payer: Medicare Other | Attending: Physician Assistant | Admitting: Physical Therapy

## 2014-11-19 DIAGNOSIS — M25562 Pain in left knee: Secondary | ICD-10-CM | POA: Diagnosis not present

## 2014-11-19 DIAGNOSIS — M25552 Pain in left hip: Secondary | ICD-10-CM | POA: Insufficient documentation

## 2014-11-19 DIAGNOSIS — M25561 Pain in right knee: Secondary | ICD-10-CM | POA: Diagnosis not present

## 2014-11-19 DIAGNOSIS — M25551 Pain in right hip: Secondary | ICD-10-CM | POA: Diagnosis not present

## 2014-11-19 DIAGNOSIS — R262 Difficulty in walking, not elsewhere classified: Secondary | ICD-10-CM | POA: Diagnosis not present

## 2014-11-19 NOTE — Therapy (Signed)
[facility] [address] [place], [place], [address] Phone: [phone]   Fax:  [phone]  Physical Therapy Evaluation  Patient Details  Name: Brandi Bell MRN: [id number] Date of Birth: 06-27-1961 Referring Provider:  Juventino Slovak, PA-C  Encounter Date: 11/19/2014      PT End of Session - 11/19/14 1636    Authorization - Visit Number 1   Authorization - Number of Visits 8      Past Medical History  Diagnosis Date  . Asthma   . Depression   . Suicide attempt   . Microhematuria   . History of migraine headaches   . GERD (gastroesophageal reflux disease)   . Rhinitis   . Bipolar disorder   . PTSD (post-traumatic stress disorder)   . Anxiety     Past Surgical History  Procedure Laterality Date  . Vaginal hysterectomy    . Tubaligation    . Tonsillectomy    . Tonsillectomy    . Cholecystectomy    . Foot surgery      multiple  . Septoplasty    . Anterior and posterior repair    . Bladder tact      There were no vitals filed for this visit.  Visit Diagnosis:  Difficulty in walking  Pain in hip, right  Pain in hip, left  Pain in right knee  Pain in left knee      Subjective Assessment - 11/19/14 1015    Subjective Pt. reports B hip pain is good "a 2/10" and B knee pain is 3/10.  Pt. unable to ascend/ descend normally and has to side step stairs.    Limitations Sitting;Standing;Walking   Patient Stated Goals decrease pain/ improve stair climbing/ begin a gym based ex. program.    Currently in Pain? Yes   Pain Score 2    Pain Location Knee   Pain Orientation Right;Left   Pain Type Chronic pain   Pain Radiating Towards B hip pain on/off over pat 3-4 years and knee pain started in 2/16.   Pain Onset More than a month ago   Pain Frequency Intermittent   Multiple Pain Sites Yes   Pain Score 3   Pain Location Hip   Pain Orientation Right;Left   Pain Type Chronic pain   Pain Onset More than a  month ago            [facility] PT Assessment - 11/19/14 0001    Assessment   Next MD Visit 12/04/14   Prior Therapy 2+ weeks ago at [facility] in [place]   Precautions   Precautions None   Restrictions   Weight Bearing Restrictions No   Balance Screen   Has the patient fallen in the past 6 months Yes   Is the patient reluctant to leave their home because of a fear of falling?  No   Home Environment   Living Enviornment Private residence   Prior Function   Level of Independence Independent with basic ADLs   Vocation On disability   Leisure pt. reports she wants to become more active   Palpation   Palpation --  tenderness to B ITB/ hips and B infrapatellar tendons.        Pt. Ambulates with good heel strike/ BOS/ consistent gait pattern.  Pt. Pronated gait pattern/ shoe wear noted.  Severe pain with stair climbing in B knees, esp. With descending steps/ eccentric distal quad control.  B LE AROM WNL and B strength  grossly 5/5 MMT (severe B hip cramping with resisted hip flexion/ abd.).  B hamstring flexibility WNL.  Piriformis muscle tightness/ trunk rotn./ ITB tightness bilaterally.  No LLD.  Significant tenderness with palpation to B ITB but not over trochanteric bursae region.  Cramping may be related to her current dosage of Gabapentin at night time for restless leg.  (-) anterior drawer/ varus and valgus testing.          PT Education - 2014/12/05 1250    Education provided Yes   Education Details HEP/ proper shoe wear/ icing   Person(s) Educated Patient   Methods Explanation;Demonstration;Handout   Comprehension Verbalized understanding;Returned demonstration           PT Long Term Goals - 12/05/2014 1256    PT LONG TERM GOAL #1   Title Pt. I with HEP to improve B hip strength to 5/5 MMT to improve pain-free walking/ stair climbing.     Baseline severe pain with stair climbing in B hips/ knees.    Time 4   Period Weeks   Status New   PT LONG TERM GOAL #2   Title  Pt. able to descend 4 steps with no c/o hip/knee pain with minimal use of B UE assist to improve pain-free mobility.    Baseline severe knee pain   Time 4   Period Weeks   Status New   PT LONG TERM GOAL #3   Title Pt. will report no tenderness in B hips/knees with palpation to improve pain-free mobility/ sleeping position on R side.     PT LONG TERM GOAL #4   Title Pt. able to complete a 30 min. gym based/ aquatic ex. program with no B hip/knee pain or limitations.    Baseline currently not exercising   Time 4   Period Weeks   Status New             Plan - 05-Dec-2014 1252    Clinical Impression Statement Pt. is a pleasant 54 y/o female with chronic h/o B hip pain and received steriod injections 1 week ago with benefit.  Pt. currently reports 2/10 B hip pain with increase activity.  Pt. is also reporting 3/10 B knee (infrapatellar tendon pain) with stair climbing and prolonge sitting/ standing tasks.  Pt. presents with good B hip/LE ROM and strength grossly 5/5 MMT except 4+/5 MMT hip flexion.  Pt. had several strong/ debilitating muscle cramps during MMT in seated/ supine position.  Pt. will benefit from skilled PT services to develop a home/gym based exercise program to improve pain-free mobility in B hips/knees with daily tasks.    Pt will benefit from skilled therapeutic intervention in order to improve on the following deficits Abnormal gait;Decreased endurance;Decreased strength;Difficulty walking;Decreased mobility;Decreased balance;Pain;Decreased range of motion   PT Frequency 2x / week   PT Duration 4 weeks   PT Treatment/Interventions Gait training;Neuromuscular re-education;Aquatic Therapy;Functional mobility training;Patient/family education;Cryotherapy;Therapeutic exercise;Manual techniques;Balance training   PT Next Visit Plan reassess HEP   PT Home Exercise Plan see handouts   Consulted and Agree with Plan of Care Patient          G-Codes - 12-05-14 1302    Functional  Assessment Tool Used gait/ pain/ LEFS   Functional Limitation Mobility: Walking and moving around   Mobility: Walking and Moving Around Current Status ([id number]) At least 20 percent but less than 40 percent impaired, limited or restricted       Problem List There are no active problems to display for  this patient.  Pura Spice, PT, DPT # [id number]   11/19/2014, 4:38 PM  [facility] [address] [place], [place], [address] Phone: [phone]   Fax:  [phone]

## 2014-11-21 ENCOUNTER — Ambulatory Visit: Payer: Medicare Other | Admitting: Physical Therapy

## 2014-11-21 DIAGNOSIS — M25561 Pain in right knee: Secondary | ICD-10-CM

## 2014-11-21 DIAGNOSIS — M25551 Pain in right hip: Secondary | ICD-10-CM

## 2014-11-21 DIAGNOSIS — R262 Difficulty in walking, not elsewhere classified: Secondary | ICD-10-CM

## 2014-11-21 DIAGNOSIS — M25552 Pain in left hip: Secondary | ICD-10-CM

## 2014-11-21 DIAGNOSIS — M25562 Pain in left knee: Secondary | ICD-10-CM

## 2014-11-21 NOTE — Therapy (Signed)
[facility] [address] [place], [place], [address] Phone: [phone]   Fax:  [phone]  Physical Therapy Treatment  Patient Details  Name: Brandi Bell MRN: [id number] Date of Birth: [birth year] Referring Provider:  Kirk Ruths, MD  Encounter Date: 11/21/2014      PT End of Session - 11/21/14 1738    Visit Number 2   Number of Visits 8   Date for PT Re-Evaluation 12/17/14   Authorization - Visit Number 2   Authorization - Number of Visits 8   PT Start Time 8756   PT Stop Time 4332   PT Time Calculation (min) 48 min   Activity Tolerance Other (comment);No increased pain   Behavior During Therapy [facility] for tasks assessed/performed      Past Medical History  Diagnosis Date  . Asthma   . Depression   . Suicide attempt   . Microhematuria   . History of migraine headaches   . GERD (gastroesophageal reflux disease)   . Rhinitis   . Bipolar disorder   . PTSD (post-traumatic stress disorder)   . Anxiety     Past Surgical History  Procedure Laterality Date  . Vaginal hysterectomy    . Tubaligation    . Tonsillectomy    . Tonsillectomy    . Cholecystectomy    . Foot surgery      multiple  . Septoplasty    . Anterior and posterior repair    . Bladder tact      There were no vitals filed for this visit.  Visit Diagnosis:  Difficulty in walking  Pain in hip, right  Pain in hip, left  Pain in right knee  Pain in left knee      Subjective Assessment - 11/21/14 1736    Subjective Pt. states she talked with MD about muscle cramps and had medications changed.  She is now taking Xanax 1 hour prior to physical activity and Gabapentin in AM to prevent cramping.  Minimal completion of HEP secondary to muscle cramping.    Limitations Sitting;Standing;Walking   Patient Stated Goals decrease pain/ improve stair climbing/ begin a gym based ex. program.    Currently in Pain? Yes   Pain Score 2    Pain  Location Knee   Pain Orientation Right;Left   Pain Type Chronic pain   Pain Score 2   Pain Location Hip   Pain Onset More than a month ago        OBJECTIVE:  Reviewed HEP in depth (see handouts)- good technique but cautious with static holds during stretching to avoid severity of cramping.  Scifit L4 10 min. (no charge/ no cramping reported).  Discussed UE stretches for forearm tendonitis.  Manual tx.: supine B LE/ lumbar stretches.      Pt response for medical necessity:  Decrease muscle cramping as compared to initial evaluation.  No c/o hip pain after tx.           PT Long Term Goals - 11/19/14 1256    PT LONG TERM GOAL #1   Title Pt. I with HEP to improve B hip strength to 5/5 MMT to improve pain-free walking/ stair climbing.     Baseline severe pain with stair climbing in B hips/ knees.    Time 4   Period Weeks   Status New   PT LONG TERM GOAL #2   Title Pt. able to descend 4 steps with no c/o hip/knee pain with minimal  use of B UE assist to improve pain-free mobility.    Baseline severe knee pain   Time 4   Period Weeks   Status New   PT LONG TERM GOAL #3   Title Pt. will report no tenderness in B hips/knees with palpation to improve pain-free mobility/ sleeping position on R side.     PT LONG TERM GOAL #4   Title Pt. able to complete a 30 min. gym based/ aquatic ex. program with no B hip/knee pain or limitations.    Baseline currently not exercising   Time 4   Period Weeks   Status New            Plan - 11/21/14 1739    Clinical Impression Statement Pt. had fewer episodes/ severity of muscle cramping during stretches but had to remain cautious/ gentle during manual tx. today.  Discussed proer shoewear.   Pt will benefit from skilled therapeutic intervention in order to improve on the following deficits Abnormal gait;Decreased endurance;Decreased strength;Difficulty walking;Decreased mobility;Decreased balance;Pain;Decreased range of motion   PT Frequency 2x  / week   PT Duration 4 weeks   PT Treatment/Interventions Gait training;Neuromuscular re-education;Aquatic Therapy;Functional mobility training;Patient/family education;Cryotherapy;Therapeutic exercise;Manual techniques;Balance training   PT Next Visit Plan discuss muscle cramps/ shoe wear   Consulted and Agree with Plan of Care Patient        Problem List There are no active problems to display for this patient.  Pura Spice, PT, DPT # [phone]   11/21/2014, 5:42 PM  [facility] [address] [place], [place], [address] Phone: [phone]   Fax:  [phone]

## 2014-11-24 ENCOUNTER — Ambulatory Visit
Admission: EM | Admit: 2014-11-24 | Discharge: 2014-11-24 | Disposition: A | Discharge status: Home / Self Care | Payer: Medicare Other | Attending: Family Medicine | Admitting: Family Medicine

## 2014-11-24 DIAGNOSIS — J069 Acute upper respiratory infection, unspecified: Secondary | ICD-10-CM

## 2014-11-24 DIAGNOSIS — J45901 Unspecified asthma with (acute) exacerbation: Secondary | ICD-10-CM

## 2014-11-24 MED ORDER — PREDNISONE 20 MG PO TABS: ORAL_TABLET | ORAL | Status: DC

## 2014-11-24 MED ORDER — METHYLPREDNISOLONE SODIUM SUCC 125 MG IJ SOLR
125.0000 mg | Freq: Once | INTRAMUSCULAR | Status: AC
  Administered 2014-11-24: 125 mg via INTRAMUSCULAR

## 2014-11-24 MED ORDER — HYDROCOD POLST-CPM POLST ER 10-8 MG/5ML PO SUER: 5.0000 mL | Freq: Two times a day (BID) | ORAL | Status: DC | PRN

## 2014-11-24 MED ORDER — AZITHROMYCIN 250 MG PO TABS: ORAL_TABLET | ORAL | Status: DC

## 2014-11-24 MED ORDER — LEVALBUTEROL HCL 1.25 MG/0.5ML IN NEBU
1.2500 mg | INHALATION_SOLUTION | Freq: Once | RESPIRATORY_TRACT | Status: AC
  Administered 2014-11-24: 1.25 mg via RESPIRATORY_TRACT

## 2014-11-24 NOTE — ED Notes (Signed)
Started 2 days with cough. In the last day has turned into "bronchospasm". Continuous coughing on assessment. Denies fever

## 2014-11-24 NOTE — Discharge Instructions (Signed)
Bronchospasm °A bronchospasm is a spasm or tightening of the airways going into the lungs. During a bronchospasm breathing becomes more difficult because the airways get smaller. When this happens there can be coughing, a whistling sound when breathing (wheezing), and difficulty breathing. Bronchospasm is often associated with asthma, but not all patients who experience a bronchospasm have asthma. °CAUSES  °A bronchospasm is caused by inflammation or irritation of the airways. The inflammation or irritation may be triggered by:  °· Allergies (such as to animals, pollen, food, or mold). Allergens that cause bronchospasm may cause wheezing immediately after exposure or many hours later.   °· Infection. Viral infections are believed to be the most common cause of bronchospasm.   °· Exercise.   °· Irritants (such as pollution, cigarette smoke, strong odors, aerosol sprays, and paint fumes).   °· Weather changes. Winds increase molds and pollens in the air. Rain refreshes the air by washing irritants out. Cold air may cause inflammation.   °· Stress and emotional upset.   °SIGNS AND SYMPTOMS  °· Wheezing.   °· Excessive nighttime coughing.   °· Frequent or severe coughing with a simple cold.   °· Chest tightness.   °· Shortness of breath.   °DIAGNOSIS  °Bronchospasm is usually diagnosed through a history and physical exam. Tests, such as chest X-rays, are sometimes done to look for other conditions. °TREATMENT  °· Inhaled medicines can be given to open up your airways and help you breathe. The medicines can be given using either an inhaler or a nebulizer machine. °· Corticosteroid medicines may be given for severe bronchospasm, usually when it is associated with asthma. °HOME CARE INSTRUCTIONS  °· Always have a plan prepared for seeking medical care. Know when to call your health care provider and local emergency services (911 in the [place] Know where you can access local emergency care. °· Only take medicines as  directed by your health care provider. °· If you were prescribed an inhaler or nebulizer machine, ask your health care provider to explain how to use it correctly. Always use a spacer with your inhaler if you were given one. °· It is necessary to remain calm during an attack. Try to relax and breathe more slowly.  °· Control your home environment in the following ways:   °¨ Change your heating and air conditioning filter at least once a month.   °¨ Limit your use of fireplaces and wood stoves. °¨ Do not smoke and do not allow smoking in your home.   °¨ Avoid exposure to perfumes and fragrances.   °¨ Get rid of pests (such as roaches and mice) and their droppings.   °¨ Throw away plants if you see mold on them.   °¨ Keep your house clean and dust free.   °¨ Replace carpet with wood, tile, or vinyl flooring. Carpet can trap dander and dust.   °¨ Use allergy-proof pillows, mattress covers, and box spring covers.   °¨ Wash bed sheets and blankets every week in hot water and dry them in a dryer.   °¨ Use blankets that are made of polyester or cotton.   °¨ Wash hands frequently. °SEEK MEDICAL CARE IF:  °· You have muscle aches.   °· You have chest pain.   °· The sputum changes from clear or white to yellow, green, gray, or bloody.   °· The sputum you cough up gets thicker.   °· There are problems that may be related to the medicine you are given, such as a rash, itching, swelling, or trouble breathing.   °SEEK IMMEDIATE MEDICAL CARE IF:  °· You have worsening wheezing and coughing even   after taking your prescribed medicines.   You have increased difficulty breathing.   You develop severe chest pain. MAKE SURE YOU:   Understand these instructions.  Will watch your condition.  Will get help right away if you are not doing well or get worse. Document Released: 06/25/2003 Document Revised: 06/27/2013 Document Reviewed: 12/12/2012 [facility] Patient Information 2015 [place], [place]. This information is not  intended to replace advice given to you by your health care provider. Make sure you discuss any questions you have with your health care provider.   Recommend going to ER if symptoms persist/worsen as discussed.

## 2014-11-24 NOTE — ED Provider Notes (Signed)
SUBJECTIVE:  Brandi Bell is a 54 y.o. female who complains of sore throat, nasal drainage, cough, wheezing for the last few days. Denies CP, N/V/D, abdominal pain, severe headache. Has tried OTC Medication without much relief. Has hx of asthma and used Duoneb 10-15 prior to coming in today. Wheezing has resolved now just has continuous cough.    OBJECTIVE: She appears well, vital signs are as noted.  General: NAD HEENT: mild pharyngeal erythema, no exudate, no erythema of TMs, no cervical LAD Respiratory: CTA B Cardiology: RRR Neurological: CN II-XII grossly intact   ASSESSMENT:  URI, Asthma Exacerbation  PLAN: Solumedrol IM and neb treatment given in office, improved in office,  Zithromax, oral Prednisone to start tomorrow, Claritin prn, Delysm prn during daytime, Tussionex prn at night, Tylenol/Motrin prn, inhaler/neb as prescribed, rest hydration, seek medical attention if symptoms persist or worsen.        Paulina Fusi, MD 11/24/14 1045

## 2014-11-26 ENCOUNTER — Ambulatory Visit: Payer: Medicare Other | Admitting: Physical Therapy

## 2014-11-28 ENCOUNTER — Ambulatory Visit: Payer: Medicare Other | Admitting: Physical Therapy

## 2014-11-28 DIAGNOSIS — M25552 Pain in left hip: Secondary | ICD-10-CM

## 2014-11-28 DIAGNOSIS — R262 Difficulty in walking, not elsewhere classified: Secondary | ICD-10-CM

## 2014-11-28 DIAGNOSIS — M25561 Pain in right knee: Secondary | ICD-10-CM

## 2014-11-28 DIAGNOSIS — M25551 Pain in right hip: Secondary | ICD-10-CM

## 2014-11-28 DIAGNOSIS — M25562 Pain in left knee: Secondary | ICD-10-CM

## 2014-11-28 NOTE — Therapy (Signed)
[facility] [address] [place], [place], [address] Phone: [phone]   Fax:  [phone]  Physical Therapy Treatment  Patient Details  Name: Brandi Bell MRN: [id number] Date of Birth: 04/15/1961 Referring Provider:  Kirk Ruths, MD  Encounter Date: 11/28/2014      PT End of Session - 11/28/14 1518    Visit Number 3   Number of Visits 8   Date for PT Re-Evaluation 12/17/14   Authorization - Visit Number 3   Authorization - Number of Visits 8   PT Start Time 1829   PT Stop Time 9371   PT Time Calculation (min) 45 min   Activity Tolerance No increased pain;Patient tolerated treatment well   Behavior During Therapy [facility] for tasks assessed/performed      Past Medical History  Diagnosis Date  . Asthma   . Depression   . Suicide attempt   . Microhematuria   . History of migraine headaches   . GERD (gastroesophageal reflux disease)   . Rhinitis   . Bipolar disorder   . PTSD (post-traumatic stress disorder)   . Anxiety     Past Surgical History  Procedure Laterality Date  . Vaginal hysterectomy    . Tubaligation    . Tonsillectomy    . Tonsillectomy    . Cholecystectomy    . Foot surgery      multiple  . Septoplasty    . Anterior and posterior repair    . Bladder tact      There were no vitals filed for this visit.  Visit Diagnosis:  Difficulty in walking  Pain in hip, right  Pain in hip, left  Pain in right knee  Pain in left knee      Subjective Assessment - 11/28/14 1500    Subjective Pt. reports muscle soreness from doing exercises but no pain.  Pt. arrived to PT with proper footwear from [employer].  Pt. also purchased stationary bike for home use.     Limitations Sitting;Standing;Walking   Patient Stated Goals decrease pain/ improve stair climbing/ begin a gym based ex. program.    Currently in Pain? No/denies        OBJECTIVE: Scifit L5 10 min. (no charge/ no cramping or  pain reported).  Resisted gait 2BTB 10x all 4-planes with min. UE assist for balance (slight discomfort in R hip with L lateral stepping).  TG B knee flexion 9x (difficulty/ muscle fatigue).  Supine hip flexor stretches on edge of blue mat table 2x each 30 sec. Holds.  Manual tx.: supine B LE/ lumbar stretches (15 min.) .    Pt response for medical necessity: Decrease muscle cramping as compared to initial evaluation. No c/o hip pain after tx.         PT Long Term Goals - 11/19/14 1256    PT LONG TERM GOAL #1   Title Pt. I with HEP to improve B hip strength to 5/5 MMT to improve pain-free walking/ stair climbing.     Baseline severe pain with stair climbing in B hips/ knees.    Time 4   Period Weeks   Status New   PT LONG TERM GOAL #2   Title Pt. able to descend 4 steps with no c/o hip/knee pain with minimal use of B UE assist to improve pain-free mobility.    Baseline severe knee pain   Time 4   Period Weeks   Status New   PT LONG  TERM GOAL #3   Title Pt. will report no tenderness in B hips/knees with palpation to improve pain-free mobility/ sleeping position on R side.     PT LONG TERM GOAL #4   Title Pt. able to complete a 30 min. gym based/ aquatic ex. program with no B hip/knee pain or limitations.    Baseline currently not exercising   Time 4   Period Weeks   Status New      Problem List There are no active problems to display for this patient.  Pura Spice, PT, DPT # [phone]   11/28/2014, 3:54 PM  [facility] [address] [place], [place], [address] Phone: [phone]   Fax:  [phone]

## 2014-12-05 ENCOUNTER — Encounter: Payer: Medicare Other | Admitting: Physical Therapy

## 2014-12-07 ENCOUNTER — Emergency Department
Admission: EM | Admit: 2014-12-07 | Discharge: 2014-12-07 | Disposition: A | Discharge status: Home / Self Care | Payer: Medicare Other | Attending: Emergency Medicine | Admitting: Emergency Medicine

## 2014-12-07 ENCOUNTER — Emergency Department: Payer: Medicare Other

## 2014-12-07 DIAGNOSIS — S93401A Sprain of unspecified ligament of right ankle, initial encounter: Secondary | ICD-10-CM | POA: Diagnosis not present

## 2014-12-07 DIAGNOSIS — Z79899 Other long term (current) drug therapy: Secondary | ICD-10-CM | POA: Diagnosis not present

## 2014-12-07 DIAGNOSIS — Y998 Other external cause status: Secondary | ICD-10-CM | POA: Insufficient documentation

## 2014-12-07 DIAGNOSIS — Z7952 Long term (current) use of systemic steroids: Secondary | ICD-10-CM | POA: Diagnosis not present

## 2014-12-07 DIAGNOSIS — Z880 Allergy status to penicillin: Secondary | ICD-10-CM | POA: Diagnosis not present

## 2014-12-07 DIAGNOSIS — Z7951 Long term (current) use of inhaled steroids: Secondary | ICD-10-CM | POA: Insufficient documentation

## 2014-12-07 DIAGNOSIS — W1830XA Fall on same level, unspecified, initial encounter: Secondary | ICD-10-CM | POA: Insufficient documentation

## 2014-12-07 DIAGNOSIS — Y9301 Activity, walking, marching and hiking: Secondary | ICD-10-CM | POA: Diagnosis not present

## 2014-12-07 DIAGNOSIS — Y9289 Other specified places as the place of occurrence of the external cause: Secondary | ICD-10-CM | POA: Insufficient documentation

## 2014-12-07 DIAGNOSIS — S99911A Unspecified injury of right ankle, initial encounter: Secondary | ICD-10-CM | POA: Diagnosis present

## 2014-12-07 NOTE — Discharge Instructions (Signed)
Ankle Sprain °An ankle sprain is an injury to the strong, fibrous tissues (ligaments) that hold the bones of your ankle joint together.  °CAUSES °An ankle sprain is usually caused by a fall or by twisting your ankle. Ankle sprains most commonly occur when you step on the outer edge of your foot, and your ankle turns inward. People who participate in sports are more prone to these types of injuries.  °SYMPTOMS  °· Pain in your ankle. The pain may be present at rest or only when you are trying to stand or walk. °· Swelling. °· Bruising. Bruising may develop immediately or within 1 to 2 days after your injury. °· Difficulty standing or walking, particularly when turning corners or changing directions. °DIAGNOSIS  °Your caregiver will ask you details about your injury and perform a physical exam of your ankle to determine if you have an ankle sprain. During the physical exam, your caregiver will press on and apply pressure to specific areas of your foot and ankle. Your caregiver will try to move your ankle in certain ways. An X-ray exam may be done to be sure a bone was not broken or a ligament did not separate from one of the bones in your ankle (avulsion fracture).  °TREATMENT  °Certain types of braces can help stabilize your ankle. Your caregiver can make a recommendation for this. Your caregiver may recommend the use of medicine for pain. If your sprain is severe, your caregiver may refer you to a surgeon who helps to restore function to parts of your skeletal system (orthopedist) or a physical therapist. °HOME CARE INSTRUCTIONS  °· Apply ice to your injury for 1-2 days or as directed by your caregiver. Applying ice helps to reduce inflammation and pain. °¨ Put ice in a plastic bag. °¨ Place a towel between your skin and the bag. °¨ Leave the ice on for 15-20 minutes at a time, every 2 hours while you are awake. °· Only take over-the-counter or prescription medicines for pain, discomfort, or fever as directed by  your caregiver. °· Elevate your injured ankle above the level of your heart as much as possible for 2-3 days. °· If your caregiver recommends crutches, use them as instructed. Gradually put weight on the affected ankle. Continue to use crutches or a cane until you can walk without feeling pain in your ankle. °· If you have a plaster splint, wear the splint as directed by your caregiver. Do not rest it on anything harder than a pillow for the first 24 hours. Do not put weight on it. Do not get it wet. You may take it off to take a shower or bath. °· You may have been given an elastic bandage to wear around your ankle to provide support. If the elastic bandage is too tight (you have numbness or tingling in your foot or your foot becomes cold and blue), adjust the bandage to make it comfortable. °· If you have an air splint, you may blow more air into it or let air out to make it more comfortable. You may take your splint off at night and before taking a shower or bath. Wiggle your toes in the splint several times per day to decrease swelling. °SEEK MEDICAL CARE IF:  °· You have rapidly increasing bruising or swelling. °· Your toes feel extremely cold or you lose feeling in your foot. °· Your pain is not relieved with medicine. °SEEK IMMEDIATE MEDICAL CARE IF: °· Your toes are numb or blue. °·   You have severe pain that is increasing. MAKE SURE YOU:   Understand these instructions.  Will watch your condition.  Will get help right away if you are not doing well or get worse. Document Released: 06/22/2005 Document Revised: 03/16/2012 Document Reviewed: 07/04/2011 [facility] Patient Information 2015 [place], [place]. This information is not intended to replace advice given to you by your health care provider. Make sure you discuss any questions you have with your health care provider.  Wear the stirrup splint as needed for support.  Follow-up with Dr. Felipa Evener as needed for continued symptoms. Rest, ice, elevate the  ankle for swelling control.

## 2014-12-07 NOTE — ED Notes (Signed)
AAOx3 skin warm and dry.  NAD.  Discharge instructions given  D/c home

## 2014-12-07 NOTE — ED Notes (Signed)
Pt sts she was walking down street when she fell.  Pt sts she heard pop from ankle.

## 2014-12-07 NOTE — ED Provider Notes (Signed)
[facility] Emergency Department Provider Note ____________________________________________  Time seen: 1710  I have reviewed the triage vital signs and the nursing notes.  HISTORY  Chief Complaint Ankle Injury  HPI Brandi Bell is a 54 y.o. female who reports to the ED with complaints of right ankle pain and swelling after rolling it this afternoon about 2:30 PM. She describes describes wearing a new pair of tennis shoes, when she inadvertently rolled out of the shoe causing her to note a popping sensation and lateral ankle pain and swelling immediately.She denies other injury or outright fall following her twisting accident.  Past Medical History  Diagnosis Date  . Asthma   . Depression   . Suicide attempt   . Microhematuria   . History of migraine headaches   . GERD (gastroesophageal reflux disease)   . Rhinitis   . Bipolar disorder   . PTSD (post-traumatic stress disorder)   . Anxiety    There are no active problems to display for this patient.  Past Surgical History  Procedure Laterality Date  . Vaginal hysterectomy    . Tubaligation    . Tonsillectomy    . Tonsillectomy    . Cholecystectomy    . Foot surgery      multiple  . Septoplasty    . Anterior and posterior repair    . Bladder tact    . Shoulder arthroscopy      Current Outpatient Rx  Name  Route  Sig  Dispense  Refill  . albuterol (PROVENTIL HFA;VENTOLIN HFA) 108 (90 BASE) MCG/ACT inhaler   Inhalation   Inhale 2 puffs into the lungs every 6 (six) hours as needed.         . ALPRAZolam (XANAX) 0.5 MG tablet   Oral   Take 0.5 mg by mouth at bedtime as needed for anxiety.         . ARIPiprazole (ABILIFY) 15 MG tablet   Oral   Take 20 mg by mouth daily.          Marland Kitchen atorvastatin (LIPITOR) 10 MG tablet      Takes 1/2 tablet daily at bedtime.         Marland Kitchen azithromycin (ZITHROMAX Z-PAK) 250 MG tablet      Use as directed on box.   6 each   0   .  chlorpheniramine-HYDROcodone (TUSSIONEX PENNKINETIC ER) 10-8 MG/5ML SUER   Oral   Take 5 mLs by mouth every 12 (twelve) hours as needed for cough.   120 mL   0   . clonazePAM (KLONOPIN) 1 MG tablet      0.5 mg. Takes 1/2 tablet twice daily.         Marland Kitchen esomeprazole (NEXIUM) 40 MG capsule   Oral   Take 40 mg by mouth daily.         Marland Kitchen estradiol (ESTRACE) 0.5 MG tablet   Oral   Take 0.5 mg by mouth daily.         . Fluticasone-Salmeterol (ADVAIR) 250-50 MCG/DOSE AEPB   Inhalation   Inhale 1 puff into the lungs as directed.         Marland Kitchen lisinopril (PRINIVIL,ZESTRIL) 10 MG tablet   Oral   Take 10 mg by mouth daily.         Marland Kitchen lisinopril (PRINIVIL,ZESTRIL) 20 MG tablet   Oral   Take 20 mg by mouth daily.         . pantoprazole (PROTONIX) 40 MG tablet  Oral   Take 40 mg by mouth daily.         . predniSONE (DELTASONE) 20 MG tablet      Take 2 tabs once daily for 4 days.   8 tablet   0   . ranitidine (ZANTAC) 150 MG capsule   Oral   Take 150 mg by mouth 2 (two) times daily.         . SUMAtriptan (IMITREX) 100 MG tablet   Oral   Take 100 mg by mouth as needed.         . traMADol (ULTRAM) 50 MG tablet   Oral   Take 50 mg by mouth at bedtime.         . valACYclovir (VALTREX) 500 MG tablet   Oral   Take 500 mg by mouth 2 (two) times daily as needed.          Allergies Amoxicillin; Ciprofloxacin; Codeine; Morphine and related; Percocet; and Sulfa antibiotics  Family History  Problem Relation Age of Onset  . Asthma Mother   . Cancer Mother   . Diabetes Mother   . Heart attack Father    Social History History  Substance Use Topics  . Smoking status: Never Smoker   . Smokeless tobacco: Not on file  . Alcohol Use: No   Review of Systems  Constitutional: Negative for fever. Eyes: Negative for visual changes. ENT: Negative for sore throat. Cardiovascular: Negative for chest pain. Respiratory: Negative for shortness of  breath. Gastrointestinal: Negative for abdominal pain, vomiting and diarrhea. Genitourinary: Negative for dysuria. Musculoskeletal: Negative for back pain. Right ankle pain & swelling Skin: Negative for rash. Neurological: Negative for headaches, focal weakness or numbness. ____________________________________________  PHYSICAL EXAM:  VITAL SIGNS: ED Triage Vitals  Enc Vitals Group     BP --      Pulse --      Resp --      Temp --      Temp src --      SpO2 --      Weight --      Height --      Head Cir --      Peak Flow --      Pain Score 12/07/14 1553 5     Pain Loc --      Pain Edu? --      Excl. in [place]? --    Constitutional: Alert and oriented. Well appearing and in no distress. Eyes: Conjunctivae are normal. PERRL. Normal extraocular movements. ENT   Head: Normocephalic and atraumatic.   Nose: No congestion/rhinnorhea.   Mouth/Throat: Mucous membranes are moist.   Neck: No stridor. Hematological/Lymphatic/Immunilogical: No cervical lymphadenopathy. Cardiovascular: Normal rate, regular rhythm.  Respiratory: Normal respiratory effort.No wheezes/rales/rhonchi. Gastrointestinal: Soft and nontender. No distention. Musculoskeletal: Right ankle with moderate lateral swelling and early ecchymosis about the malleolus. Normal range limited slightly by pain. Negative drawer. No calf or achilles tenderness. Neurologic:  Normal speech and language. No gross focal neurologic deficits are appreciated. Skin:  Skin is warm, dry and intact. No rash noted. Psychiatric: Mood and affect are normal. Patient exhibits appropriate insight and judgment. ____________________________________________   RADIOLOGY IMPRESSION: No evidence of fracture or dislocation. ____________________________________________  PROCEDURES  Ankle Stirrup applied by the Tech. ____________________________________________  INITIAL IMPRESSION / ASSESSMENT AND PLAN / ED COURSE  Grade II right ankle  sprain s/p inversion injury. Radiology results to the patient.  Follow-up with primary provider as needed. Rest, ice, and elevate.  Patient to  dose home meds as needed.  ___________________________________________  FINAL CLINICAL IMPRESSION(S) / ED DIAGNOSES  Final diagnoses:  Ankle sprain, right, initial encounter     Melvenia Needles, PA-C 12/07/14 1831  Lisa Roca, MD 12/08/14 [phone]

## 2014-12-11 ENCOUNTER — Ambulatory Visit (INDEPENDENT_AMBULATORY_CARE_PROVIDER_SITE_OTHER): Payer: Medicare Other | Admitting: Gastroenterology

## 2014-12-11 ENCOUNTER — Encounter: Payer: Self-pay | Admitting: Gastroenterology

## 2014-12-11 VITALS — BP 116/74 | HR 102 | Temp 98.2°F | Ht 61.0 in | Wt 215.0 lb

## 2014-12-11 DIAGNOSIS — R159 Full incontinence of feces: Secondary | ICD-10-CM

## 2014-12-11 DIAGNOSIS — K6289 Other specified diseases of anus and rectum: Secondary | ICD-10-CM

## 2014-12-11 DIAGNOSIS — R208 Other disturbances of skin sensation: Secondary | ICD-10-CM

## 2014-12-11 DIAGNOSIS — R748 Abnormal levels of other serum enzymes: Secondary | ICD-10-CM

## 2014-12-11 NOTE — Assessment & Plan Note (Signed)
This patient is a 54 year old woman who comes today with a history of rectal burning and stool incontinence of liquid stools. She also was found to have abnormal liver enzymes. The patient will have her lab sent off for possible causes of abnormal liver enzymes. She will also be set up for an ultrasound of the right upper quadrant. The patient denies drinking milk but states she eats cheese. The patient will also be set up for colonoscopy due to her change in bowel habits and rectal burning. The patient has been explained the plan and agrees with it. I have discussed risks & benefits which include, but are not limited to, bleeding, infection, perforation & drug reaction.  The patient agrees with this plan & written consent will be obtained.

## 2014-12-11 NOTE — Progress Notes (Signed)
Gastroenterology Consultation  Referring Provider:     Richrd Humbles, MD Primary Care Physician:  Richrd Humbles, MD Primary Gastroenterologist:  Dr. Allen Norris     Reason for Consultation:     Abnormal liver enzymes and stool incontinence.        HPI:   Brandi Bell is a 54 y.o. y/o female referred for consultation & management of  Stool incontinence and abnormal liver enzymes by Dr. Felipa Evener, Wynona Meals, MD.  This patient comes today for a history of two months of a change in bowel habits. The patient reports that now she is having symptoms of stool incontinence. She reports that she will have anal burning and she goes to wipe and she finds a significant amount of liquid stools. She denies any constipation or diarrhea prior to or after these episodes. She denies any unexplained weight loss. The patient was also noted to have a isolated increase ALT. She denies any alcohol abuse. The patient also denies any black stools or bloody stools associated with her bowel movements. There is also no report of any vomiting although she states that she has intermittent nausea.  Past Medical History  Diagnosis Date  . Asthma   . Depression   . Suicide attempt   . Microhematuria   . History of migraine headaches   . GERD (gastroesophageal reflux disease)   . Rhinitis   . Bipolar disorder   . PTSD (post-traumatic stress disorder)   . Anxiety     Past Surgical History  Procedure Laterality Date  . Vaginal hysterectomy    . Tubaligation    . Tonsillectomy    . Tonsillectomy    . Cholecystectomy    . Foot surgery      multiple  . Septoplasty    . Anterior and posterior repair    . Bladder tact    . Shoulder arthroscopy      Prior to Admission medications   Medication Sig Start Date End Date Taking? Authorizing Provider  albuterol (PROVENTIL HFA;VENTOLIN HFA) 108 (90 BASE) MCG/ACT inhaler Inhale 2 puffs into the lungs every 6 (six) hours as needed.   Yes Historical Provider, MD    ARIPiprazole (ABILIFY) 15 MG tablet Take 20 mg by mouth daily.    Yes Historical Provider, MD  clonazePAM (KLONOPIN) 1 MG tablet 0.5 mg. Takes 1/2 tablet twice daily.   Yes Historical Provider, MD  lamoTRIgine (LAMICTAL) 25 MG tablet Take 25 mg by mouth daily.   Yes Historical Provider, MD  lisinopril (PRINIVIL,ZESTRIL) 10 MG tablet Take 10 mg by mouth daily.   Yes Historical Provider, MD  pantoprazole (PROTONIX) 40 MG tablet Take 40 mg by mouth daily.   Yes Historical Provider, MD  traMADol (ULTRAM) 50 MG tablet Take 50 mg by mouth at bedtime.   Yes Historical Provider, MD  valACYclovir (VALTREX) 500 MG tablet Take 500 mg by mouth 2 (two) times daily as needed.   Yes Historical Provider, MD  ALPRAZolam Duanne Moron) 0.5 MG tablet Take 0.5 mg by mouth at bedtime as needed for anxiety.    Historical Provider, MD  atorvastatin (LIPITOR) 10 MG tablet Takes 1/2 tablet daily at bedtime.    Historical Provider, MD  azithromycin (ZITHROMAX Z-PAK) 250 MG tablet Use as directed on box. Patient not taking: Reported on 12/11/2014 11/24/14   Paulina Fusi, MD  chlorpheniramine-HYDROcodone [facility]) 10-8 MG/5ML SUER Take 5 mLs by mouth every 12 (twelve) hours as needed for cough. Patient not taking: Reported  on 12/11/2014 11/24/14   Paulina Fusi, MD  esomeprazole (NEXIUM) 40 MG capsule Take 40 mg by mouth daily.    Historical Provider, MD  estradiol (ESTRACE) 0.5 MG tablet Take 0.5 mg by mouth daily.    Historical Provider, MD  Fluticasone-Salmeterol (ADVAIR) 250-50 MCG/DOSE AEPB Inhale 1 puff into the lungs as directed.    Historical Provider, MD  lisinopril (PRINIVIL,ZESTRIL) 20 MG tablet Take 20 mg by mouth daily.    Historical Provider, MD  predniSONE (DELTASONE) 20 MG tablet Take 2 tabs once daily for 4 days. Patient not taking: Reported on 12/11/2014 11/24/14   Paulina Fusi, MD  ranitidine (ZANTAC) 150 MG capsule Take 150 mg by mouth 2 (two) times daily.    Historical Provider, MD  SUMAtriptan  (IMITREX) 100 MG tablet Take 100 mg by mouth as needed.    Historical Provider, MD    Family History  Problem Relation Age of Onset  . Asthma Mother   . Cancer Mother   . Diabetes Mother   . Heart attack Father      History  Substance Use Topics  . Smoking status: Never Smoker   . Smokeless tobacco: Not on file  . Alcohol Use: No    Allergies as of 12/11/2014 - Review Complete 12/11/2014  Allergen Reaction Noted  . Amoxicillin  05/23/2012  . Ciprofloxacin  05/23/2012  . Codeine  05/23/2012  . Morphine and related  05/23/2012  . Sulfa antibiotics  12/07/2014    Review of Systems:    All systems reviewed and negative except where noted in HPI.   Physical Exam:  BP 116/74 mmHg  Pulse 102  Temp(Src) 98.2 F (36.8 C) (Oral)  Ht 5\' 1"  (1.549 m)  Wt 215 lb (97.523 kg)  BMI 40.64 kg/m2 No LMP recorded. Patient has had a hysterectomy. Psych:  Alert and cooperative. Normal mood and affect. General:   Alert,  Well-developed, well-nourished, pleasant and cooperative in NAD Head:  Normocephalic and atraumatic. Eyes:  Sclera clear, no icterus.   Conjunctiva pink. Ears:  Normal auditory acuity. Nose:  No deformity, discharge, or lesions. Mouth:  No deformity or lesions,oropharynx pink & moist. Neck:  Supple; no masses or thyromegaly. Lungs:  Respirations even and unlabored.  Clear throughout to auscultation.   No wheezes, crackles, or rhonchi. No acute distress. Heart:  Regular rate and rhythm; no murmurs, clicks, rubs, or gallops. Abdomen:  Normal bowel sounds.  No bruits.  Soft, non-tender and non-distended without masses, hepatosplenomegaly or hernias noted.  No guarding or rebound tenderness.  Negative Carnett sign.   Rectal:  Deferred.  Msk:  Symmetrical without gross deformities.  Good, equal movement & strength bilaterally. Pulses:  Normal pulses noted. Extremities:  No clubbing or edema.  No cyanosis. Neurologic:  Alert and oriented x3;  grossly normal  neurologically. Skin:  Intact without significant lesions or rashes.  No jaundice. Lymph Nodes:  No significant cervical adenopathy. Psych:  Alert and cooperative. Normal mood and affect.  Imaging Studies: Dg Ankle Complete Right  12/07/2014   CLINICAL DATA:  Status post ankle injury, with right ankle pain. Initial encounter.  EXAM: RIGHT ANKLE - COMPLETE 3+ VIEW  COMPARISON:  None.  FINDINGS: There is no evidence of fracture or dislocation. The ankle mortise is intact; the interosseous space is within normal limits. No talar tilt or subluxation is seen. A plantar calcaneal spur is seen.  The joint spaces are preserved. Mild lateral soft tissue swelling is noted.  IMPRESSION: No evidence of fracture  or dislocation.   Electronically Signed   By: Garald Balding M.D.   On: 12/07/2014 17:37

## 2014-12-12 ENCOUNTER — Ambulatory Visit: Payer: Medicare Other | Attending: Physician Assistant | Admitting: Physical Therapy

## 2014-12-12 DIAGNOSIS — M25551 Pain in right hip: Secondary | ICD-10-CM | POA: Diagnosis present

## 2014-12-12 DIAGNOSIS — M25552 Pain in left hip: Secondary | ICD-10-CM

## 2014-12-12 DIAGNOSIS — M25561 Pain in right knee: Secondary | ICD-10-CM

## 2014-12-12 DIAGNOSIS — R262 Difficulty in walking, not elsewhere classified: Secondary | ICD-10-CM | POA: Diagnosis present

## 2014-12-12 DIAGNOSIS — M25562 Pain in left knee: Secondary | ICD-10-CM | POA: Diagnosis present

## 2014-12-12 NOTE — Therapy (Signed)
[facility] [address]. [place], [place], [address] Phone: [phone]   Fax:  [phone]  Physical Therapy Treatment  Patient Details  Name: Brandi Bell MRN: [id number] Date of Birth: 1960-08-28 Referring Provider:  Kirk Ruths, MD  Encounter Date: 12/12/2014      PT End of Session - 12/12/14 1538    Visit Number 4   Number of Visits 8   Date for PT Re-Evaluation 12/17/14   Authorization - Visit Number 4   Authorization - Number of Visits 8   PT Start Time 7062   PT Stop Time 3762   PT Time Calculation (min) 43 min   Activity Tolerance No increased pain;Patient tolerated treatment well   Behavior During Therapy [facility] for tasks assessed/performed      Past Medical History  Diagnosis Date  . Asthma   . Depression   . Suicide attempt   . Microhematuria   . History of migraine headaches   . GERD (gastroesophageal reflux disease)   . Rhinitis   . Bipolar disorder   . PTSD (post-traumatic stress disorder)   . Anxiety     Past Surgical History  Procedure Laterality Date  . Vaginal hysterectomy    . Tubaligation    . Tonsillectomy    . Tonsillectomy    . Cholecystectomy    . Foot surgery      multiple  . Septoplasty    . Anterior and posterior repair    . Bladder tact    . Shoulder arthroscopy      There were no vitals filed for this visit.  Visit Diagnosis:  Difficulty in walking  Pain in hip, right  Pain in hip, left  Pain in right knee  Pain in left knee      Subjective Assessment - 12/12/14 1500    Subjective Pt. reports 3/10 B hip pain currently and states she is having no muscle cramps.  Pt. using pool at [place] pool.  Pt. using ace bandage on R ankle after turning ankle (see ER noted).  See recent MD notes from GI MD.   Pt. states she is really depressed and is currenlty going through a break up.  Pt. seeing Psychiatrist 2x/week on an outpatient basis.      Limitations Sitting;Standing;Walking   Patient Stated Goals decrease pain/ improve stair climbing/ begin a gym based ex. program.    Currently in Pain? Yes   Pain Score 4    Pain Location Ankle   Pain Descriptors / Indicators Aching   Pain Onset More than a month ago   Multiple Pain Sites Yes   Pain Score 3   Pain Location Hip   Pain Orientation Right;Left       OBJECTIVE: Scifit L4 10 min. (warm-up/no charge/ no cramping but slight R ankle discomfort reported).  Walking around PT clinic with instruction on step pattern/ cadence (assessed R ankle stability/ heel strike).  Lateral 3" plinth step over 20x L/R with no UE assist.  Walking partial lunges 10x L/R with min. To no UE assist. Resisted gait 2BTB 10x forward/ backwards with min. UE assist for balance (slight discomfort in R ankle)- moderate fatigue reported.  Steps ups (step to/ recip. Pattern with use of B handrails) working on proper technique/ decreasing knee pain/ increase quad control. Supine hip flexor stretches on edge of blue mat table 2x each 30 sec. Holds.  Manual tx.: supine B LE/ lumbar stretches (12 min.) .  Pt response for medical necessity: R ankle pain limits higher level/ prolonged standing tasks.  Pt. Ambulates with slight R antalgic gait secondary to ankle discomfort.  Increase LE/hip flexibility.          PT Education - 12/12/14 1537    Education provided Yes   Education Details Pool exercises (independent)   Person(s) Educated Patient   Methods Explanation   Comprehension Verbalized understanding             PT Long Term Goals - 11/19/14 1256    PT LONG TERM GOAL #1   Title Pt. I with HEP to improve B hip strength to 5/5 MMT to improve pain-free walking/ stair climbing.     Baseline severe pain with stair climbing in B hips/ knees.    Time 4   Period Weeks   Status New   PT LONG TERM GOAL #2   Title Pt. able to descend 4 steps with no c/o hip/knee pain with minimal use of B  UE assist to improve pain-free mobility.    Baseline severe knee pain   Time 4   Period Weeks   Status New   PT LONG TERM GOAL #3   Title Pt. will report no tenderness in B hips/knees with palpation to improve pain-free mobility/ sleeping position on R side.     PT LONG TERM GOAL #4   Title Pt. able to complete a 30 min. gym based/ aquatic ex. program with no B hip/knee pain or limitations.    Baseline currently not exercising   Time 4   Period Weeks   Status New               Plan - 12/12/14 1539    Clinical Impression Statement Pt. very depressed with flat affect today t/o tx. session.  PT had to motivate and direct pt. t/o tx. session but able to complete hip/LE strengthening ex. in a pain tolerable range.  Pt. continues to be limited with B knee PFS with ascending/ descending stairs.     Pt will benefit from skilled therapeutic intervention in order to improve on the following deficits Abnormal gait;Decreased endurance;Decreased strength;Difficulty walking;Decreased mobility;Decreased balance;Pain;Decreased range of motion   Rehab Potential Good   PT Frequency 2x / week   PT Duration 4 weeks   PT Treatment/Interventions Gait training;Neuromuscular re-education;Aquatic Therapy;Functional mobility training;Patient/family education;Cryotherapy;Therapeutic exercise;Manual techniques;Balance training   PT Next Visit Plan progress LE/hip strengthening ex. program.   RECERT next visit   PT Home Exercise Plan continue with daily stretching/ icing   Consulted and Agree with Plan of Care Patient        Problem List Patient Active Problem List   Diagnosis Date Noted  . Abnormal liver enzymes 12/11/2014  . Rectal burning 12/11/2014  . Stool incontinence 12/11/2014    Pura Spice, PT, DPT # [phone]   12/12/2014, 3:41 PM  [facility] [address] [place], [place], [address] Phone: [phone]   Fax:  [phone]

## 2014-12-13 ENCOUNTER — Encounter: Payer: Self-pay | Admitting: *Deleted

## 2014-12-13 ENCOUNTER — Telehealth: Payer: Self-pay | Admitting: Gastroenterology

## 2014-12-13 LAB — ANTI-SMOOTH MUSCLE ANTIBODY, IGG: SMOOTH MUSCLE AB: 9 U (ref 0–19)

## 2014-12-13 LAB — ANA: Anti Nuclear Antibody(ANA): NEGATIVE

## 2014-12-13 LAB — MITOCHONDRIAL ANTIBODIES: Mitochondrial Ab: 8.9 Units (ref 0.0–20.0)

## 2014-12-13 LAB — ALPHA-1-ANTITRYPSIN: A-1 Antitrypsin: 135 mg/dL (ref 90–200)

## 2014-12-13 LAB — HEPATITIS A ANTIBODY, TOTAL: HEP A TOTAL AB: NEGATIVE

## 2014-12-13 LAB — HEPATITIS C ANTIBODY: Hep C Virus Ab: <0.1 s/co ratio (ref 0.0–0.9)

## 2014-12-13 LAB — HEPATITIS B SURFACE ANTIBODY,QUALITATIVE: Hep B Surface Ab, Qual: REACTIVE

## 2014-12-13 LAB — HEPATITIS B SURFACE ANTIGEN: Hepatitis B Surface Ag: NEGATIVE

## 2014-12-13 LAB — CERULOPLASMIN: CERULOPLASMIN: 35.5 mg/dL (ref 19.0–39.0)

## 2014-12-13 NOTE — Telephone Encounter (Signed)
Pt wants to cancel colonoscopy for Monday. She is sick. She would like a call back and resch.

## 2014-12-13 NOTE — Telephone Encounter (Signed)
Pt decided to schedule appt with Dr. Allen Norris due to elevated LFT's. Will schedule colonoscopy during this appt.

## 2014-12-13 NOTE — Telephone Encounter (Signed)
-----   Message from Brandi Bell sent at 12/10/2014 10:21 AM EDT ----- Regarding: Patient requesting call from nursing services From: Carlota Raspberry Sent: 11/26/2014  Historical Summary: 07/04/2013  Colon triage

## 2014-12-14 ENCOUNTER — Ambulatory Visit
Admission: RE | Admit: 2014-12-14 | Discharge: 2014-12-14 | Disposition: A | Discharge status: Home / Self Care | Payer: Medicare Other | Source: Ambulatory Visit | Attending: Gastroenterology | Admitting: Gastroenterology

## 2014-12-14 DIAGNOSIS — R748 Abnormal levels of other serum enzymes: Secondary | ICD-10-CM | POA: Insufficient documentation

## 2014-12-19 ENCOUNTER — Telehealth: Payer: Self-pay

## 2014-12-19 ENCOUNTER — Ambulatory Visit: Payer: Medicare Other | Admitting: Physical Therapy

## 2014-12-19 ENCOUNTER — Encounter: Payer: Self-pay | Admitting: Physical Therapy

## 2014-12-19 DIAGNOSIS — R262 Difficulty in walking, not elsewhere classified: Secondary | ICD-10-CM

## 2014-12-19 DIAGNOSIS — M25551 Pain in right hip: Secondary | ICD-10-CM

## 2014-12-19 DIAGNOSIS — M25552 Pain in left hip: Secondary | ICD-10-CM

## 2014-12-19 DIAGNOSIS — M25561 Pain in right knee: Secondary | ICD-10-CM

## 2014-12-19 DIAGNOSIS — M25562 Pain in left knee: Secondary | ICD-10-CM

## 2014-12-19 NOTE — Telephone Encounter (Signed)
-----   Message from Lucilla Lame, MD sent at 12/17/2014  2:03 PM EDT ----- Let the patient know that the labs did not show any cause for her abnormal liver enzymes. She should follow-up after she has her procedures in the office to recheck her liver enzymes.

## 2014-12-19 NOTE — Telephone Encounter (Signed)
Pt notified of results. Pt will schedule follow up after her procedure on July 1st.

## 2014-12-19 NOTE — Telephone Encounter (Signed)
Pt notified of results

## 2014-12-19 NOTE — Therapy (Signed)
[facility] [address] [place], [place], [address] Phone: [phone]   Fax:  [phone]  Physical Therapy Treatment  Patient Details  Name: Brandi Bell MRN: [id number] Date of Birth: 02-11-1961 Referring Provider:  Kirk Ruths, MD  Encounter Date: 12/19/2014      PT End of Session - 12/19/14 1558    Visit Number 5   Number of Visits 8   Date for PT Re-Evaluation 01/16/15   Authorization - Visit Number 5   Authorization - Number of Visits 8   PT Start Time 1501   PT Stop Time 4403   PT Time Calculation (min) 56 min   Activity Tolerance No increased pain;Patient tolerated treatment well   Behavior During Therapy [facility] for tasks assessed/performed      Past Medical History  Diagnosis Date  . Depression   . Suicide attempt   . Microhematuria   . History of migraine headaches   . GERD (gastroesophageal reflux disease)   . Rhinitis   . Bipolar disorder   . PTSD (post-traumatic stress disorder)   . Anxiety   . Arthritis     Feet, Hands  . Spinal stenosis of cervical region   . Bulging lumbar disc   . Shortness of breath dyspnea   . Elevated LFTs     having ultrasound [facility] - 12/14/14  . Restless leg syndrome   . Bipolar disorder   . Asthma     [facility] admission - on Bipap - 7/13 - report on paper chart  . PONV (postoperative nausea and vomiting)   . Motion sickness   . Hypertension     Echo -1/16 - report on paper chart    Past Surgical History  Procedure Laterality Date  . Vaginal hysterectomy    . Tubaligation    . Cholecystectomy    . Foot surgery      multiple  . Septoplasty    . Anterior and posterior repair    . Bladder tact    . Shoulder arthroscopy    . Tonsillectomy    . Cardiac catheterization  2008    [facility] - neg - report in paper chart    There were no vitals filed for this visit.  Visit Diagnosis:  Difficulty in walking - Plan: PT plan of care cert/re-cert  Pain in hip, right  - Plan: PT plan of care cert/re-cert  Pain in hip, left - Plan: PT plan of care cert/re-cert  Pain in right knee - Plan: PT plan of care cert/re-cert  Pain in left knee - Plan: PT plan of care cert/re-cert      Subjective Assessment - 12/19/14 1553    Subjective Pt. reports no hip pain today and states she has been sleeping alot (from noon to next day due to depression).  Pt. remains very depressed and has been seeing psychiatrist/psychologist each 2x/week.  Pt. states she may have to be admitted to hospital for depression if she is not feeling better soon. Pt. has been participating with pool ex. on a consistent basis.      Limitations Sitting;Standing;Walking   Patient Stated Goals decrease pain/ improve stair climbing/ begin a gym based ex. program.    Currently in Pain? No/denies   Multiple Pain Sites No       OBJECTIVE: Scifit L5 10 min. (warm-up/no charge/ no cramping but c/o B knee discomfort). Walking around PT clinic with instruction on step pattern/ cadence 3x. Standing hip add./ITB stretches  at 1st step 2x each.  TG knee flexion 15x (knee discomfort)/ heel raises 20x.  BOSUpartial lunges 10x L/R with min. To no UE assist in //-bars Personal assistant).  Lateral walking/ braiding at increase cadence with upright posture 10x in //-bar (no UE assist). Supine hip flexor stretches on edge of blue mat table 3x each 30 sec. Holds.  Manual tx.: supine distal hamstring stretches (active), seated piriformis stretches/ knee to chest in supine position during unilateral hip flexor stretch 4x each. Discussed/ demonstrated pool ex. Program.     Pt response for medical necessity:Progressing well with standing/hip ex. With decrease episodes of muscle cramping and no R hip tenderness/ moderate L hip greater trochanter tenderness.  Pt. Ambulating with more normalized gait pattern.          PT Long Term Goals - 01-04-15 1602    PT LONG TERM GOAL #1   Title Pt. I with HEP to  improve B hip strength to 5/5 MMT to improve pain-free walking/ stair climbing.     Baseline decrease pain noted during step ups/ recip. stair climbing.  4+/5 MMT   Time 4   Period Weeks   Status Partially Met   PT LONG TERM GOAL #2   Title Pt. able to descend 4 steps with no c/o hip/knee pain with minimal use of B UE assist to improve pain-free mobility.    Baseline moderate knee pain but no c/o hip pain at this time   Time 4   Period Weeks   Status Partially Met   PT LONG TERM GOAL #3   Title Pt. will report no tenderness in B hips/knees with palpation to improve pain-free mobility/ sleeping position on R side.     Baseline No R hip pain.  Moderate L hip pain.   Time 4   Period Weeks   Status Partially Met   PT LONG TERM GOAL #4   Title Pt. able to complete a 30 min. gym based/ aquatic ex. program with no B hip/knee pain or limitations.    Baseline participating with pool ex.   Time 4   Period Weeks   Status Partially Met           Plan - 2015/01/04 1600    Clinical Impression Statement Pt. worked really hard during PT tx. with encouragement/ guidance provided for proper technique/ increase reps.  Pts. past 2 tx. session have been limited by depression/ low energy levels.  Pt. c/o 2 episodes of L hip cramping/ tenderness during supine stretches but overall marked improvement in hip tenderness/ decr. cramping.  No c/o hip pain with higher level step ups/ BOSU ex./ braiding.   Pt will benefit from skilled therapeutic intervention in order to improve on the following deficits Abnormal gait;Decreased endurance;Decreased strength;Difficulty walking;Decreased mobility;Decreased balance;Pain;Decreased range of motion;Impaired flexibility   Rehab Potential Good   PT Frequency 1x / week   PT Duration 4 weeks   PT Treatment/Interventions Gait training;Neuromuscular re-education;Aquatic Therapy;Functional mobility training;Patient/family education;Cryotherapy;Therapeutic exercise;Manual  techniques;Balance training   PT Next Visit Plan Progress LE/hip strengthening/ issue pool ex.   PT Home Exercise Plan continue with daily stretching/ icing   Consulted and Agree with Plan of Care Patient          G-Codes - January 04, 2015 1614    Functional Assessment Tool Used gait/ pain/ LEFS   Functional Limitation Mobility: Walking and moving around   Mobility: Walking and Moving Around Current Status (E3154) At least 1 percent but less than 20 percent  impaired, limited or restricted   Mobility: Walking and Moving Around Goal Status [phone]) 0 percent impaired, limited or restricted      Problem List Patient Active Problem List   Diagnosis Date Noted  . Abnormal liver enzymes 12/11/2014  . Rectal burning 12/11/2014  . Stool incontinence 12/11/2014    Pura Spice, PT, DPT # [phone]   12/19/2014, 4:15 PM  [facility] [address] [place], [place], [address] Phone: [phone]   Fax:  [phone]

## 2014-12-19 NOTE — Telephone Encounter (Signed)
-----   Message from Lucilla Lame, MD sent at 12/17/2014  2:03 PM EDT ----- Let the patient know that the ultrasound did not show any cause for her abnormal liver enzymes.

## 2014-12-21 ENCOUNTER — Telehealth: Payer: Self-pay | Admitting: Urgent Care

## 2014-12-21 NOTE — Telephone Encounter (Signed)
Called patient back at both numbers. Mobile number is disconnected. Left VM on Home Phone for return phone call.

## 2014-12-21 NOTE — Telephone Encounter (Signed)
Spoke with patient. She has been having stool incontinence throughout the day and this is burning her buttocks. Stool is normal in color, having normal BM's everyday, and denies constipation and diarrhea.  Advised her to try OTC Zinc oxide to the irritated area on her buttocks to help with pain and irritation.  Encouraged to call back if this does not help over the weekend.  Pt states that Colonoscopy is scheduled for 01/04/15.

## 2014-12-21 NOTE — Telephone Encounter (Signed)
Pt is having stool incontinence that is getting worse. Burning sensation.  Please call pt with advise.

## 2014-12-26 ENCOUNTER — Ambulatory Visit: Payer: Medicare Other | Admitting: Physical Therapy

## 2014-12-28 ENCOUNTER — Ambulatory Visit
Admission: EM | Admit: 2014-12-28 | Discharge: 2014-12-28 | Disposition: A | Discharge status: Home / Self Care | Payer: Medicare Other | Attending: Internal Medicine | Admitting: Internal Medicine

## 2014-12-28 ENCOUNTER — Encounter: Payer: Self-pay | Admitting: Emergency Medicine

## 2014-12-28 DIAGNOSIS — R109 Unspecified abdominal pain: Secondary | ICD-10-CM

## 2014-12-28 DIAGNOSIS — R1012 Left upper quadrant pain: Secondary | ICD-10-CM | POA: Diagnosis not present

## 2014-12-28 DIAGNOSIS — J45909 Unspecified asthma, uncomplicated: Secondary | ICD-10-CM | POA: Insufficient documentation

## 2014-12-28 DIAGNOSIS — R7989 Other specified abnormal findings of blood chemistry: Secondary | ICD-10-CM | POA: Insufficient documentation

## 2014-12-28 DIAGNOSIS — G2581 Restless legs syndrome: Secondary | ICD-10-CM | POA: Diagnosis not present

## 2014-12-28 DIAGNOSIS — F319 Bipolar disorder, unspecified: Secondary | ICD-10-CM | POA: Insufficient documentation

## 2014-12-28 DIAGNOSIS — K219 Gastro-esophageal reflux disease without esophagitis: Secondary | ICD-10-CM | POA: Diagnosis not present

## 2014-12-28 DIAGNOSIS — F419 Anxiety disorder, unspecified: Secondary | ICD-10-CM | POA: Diagnosis not present

## 2014-12-28 DIAGNOSIS — F4310 Post-traumatic stress disorder, unspecified: Secondary | ICD-10-CM | POA: Diagnosis not present

## 2014-12-28 DIAGNOSIS — I10 Essential (primary) hypertension: Secondary | ICD-10-CM | POA: Diagnosis not present

## 2014-12-28 LAB — CBC WITH DIFFERENTIAL/PLATELET
BASOS PCT: 1 %
Basophils Absolute: 0.1 10*3/uL (ref 0–0.1)
EOS ABS: 0.1 10*3/uL (ref 0–0.7)
EOS PCT: 1 %
HEMATOCRIT: 45.4 % (ref 35.0–47.0)
HEMOGLOBIN: 15.2 g/dL (ref 12.0–16.0)
Lymphocytes Relative: 21 %
Lymphs Abs: 2.2 10*3/uL (ref 1.0–3.6)
MCH: 30.8 pg (ref 26.0–34.0)
MCHC: 33.5 g/dL (ref 32.0–36.0)
MCV: 92.1 fL (ref 80.0–100.0)
MONO ABS: 1 10*3/uL — AB (ref 0.2–0.9)
MONOS PCT: 9 %
Neutro Abs: 7.3 10*3/uL — ABNORMAL HIGH (ref 1.4–6.5)
Neutrophils Relative %: 68 %
Platelets: 266 10*3/uL (ref 150–440)
RBC: 4.93 MIL/uL (ref 3.80–5.20)
RDW: 13.1 % (ref 11.5–14.5)
WBC: 10.7 10*3/uL (ref 3.6–11.0)

## 2014-12-28 LAB — COMPREHENSIVE METABOLIC PANEL
ALT: 26 U/L (ref 14–54)
ANION GAP: 10 (ref 5–15)
AST: 18 U/L (ref 15–41)
Albumin: 4 g/dL (ref 3.5–5.0)
Alkaline Phosphatase: 89 U/L (ref 38–126)
BUN: 14 mg/dL (ref 6–20)
CALCIUM: 9.5 mg/dL (ref 8.9–10.3)
CO2: 26 mmol/L (ref 22–32)
Chloride: 101 mmol/L (ref 101–111)
Creatinine, Ser: 0.74 mg/dL (ref 0.44–1.00)
GFR calc Af Amer: >60 mL/min (ref >60)
GFR calc non Af Amer: >60 mL/min (ref >60)
Glucose, Bld: 96 mg/dL (ref 65–99)
POTASSIUM: 4 mmol/L (ref 3.5–5.1)
SODIUM: 137 mmol/L (ref 135–145)
TOTAL PROTEIN: 7.4 g/dL (ref 6.5–8.1)
Total Bilirubin: 0.6 mg/dL (ref 0.3–1.2)

## 2014-12-28 LAB — URINALYSIS COMPLETE WITH MICROSCOPIC (ARMC ONLY)
Bacteria, UA: NONE SEEN — AB
Bilirubin Urine: NEGATIVE
Glucose, UA: NEGATIVE mg/dL
Hgb urine dipstick: NEGATIVE
KETONES UR: NEGATIVE mg/dL
LEUKOCYTES UA: NEGATIVE
NITRITE: NEGATIVE
PH: 5.5 (ref 5.0–8.0)
PROTEIN: NEGATIVE mg/dL
RBC / HPF: NONE SEEN RBC/hpf (ref <3)
Specific Gravity, Urine: 1.015 (ref 1.005–1.030)

## 2014-12-28 MED ORDER — TAMSULOSIN HCL 0.4 MG PO CAPS: 0.4000 mg | ORAL_CAPSULE | Freq: Every day | ORAL | Status: DC

## 2014-12-28 MED ORDER — PHENAZOPYRIDINE HCL 200 MG PO TABS: 200.0000 mg | ORAL_TABLET | Freq: Three times a day (TID) | ORAL | Status: DC

## 2014-12-28 MED ORDER — HYDROCODONE-ACETAMINOPHEN 5-325 MG PO TABS: 1.0000 | ORAL_TABLET | Freq: Four times a day (QID) | ORAL | Status: DC | PRN

## 2014-12-28 MED ORDER — KETOROLAC TROMETHAMINE 60 MG/2ML IM SOLN
60.0000 mg | Freq: Once | INTRAMUSCULAR | Status: AC
  Administered 2014-12-28: 60 mg via INTRAMUSCULAR

## 2014-12-28 NOTE — ED Provider Notes (Addendum)
CSN: [id number]     Arrival date & time 12/28/14  1209 History   First MD Initiated Contact with Patient 12/28/14 1307     Chief Complaint  Patient presents with  . 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  Had percocet left over from previous kidney stones took approximately 2 hours ago slightly helped but still having left lumbar cramping urinating without difficulty.  Colonoscopy scheduled for next week having rectal prolapse recurring with urination  Patient is a 54 y.o. female presenting with flank pain. The history is provided by the patient.  Flank Pain This is a recurrent problem. The current episode started 3 to 5 hours ago. The problem occurs constantly. The problem has been gradually worsening. Pertinent negatives include no chest pain, no abdominal pain, no headaches and no shortness of breath. The symptoms are aggravated by exertion and walking. Nothing relieves the symptoms. She has tried rest, food and water (percocet) for the symptoms. The treatment provided mild relief.    Past Medical History  Diagnosis Date  . Depression   . Suicide attempt   . Microhematuria   . History of migraine headaches   . GERD (gastroesophageal reflux disease)   . Rhinitis   . Bipolar disorder   . PTSD (post-traumatic stress disorder)   . Anxiety   . Arthritis     Feet, Hands  . Spinal stenosis of cervical region   . Bulging lumbar disc   . Shortness of breath dyspnea   . Elevated LFTs     having ultrasound [facility] - 12/14/14  . Restless leg syndrome   . Bipolar disorder   . Asthma     [facility] admission - on Bipap - 7/13 - report on paper chart  . PONV (postoperative nausea and vomiting)   . Motion sickness   . Hypertension      Echo -1/16 - report on paper chart   Past Surgical History  Procedure Laterality Date  . Vaginal hysterectomy    . Tubaligation    . Cholecystectomy    . Foot surgery      multiple  . Septoplasty    . Anterior and posterior repair    . Bladder tact    . Shoulder arthroscopy    . Tonsillectomy    . Cardiac catheterization  2008    [facility] - neg - report in paper chart   Family History  Problem Relation Age of Onset  . Asthma Mother   . Cancer Mother   . Diabetes Mother   . Heart attack Father    History  Substance Use Topics  . Smoking status: Never Smoker   . Smokeless tobacco: Not on file  . Alcohol Use: No   OB History    No data available     Review of Systems  Constitutional: Negative for fever, chills, diaphoresis, activity change, appetite change, fatigue and unexpected weight change.  HENT: Negative for congestion, dental problem, drooling, ear pain and mouth sores.   Eyes: Negative for photophobia, pain, discharge, redness, itching and visual disturbance.  Respiratory: Negative for cough, shortness of breath, wheezing and stridor.   Cardiovascular: Positive for palpitations. Negative for chest pain and leg swelling.  Gastrointestinal: Negative for nausea, vomiting, abdominal pain, diarrhea, constipation, blood in stool and rectal pain.  Endocrine:  Negative for cold intolerance and heat intolerance.  Genitourinary: Positive for flank pain. Negative for dysuria, urgency, frequency, hematuria, decreased urine volume, vaginal bleeding and difficulty urinating.  Musculoskeletal: Negative for myalgias, back pain, joint swelling, arthralgias, gait problem, neck pain and neck stiffness.  Skin: Negative for color change, pallor, rash and wound.  Allergic/Immunologic: Negative for environmental allergies, food allergies and immunocompromised state.  Neurological: Negative for dizziness, tremors, seizures, syncope, facial asymmetry, speech difficulty, weakness,  light-headedness, numbness and headaches.  Hematological: Negative for adenopathy. Does not bruise/bleed easily.  Psychiatric/Behavioral: Negative for behavioral problems, confusion, sleep disturbance and agitation.    Allergies  Ciprofloxacin; Amoxicillin; Codeine; Morphine and related; Pineapple; Sulfa antibiotics; and Tape  Home Medications   Prior to Admission medications   Medication Sig Start Date End Date Taking? Authorizing Provider  albuterol (PROVENTIL HFA;VENTOLIN HFA) 108 (90 BASE) MCG/ACT inhaler Inhale 2 puffs into the lungs every 6 (six) hours as needed.   Yes Historical Provider, MD  ARIPiprazole (ABILIFY) 15 MG tablet Take 20 mg by mouth daily. AM   Yes Historical Provider, MD  clonazePAM (KLONOPIN) 1 MG tablet 0.5 mg. Takes 1 tablet twice daily.   Yes Historical Provider, MD  fluticasone (FLONASE) 50 MCG/ACT nasal spray Place into both nostrils daily.   Yes Historical Provider, MD  lamoTRIgine (LAMICTAL) 25 MG tablet Take 50 mg by mouth daily. AM   Yes Historical Provider, MD  lisinopril (PRINIVIL,ZESTRIL) 10 MG tablet Take 10 mg by mouth daily. AM   Yes Historical Provider, MD  oxyCODONE-acetaminophen (PERCOCET) 10-325 MG per tablet Take 1 tablet by mouth every 4 (four) hours as needed for pain.   Yes Historical Provider, MD  pantoprazole (PROTONIX) 40 MG tablet Take 40 mg by mouth daily. AM   Yes Historical Provider, MD  rizatriptan (MAXALT) 10 MG tablet Take 10 mg by mouth as needed for migraine. May repeat in 2 hours if needed   Yes Historical Provider, MD  valACYclovir (VALTREX) 500 MG tablet Take 500 mg by mouth at bedtime as needed.    Yes Historical Provider, MD  Fluticasone-Salmeterol (ADVAIR) 250-50 MCG/DOSE AEPB Inhale 1 puff into the lungs as directed.    Historical Provider, MD  HYDROcodone-acetaminophen (NORCO/VICODIN) 5-325 MG per tablet Take 1 tablet by mouth every 6 (six) hours as needed for moderate pain or severe pain. 12/28/14   Olen Cordial, NP   phenazopyridine (PYRIDIUM) 200 MG tablet Take 1 tablet (200 mg total) by mouth 3 (three) times daily. 12/28/14   Olen Cordial, NP  tamsulosin (FLOMAX) 0.4 MG CAPS capsule Take 1 capsule (0.4 mg total) by mouth daily. 12/28/14   Aura Fey Betancourt, NP   BP 132/74 mmHg  Pulse 96  Temp(Src) 97.7 F (36.5 C) (Tympanic)  Ht 5\' 1"  (1.549 m)  Wt 210 lb (95.255 kg)  BMI 39.70 kg/m2  SpO2 96% Physical Exam  Constitutional: She is oriented to person, place, and time. Vital signs are normal. She appears well-developed and well-nourished.  HENT:  Head: Normocephalic and atraumatic.  Right Ear: External ear normal.  Left Ear: External ear normal.  Nose: Nose normal.  Mouth/Throat: Oropharynx is clear and moist. No oropharyngeal exudate.  Eyes: Conjunctivae, EOM and lids are normal. Pupils are equal, round, and reactive to light. Right eye exhibits no discharge. Left eye exhibits no discharge. No scleral icterus.  Neck: Trachea normal and normal range of motion. Neck supple. No tracheal deviation present. No thyromegaly present.  Cardiovascular: Normal rate, regular rhythm, normal heart sounds  and intact distal pulses.  Exam reveals no gallop and no friction rub.   No murmur heard. Pulmonary/Chest: Effort normal and breath sounds normal. No stridor. No respiratory distress. She has no wheezes. She has no rales. She exhibits no tenderness.  Abdominal: Soft. Bowel sounds are normal. She exhibits no shifting dullness, no distension, no pulsatile liver, no fluid wave, no abdominal bruit, no ascites, no pulsatile midline mass and no mass. There is no tenderness. There is no rebound, no guarding and no CVA tenderness.  Dull to percussion x 4 quads  Musculoskeletal: Normal range of motion. She exhibits no edema or tenderness.  Lymphadenopathy:    She has no cervical adenopathy.  Neurological: She is alert and oriented to person, place, and time. She has normal reflexes. She displays normal reflexes. No  cranial nerve deficit. She exhibits normal muscle tone. Coordination normal.  Skin: Skin is warm, dry and intact. No rash noted. No erythema. No pallor.  Psychiatric: She has a normal mood and affect. Her speech is normal and behavior is normal. Judgment and thought content normal. Cognition and memory are normal.  Nursing note and vitals reviewed.   ED Course  Procedures (including critical care time) Labs Review Labs Reviewed  URINALYSIS COMPLETEWITH MICROSCOPIC ([facility] ONLY) - Abnormal; Notable for the following:    Bacteria, UA NONE SEEN (*)    Squamous Epithelial / LPF 0-5 (*)    All other components within normal limits  CBC WITH DIFFERENTIAL/PLATELET - Abnormal; Notable for the following:    Neutro Abs 7.3 (*)    Monocytes Absolute 1.0 (*)    All other components within normal limits  URINE CULTURE  COMPREHENSIVE METABOLIC PANEL    Imaging Review No results found.  Dispensed/ Date Prescribed Drug Name/ NDC Qty. Dispensed/ Days Supply Refill #/ Authorized Refills Programmer, multimedia Recipient *Pmt. Method MED Daily 12/21/2014 12/21/2014 HYDROCODON- ACETAMINOPHN 10- 325 [id number] 10 10 0 0 [id number] WARE TRACY A MD [place], [place] [id number] [facility] [place], [place], [place] [birth year] [address] [place], [place] [address] 04 10 11/24/2014 11/24/2014 [facility] [id number] 371 12 0 0 [id number] [place], [place] [id number] [facility] [place], [place] Bowsher, Nataley H 05-29-1961 [address] [place], [place] [address] 04 0 11/22/2014 08/15/2014 CLONAZEPAM 1 MG TABLET [id number] 60 30 3 5  [id number] QPYP PJKDT A MD [place], [place] [id number] Luretha Murphy, [place], Barbi 10-02-1960 [place], [place] [address] 04 0 11/22/2014 09/14/2014 TRAMADOL HCL 50 MG TABLET [id number] 419 30 2 5  [id number] XBDZ HGDJM A MD [place], [place] [id number] Luretha Murphy,  [place], [place] 23-Sep-1960 [place], [place] [address] 04 20 11/19/2014 11/19/2014 ALPRAZOLAM 0.5 MG TABLET [id number] 60 30 0 2 [id number] EHUD JSHFW A MD [place], [place] [id number] Luretha Murphy, [place], Jarah 14-Oct-1960 [place], [place] [address] 04 0 10/30/2014 10/30/2014 CARISOPRODOL 250 MG TABLET [id number] 60 20 0 2 [id number] HEARD Fontaine No MD MPH [place], [place] [id number] [place] [facility] [place], [place] Shvartsman, Imogen H 12/15/1960 [address] [place], [place] [address] 04 0 10/25/2014 08/15/2014 CLONAZEPAM 1 MG TABLET [id number] 60 30 2 5  [id number] Kipp Brood MD [place], [place] [id number] Luretha Murphy, Laurie Tech, Leonetta 30-Dec-1960 [place], [place] [address] 04 0 10/25/2014 09/14/2014 TRAMADOL HCL 50 MG TABLET [id number] 120 30 1 5  [id number] [place], [place] [id number] [employer]  Phillip Heal, [place], [place] 11-May-1961 [place], [place] [address] 04 20 10/18/2014 07/29/2014 TEMAZEPAM 15 MG CAPSULE [id number] 60 30 1 2  [id number] ZJQB HALPF A MD [place], [place] [id number] Luretha Murphy, [place], Lyan [birth year] [place], [place] [address] 04 0 10/11/2014 09/20/2014 NUVIGIL 250 MG TABLET [id number] 22 97 1 1 [id number] MD [place], [place] [id number] [address] [place], [place] Oct 08, 1960 [place], [place] [address] 01 0 10/01/2014 09/20/2014 NUVIGIL 250 MG TABLET [id number] 70 26 0 1 [id number] OYDX AJO MD [place], [place] [id number] [address] [place], [place] Nov 30, 1960 [place], [place] [address] 01 0 09/24/2014 08/15/2014 CLONAZEPAM 1 MG TABLET [id number] 60 30 1 5  [id number] Kipp Brood MD [place], [place] [id number] Luretha Murphy, [place], [place] 1960-07-29 [place], [place] [address] 04 0 09/14/2014 09/14/2014 TRAMADOL HCL 50 MG TABLET [id number] 638 30 0 5 [id number] WARE  TRACY A MD [place], [place] [id number] Luretha Murphy, [place], Yaneli 09/08/1960 [place], [place] [address] 04 20 08/28/2014 03/19/2014 TRAMADOL HCL 50 MG TABLET [id number] 33 30 5 5  [id number] Kipp Brood MD [place], [place] [id number] [address], [place], [place] 06/17/1961 [place], [place] [address] 04 10 08/22/2014 08/15/2014 CLONAZEPAM 1 MG TABLET [id number] 60 30 0 5 [id number] XMIW OEHOZ A MD [place], [place] [id number] [address], [place], [place] 11-28-1960 [place], [place] [address] 04 0 07/29/2014 07/29/2014 TEMAZEPAM 15 MG CAPSULE [id number] 60 30 0 2 [id number] JZPH XTAVW A MD [place], [place] [id number] [address], [place], [place] 1961/05/08 [place], [place] [address] 04 0 07/29/2014 03/19/2014 TRAMADOL HCL 50 MG TABLET [id number] 20 30 4 5  [id number] Kipp Brood MD [place], [place] [id number] [address] [place], [place] 1961/04/01 [place], [place] [address] 04 10 07/25/2014 05/18/2014 CLONAZEPAM 1 MG TABLET [id number] 45 30 1 5  [id number] Kipp Brood MD [place], [place] [id number] [address] [place], Dereona 1960-09-21 [place], [place] [address] 04 0 07/10/2014 04/20/2014 NUVIGIL 250 MG TABLET [id number] 57 90 1 1 [id number] ANVB TYOMA Y MD [place], [place] [id number] [place], [place], [place] December 07, 1960 Suzi Roots [place],  [address]  Patient reported she is feeling better pain eased some after toradol injection.  Feels ready to go home, push fluids, strain urine.  Go to ER if worsening symptoms over the weekend for re-evaluation/possible CT scan if worsening pain/unable to urinate/fever.  Patient able to sit or lie on exam table now in comfortable position.  On initial evaluation unable to find comfortable position/pacing room.  MDM   1. Acute  left flank pain    [facility] closed today.  Patient to contact them on Monday to schedule appt.  If not accepting new patients to contact clinic for assistance with new urology referral from provider of day as I will be on vacation.  Patient given copy of labs, urine strainer.  Urine culture pending x 48 hours staff will contact patient if positive.  Patient may also contact clinic for results.  Start flomax 0.4mg  po daily has taken previously without side effects.  Patient requested pyridium po TID x 2 days.  Patient currently taking norco for restless leg syndrome.  Refilled norco 5/325mg  po QID prn flank pain.  Discussed lab results with patient  and given copies of reports.  Urology apt to be scheduled if no stone passed in next 3 days.  Strain all urine.  Hydrate, hydrate.   Flomax 0.4mg  daily as directed.  Patient is also to push fluids and strain urine. Call or return to clinic as needed if these symptoms worsen or fail to improve as anticipated e.g. gross hematuria, fever, worsening pain, unable to void--to ER of choice this weekend as CT not available at [facility] at this time.  Exitcare handout on nephrolithiasis/flank pain given to patient.  Patient verbalized agreement and understanding of treatment plan and had no further questions at this time. P2:  Hydrate and cranberry juice    Olen Cordial, NP 12/28/14 [place], NP 12/28/14 1442  Telephone message left for patient to contact clinic for urine culture results. 13 Jan 2015 at [place], NP 01/13/15 1630

## 2014-12-28 NOTE — ED Notes (Signed)
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 Has a hx of kidney stones

## 2014-12-28 NOTE — Discharge Instructions (Signed)
Kidney Stones °Kidney stones (urolithiasis) are deposits that form inside your kidneys. The intense pain is caused by the stone moving through the urinary tract. When the stone moves, the ureter goes into spasm around the stone. The stone is usually passed in the urine.  °CAUSES  °· A disorder that makes certain neck glands produce too much parathyroid hormone (primary hyperparathyroidism). °· A buildup of uric acid crystals, similar to gout in your joints. °· Narrowing (stricture) of the ureter. °· A kidney obstruction present at birth (congenital obstruction). °· Previous surgery on the kidney or ureters. °· Numerous kidney infections. °SYMPTOMS  °· Feeling sick to your stomach (nauseous). °· Throwing up (vomiting). °· Blood in the urine (hematuria). °· Pain that usually spreads (radiates) to the groin. °· Frequency or urgency of urination. °DIAGNOSIS  °· Taking a history and physical exam. °· Blood or urine tests. °· CT scan. °· Occasionally, an examination of the inside of the urinary bladder (cystoscopy) is performed. °TREATMENT  °· Observation. °· Increasing your fluid intake. °· Extracorporeal shock wave lithotripsy--This is a noninvasive procedure that uses shock waves to break up kidney stones. °· Surgery may be needed if you have severe pain or persistent obstruction. There are various surgical procedures. Most of the procedures are performed with the use of small instruments. Only small incisions are needed to accommodate these instruments, so recovery time is minimized. °The size, location, and chemical composition are all important variables that will determine the proper choice of action for you. Talk to your health care provider to better understand your situation so that you will minimize the risk of injury to yourself and your kidney.  °HOME CARE INSTRUCTIONS  °· Drink enough water and fluids to keep your urine clear or pale yellow. This will help you to pass the stone or stone fragments. °· Strain  all urine through the provided strainer. Keep all particulate matter and stones for your health care provider to see. The stone causing the pain may be as small as a grain of salt. It is very important to use the strainer each and every time you pass your urine. The collection of your stone will allow your health care provider to analyze it and verify that a stone has actually passed. The stone analysis will often identify what you can do to reduce the incidence of recurrences. °· Only take over-the-counter or prescription medicines for pain, discomfort, or fever as directed by your health care provider. °· Make a follow-up appointment with your health care provider as directed. °· Get follow-up X-rays if required. The absence of pain does not always mean that the stone has passed. It may have only stopped moving. If the urine remains completely obstructed, it can cause loss of kidney function or even complete destruction of the kidney. It is your responsibility to make sure X-rays and follow-ups are completed. Ultrasounds of the kidney can show blockages and the status of the kidney. Ultrasounds are not associated with any radiation and can be performed easily in a matter of minutes. °SEEK MEDICAL CARE IF: °· You experience pain that is progressive and unresponsive to any pain medicine you have been prescribed. °SEEK IMMEDIATE MEDICAL CARE IF:  °· Pain cannot be controlled with the prescribed medicine. °· You have a fever or shaking chills. °· The severity or intensity of pain increases over 18 hours and is not relieved by pain medicine. °· You develop a new onset of abdominal pain. °· You feel faint or pass out. °·   You are unable to urinate. MAKE SURE YOU:   Understand these instructions.  Will watch your condition.  Will get help right away if you are not doing well or get worse. Document Released: 06/22/2005 Document Revised: 02/22/2013 Document Reviewed: 11/23/2012 [facility] Patient Information 2015  [place], [place]. This information is not intended to replace advice given to you by your health care provider. Make sure you discuss any questions you have with your health care provider. Flank Pain Flank pain refers to pain that is located on the side of the body between the upper abdomen and the back. The pain may occur over a short period of time (acute) or may be long-term or reoccurring (chronic). It may be mild or severe. Flank pain can be caused by many things. CAUSES  Some of the more common causes of flank pain include:  Muscle strains.   Muscle spasms.   A disease of your spine (vertebral disk disease).   A lung infection (pneumonia).   Fluid around your lungs (pulmonary edema).   A kidney infection.   Kidney stones.   A very painful skin rash caused by the chickenpox virus (shingles).   Gallbladder disease.  [place] care will depend on the cause of your pain. In general,  Rest as directed by your caregiver.  Drink enough fluids to keep your urine clear or pale yellow.  Only take over-the-counter or prescription medicines as directed by your caregiver. Some medicines may help relieve the pain.  Tell your caregiver about any changes in your pain.  Follow up with your caregiver as directed. SEEK IMMEDIATE MEDICAL CARE IF:   Your pain is not controlled with medicine.   You have new or worsening symptoms.  Your pain increases.   You have abdominal pain.   You have shortness of breath.   You have persistent nausea or vomiting.   You have swelling in your abdomen.   You feel faint or pass out.   You have blood in your urine.  You have a fever or persistent symptoms for more than 2-3 days.  You have a fever and your symptoms suddenly get worse. MAKE SURE YOU:   Understand these instructions.  Will watch your condition.  Will get help right away if you are not doing well or get worse. Document Released: 08/13/2005  Document Revised: 03/16/2012 Document Reviewed: 02/04/2012 [facility] Patient Information 2015 [place], [place]. This information is not intended to replace advice given to you by your health care provider. Make sure you discuss any questions you have with your health care provider.

## 2014-12-30 LAB — URINE CULTURE: Special Requests: NORMAL

## 2015-01-02 ENCOUNTER — Encounter: Payer: Medicare Other | Admitting: Physical Therapy

## 2015-01-03 NOTE — Discharge Instructions (Signed)

## 2015-01-04 ENCOUNTER — Encounter
Admission: RE | Disposition: A | Discharge status: Home / Self Care | Payer: Medicare Other | Source: Ambulatory Visit | Attending: Gastroenterology

## 2015-01-04 ENCOUNTER — Other Ambulatory Visit: Payer: Self-pay | Admitting: Gastroenterology

## 2015-01-04 ENCOUNTER — Ambulatory Visit: Payer: Medicare Other | Admitting: Student in an Organized Health Care Education/Training Program

## 2015-01-04 ENCOUNTER — Ambulatory Visit
Admission: RE | Admit: 2015-01-04 | Discharge: 2015-01-04 | Disposition: A | Discharge status: Home / Self Care | Payer: Medicare Other | Source: Ambulatory Visit | Attending: Gastroenterology | Admitting: Gastroenterology

## 2015-01-04 DIAGNOSIS — Z809 Family history of malignant neoplasm, unspecified: Secondary | ICD-10-CM | POA: Diagnosis not present

## 2015-01-04 DIAGNOSIS — M4802 Spinal stenosis, cervical region: Secondary | ICD-10-CM | POA: Insufficient documentation

## 2015-01-04 DIAGNOSIS — F419 Anxiety disorder, unspecified: Secondary | ICD-10-CM | POA: Diagnosis not present

## 2015-01-04 DIAGNOSIS — J310 Chronic rhinitis: Secondary | ICD-10-CM | POA: Insufficient documentation

## 2015-01-04 DIAGNOSIS — Z8249 Family history of ischemic heart disease and other diseases of the circulatory system: Secondary | ICD-10-CM | POA: Diagnosis not present

## 2015-01-04 DIAGNOSIS — Z833 Family history of diabetes mellitus: Secondary | ICD-10-CM | POA: Insufficient documentation

## 2015-01-04 DIAGNOSIS — Z7952 Long term (current) use of systemic steroids: Secondary | ICD-10-CM | POA: Insufficient documentation

## 2015-01-04 DIAGNOSIS — Z79899 Other long term (current) drug therapy: Secondary | ICD-10-CM | POA: Diagnosis not present

## 2015-01-04 DIAGNOSIS — J45909 Unspecified asthma, uncomplicated: Secondary | ICD-10-CM | POA: Insufficient documentation

## 2015-01-04 DIAGNOSIS — Z86010 Personal history of colonic polyps: Secondary | ICD-10-CM | POA: Diagnosis not present

## 2015-01-04 DIAGNOSIS — I10 Essential (primary) hypertension: Secondary | ICD-10-CM | POA: Diagnosis not present

## 2015-01-04 DIAGNOSIS — F4310 Post-traumatic stress disorder, unspecified: Secondary | ICD-10-CM | POA: Insufficient documentation

## 2015-01-04 DIAGNOSIS — R7989 Other specified abnormal findings of blood chemistry: Secondary | ICD-10-CM | POA: Insufficient documentation

## 2015-01-04 DIAGNOSIS — F319 Bipolar disorder, unspecified: Secondary | ICD-10-CM | POA: Insufficient documentation

## 2015-01-04 DIAGNOSIS — Z9049 Acquired absence of other specified parts of digestive tract: Secondary | ICD-10-CM | POA: Diagnosis not present

## 2015-01-04 DIAGNOSIS — K219 Gastro-esophageal reflux disease without esophagitis: Secondary | ICD-10-CM | POA: Insufficient documentation

## 2015-01-04 DIAGNOSIS — Z882 Allergy status to sulfonamides status: Secondary | ICD-10-CM | POA: Insufficient documentation

## 2015-01-04 DIAGNOSIS — R194 Change in bowel habit: Secondary | ICD-10-CM | POA: Diagnosis present

## 2015-01-04 DIAGNOSIS — R197 Diarrhea, unspecified: Secondary | ICD-10-CM | POA: Diagnosis not present

## 2015-01-04 DIAGNOSIS — R0602 Shortness of breath: Secondary | ICD-10-CM | POA: Diagnosis not present

## 2015-01-04 DIAGNOSIS — Z90710 Acquired absence of both cervix and uterus: Secondary | ICD-10-CM | POA: Insufficient documentation

## 2015-01-04 DIAGNOSIS — Z881 Allergy status to other antibiotic agents status: Secondary | ICD-10-CM | POA: Diagnosis not present

## 2015-01-04 DIAGNOSIS — Z885 Allergy status to narcotic agent status: Secondary | ICD-10-CM | POA: Insufficient documentation

## 2015-01-04 DIAGNOSIS — G2581 Restless legs syndrome: Secondary | ICD-10-CM | POA: Insufficient documentation

## 2015-01-04 DIAGNOSIS — Z825 Family history of asthma and other chronic lower respiratory diseases: Secondary | ICD-10-CM | POA: Diagnosis not present

## 2015-01-04 HISTORY — DX: Reserved for inherently not codable concepts without codable children: IMO0001

## 2015-01-04 HISTORY — DX: Restless legs syndrome: G25.81

## 2015-01-04 HISTORY — DX: Essential (primary) hypertension: I10

## 2015-01-04 HISTORY — DX: Other specified abnormal findings of blood chemistry: R79.89

## 2015-01-04 HISTORY — DX: Other specified postprocedural states: Z98.890

## 2015-01-04 HISTORY — DX: Spinal stenosis, cervical region: M48.02

## 2015-01-04 HISTORY — DX: Other specified postprocedural states: R11.2

## 2015-01-04 HISTORY — DX: Abnormal results of liver function studies: R94.5

## 2015-01-04 HISTORY — DX: Other intervertebral disc degeneration, lumbar region without mention of lumbar back pain or lower extremity pain: M51.369

## 2015-01-04 HISTORY — DX: Other intervertebral disc degeneration, lumbar region: M51.36

## 2015-01-04 HISTORY — PX: COLONOSCOPY: SHX5424

## 2015-01-04 HISTORY — DX: Motion sickness, initial encounter: T75.3XXA

## 2015-01-04 HISTORY — DX: Other intervertebral disc displacement, lumbar region: M51.26

## 2015-01-04 HISTORY — DX: Unspecified osteoarthritis, unspecified site: M19.90

## 2015-01-04 SURGERY — COLONOSCOPY
Anesthesia: Monitor Anesthesia Care | Wound class: Contaminated

## 2015-01-04 MED ORDER — ACETAMINOPHEN 325 MG PO TABS
325.0000 mg | ORAL_TABLET | ORAL | Status: DC | PRN
  Administered 2015-01-04: 325 mg via ORAL

## 2015-01-04 MED ORDER — STERILE WATER FOR IRRIGATION IR SOLN
Status: DC | PRN
  Administered 2015-01-04: 08:00:00

## 2015-01-04 MED ORDER — ACETAMINOPHEN 160 MG/5ML PO SOLN: 325.0000 mg | ORAL | Status: DC | PRN

## 2015-01-04 MED ORDER — PROPOFOL 10 MG/ML IV BOLUS
INTRAVENOUS | Status: DC | PRN
  Administered 2015-01-04 (×2): 50 mg via INTRAVENOUS
  Administered 2015-01-04: 100 mg via INTRAVENOUS
  Administered 2015-01-04 (×4): 50 mg via INTRAVENOUS
  Administered 2015-01-04: 20 mg via INTRAVENOUS

## 2015-01-04 MED ORDER — LIDOCAINE HCL (CARDIAC) 20 MG/ML IV SOLN
INTRAVENOUS | Status: DC | PRN
  Administered 2015-01-04: 40 mg via INTRAVENOUS

## 2015-01-04 MED ORDER — OXYCODONE-ACETAMINOPHEN 5-325 MG PO TABS: 1.0000 | ORAL_TABLET | Freq: Once | ORAL | Status: DC

## 2015-01-04 MED ORDER — OXYCODONE HCL 5 MG PO TABS
5.0000 mg | ORAL_TABLET | Freq: Four times a day (QID) | ORAL | Status: DC | PRN
  Administered 2015-01-04: 5 mg via ORAL

## 2015-01-04 MED ORDER — LACTATED RINGERS IV SOLN
INTRAVENOUS | Status: DC
  Administered 2015-01-04: 07:00:00 via INTRAVENOUS

## 2015-01-04 SURGICAL SUPPLY — 28 items
CANISTER SUCT 1200ML W/VALVE (MISCELLANEOUS) ×2 IMPLANT
FCP ESCP3.2XJMB 240X2.8X (MISCELLANEOUS)
FORCEPS BIOP RAD 4 LRG CAP 4 (CUTTING FORCEPS) ×1 IMPLANT
FORCEPS BIOP RJ4 240 W/NDL (MISCELLANEOUS)
FORCEPS ESCP3.2XJMB 240X2.8X (MISCELLANEOUS) IMPLANT
GOWN CVR UNV OPN BCK APRN NK (MISCELLANEOUS) ×2 IMPLANT
GOWN ISOL THUMB LOOP REG UNIV (MISCELLANEOUS) ×4
HEMOCLIP INSTINCT (CLIP) IMPLANT
INJECTOR VARIJECT VIN23 (MISCELLANEOUS) IMPLANT
KIT CO2 TUBING (TUBING) IMPLANT
KIT DEFENDO VALVE AND CONN (KITS) IMPLANT
KIT ENDO PROCEDURE OLY (KITS) ×2 IMPLANT
LIGATOR MULTIBAND 6SHOOTER MBL (MISCELLANEOUS) IMPLANT
MARKER SPOT ENDO TATTOO 5ML (MISCELLANEOUS) IMPLANT
PAD GROUND ADULT SPLIT (MISCELLANEOUS) IMPLANT
SNARE SHORT THROW 13M SML OVAL (MISCELLANEOUS) IMPLANT
SNARE SHORT THROW 30M LRG OVAL (MISCELLANEOUS) IMPLANT
SPOT EX ENDOSCOPIC TATTOO (MISCELLANEOUS)
SUCTION POLY TRAP 4CHAMBER (MISCELLANEOUS) IMPLANT
TRAP SUCTION POLY (MISCELLANEOUS) IMPLANT
TUBING CONN 6MMX3.1M (TUBING)
TUBING SUCTION CONN 0.25 STRL (TUBING) IMPLANT
UNDERPAD 30X60 958B10 (PK) (MISCELLANEOUS) IMPLANT
VALVE BIOPSY ENDO (VALVE) IMPLANT
VARIJECT INJECTOR VIN23 (MISCELLANEOUS)
WATER AUXILLARY (MISCELLANEOUS) IMPLANT
WATER STERILE IRR 250ML POUR (IV SOLUTION) ×2 IMPLANT
WATER STERILE IRR 500ML POUR (IV SOLUTION) IMPLANT

## 2015-01-04 NOTE — Anesthesia Preprocedure Evaluation (Signed)
Anesthesia Evaluation  Patient identified by MRN, date of birth, ID band  Reviewed: Allergy & Precautions, H&P , NPO status , Patient's Chart, lab work & pertinent test results  History of Anesthesia Complications (+) PONV and history of anesthetic complications  Airway Mallampati: I  TM Distance: >3 FB Neck ROM: full    Dental no notable dental hx.    Pulmonary asthma ,    Pulmonary exam normal       Cardiovascular hypertension, Rhythm:regular Rate:Normal     Neuro/Psych PSYCHIATRIC DISORDERS    GI/Hepatic GERD-  ,  Endo/Other    Renal/GU      Musculoskeletal   Abdominal   Peds  Hematology   Anesthesia Other Findings   Reproductive/Obstetrics                             Anesthesia Physical Anesthesia Plan  ASA: II  Anesthesia Plan: MAC   Post-op Pain Management:    Induction:   Airway Management Planned:   Additional Equipment:   Intra-op Plan:   Post-operative Plan:   Informed Consent: I have reviewed the patients History and Physical, chart, labs and discussed the procedure including the risks, benefits and alternatives for the proposed anesthesia with the patient or authorized representative who has indicated his/her understanding and acceptance.     Plan Discussed with: CRNA  Anesthesia Plan Comments:         Anesthesia Quick Evaluation

## 2015-01-04 NOTE — Anesthesia Postprocedure Evaluation (Signed)
  Anesthesia Post-op Note  Patient: Brandi Bell  Procedure(s) Performed: Procedure(s) with comments: COLONOSCOPY (N/A) - with biopsies  Anesthesia type:MAC  Patient location: PACU  Post pain: Pain level controlled  Post assessment: Post-op Vital signs reviewed, Patient's Cardiovascular Status Stable, Respiratory Function Stable, Patent Airway and No signs of Nausea or vomiting  Post vital signs: Reviewed and stable  Last Vitals:  Filed Vitals:   01/04/15 0915  BP: 103/70  Pulse: 72  Temp:   Resp: 18    Level of consciousness: awake, alert  and patient cooperative  Complications: No apparent anesthesia complications

## 2015-01-04 NOTE — Anesthesia Procedure Notes (Signed)
Procedure Name: MAC Performed by: Betzabeth Derringer Pre-anesthesia Checklist: Patient identified, Emergency Drugs available, Suction available, Timeout performed and Patient being monitored Patient Re-evaluated:Patient Re-evaluated prior to inductionOxygen Delivery Method: Nasal cannula Placement Confirmation: positive ETCO2     

## 2015-01-04 NOTE — H&P (Signed)
[facility]  [address] [place], [place] [address] Phone: [phone] Fax : [phone]  Primary Care Physician:  Richrd Humbles, MD Primary Gastroenterologist:  Dr. Allen Norris  Pre-Procedure History & Physical: HPI:  Brandi Bell is a 54 y.o. female is here for an colonoscopy.   Past Medical History  Diagnosis Date  . Depression   . Suicide attempt   . Microhematuria   . History of migraine headaches   . GERD (gastroesophageal reflux disease)   . Rhinitis   . Bipolar disorder   . PTSD (post-traumatic stress disorder)   . Anxiety   . Arthritis     Feet, Hands  . Spinal stenosis of cervical region   . Bulging lumbar disc   . Shortness of breath dyspnea   . Elevated LFTs     having ultrasound [facility] - 12/14/14  . Restless leg syndrome   . Bipolar disorder   . Asthma     [facility] admission - on Bipap - 7/13 - report on paper chart  . PONV (postoperative nausea and vomiting)   . Motion sickness   . Hypertension     Echo -1/16 - report on paper chart    Past Surgical History  Procedure Laterality Date  . Vaginal hysterectomy    . Tubaligation    . Cholecystectomy    . Foot surgery      multiple  . Septoplasty    . Anterior and posterior repair    . Bladder tact    . Shoulder arthroscopy    . Tonsillectomy    . Cardiac catheterization  2008    [facility] - neg - report in paper chart    Prior to Admission medications   Medication Sig Start Date End Date Taking? Authorizing Provider  albuterol (PROVENTIL HFA;VENTOLIN HFA) 108 (90 BASE) MCG/ACT inhaler Inhale 2 puffs into the lungs every 6 (six) hours as needed.   Yes Historical Provider, MD  ARIPiprazole (ABILIFY) 15 MG tablet Take 20 mg by mouth daily. AM   Yes Historical Provider, MD  clonazePAM (KLONOPIN) 1 MG tablet 0.5 mg. Takes 1 tablet twice daily.   Yes Historical Provider, MD  fluticasone (FLONASE) 50 MCG/ACT nasal spray Place into both nostrils daily.   Yes Historical Provider, MD    lamoTRIgine (LAMICTAL) 25 MG tablet Take 50 mg by mouth daily. AM   Yes Historical Provider, MD  lisinopril (PRINIVIL,ZESTRIL) 10 MG tablet Take 10 mg by mouth daily. AM   Yes Historical Provider, MD  oxyCODONE-acetaminophen (PERCOCET) 10-325 MG per tablet Take 1 tablet by mouth every 4 (four) hours as needed for pain.   Yes Historical Provider, MD  pantoprazole (PROTONIX) 40 MG tablet Take 40 mg by mouth daily. AM   Yes Historical Provider, MD  rizatriptan (MAXALT) 10 MG tablet Take 10 mg by mouth as needed for migraine. May repeat in 2 hours if needed   Yes Historical Provider, MD  tamsulosin (FLOMAX) 0.4 MG CAPS capsule Take 1 capsule (0.4 mg total) by mouth daily. 12/28/14  Yes Olen Cordial, NP  valACYclovir (VALTREX) 500 MG tablet Take 500 mg by mouth at bedtime as needed.    Yes Historical Provider, MD  Fluticasone-Salmeterol (ADVAIR) 250-50 MCG/DOSE AEPB Inhale 1 puff into the lungs as directed.    Historical Provider, MD  HYDROcodone-acetaminophen (NORCO/VICODIN) 5-325 MG per tablet Take 1 tablet by mouth every 6 (six) hours as needed for moderate pain or severe pain. 12/28/14   Olen Cordial, NP  phenazopyridine (PYRIDIUM) 200 MG tablet Take 1 tablet (200 mg total) by mouth 3 (three) times daily. 12/28/14   Olen Cordial, NP    Allergies as of 12/12/2014 - Review Complete 12/12/2014  Allergen Reaction Noted  . Amoxicillin  05/23/2012  . Ciprofloxacin  05/23/2012  . Codeine  05/23/2012  . Morphine and related  05/23/2012  . Sulfa antibiotics  12/07/2014    Family History  Problem Relation Age of Onset  . Asthma Mother   . Cancer Mother   . Diabetes Mother   . Heart attack Father     History   Social History  . Marital Status: Divorced    Spouse Name: N/A  . Number of Children: N/A  . Years of Education: N/A   Occupational History  . Not on file.   Social History Main Topics  . Smoking status: Never Smoker   . Smokeless tobacco: Not on file  . Alcohol  Use: No  . Drug Use: Not on file  . Sexual Activity: No   Other Topics Concern  . Not on file   Social History Narrative    Review of Systems: See HPI, otherwise negative ROS  Physical Exam: BP 115/80 mmHg  Pulse 82  Temp(Src) 98.8 F (37.1 C) (Temporal)  Resp 18  Ht 5\' 1"  (1.549 m)  Wt 209 lb (94.802 kg)  BMI 39.51 kg/m2  SpO2 98% General:   Alert,  pleasant and cooperative in NAD Head:  Normocephalic and atraumatic. Neck:  Supple; no masses or thyromegaly. Lungs:  Clear throughout to auscultation.    Heart:  Regular rate and rhythm. Abdomen:  Soft, nontender and nondistended. Normal bowel sounds, without guarding, and without rebound.   Neurologic:  Alert and  oriented x4;  grossly normal neurologically.  Impression/Plan: Brandi Bell is here for an colonoscopy to be performed for history of polyps with change in bowel habits.  Risks, benefits, limitations, and alternatives regarding  colonoscopy have been reviewed with the patient.  Questions have been answered.  All parties agreeable.   Ollen Bowl, MD  01/04/2015, 7:55 AM

## 2015-01-04 NOTE — Op Note (Signed)
[facility] Gastroenterology Patient Name: Brandi Bell Procedure Date: 01/04/2015 8:25 AM MRN: [id number] Account #: [id number] Date of Birth: [birth year] Admit Type: Outpatient Age: 54 Room: [facility] OR ROOM 01 Gender: Female Note Status: Finalized Procedure:         Colonoscopy Indications:       High risk colon cancer surveillance: Personal history of                     colonic polyps, Incidental - Chronic diarrhea, Incidental                     - Change in bowel habits Providers:         Lucilla Lame, MD Referring MD:      Jerrell Belfast, MD (Referring MD) Medicines:         Propofol per Anesthesia Complications:     No immediate complications. Procedure:         Pre-Anesthesia Assessment:                    - Prior to the procedure, a History and Physical was                     performed, and patient medications and allergies were                     reviewed. The patient's tolerance of previous anesthesia                     was also reviewed. The risks and benefits of the procedure                     and the sedation options and risks were discussed with the                     patient. All questions were answered, and informed consent                     was obtained. Prior Anticoagulants: The patient has taken                     no previous anticoagulant or antiplatelet agents. ASA                     Grade Assessment: II - A patient with mild systemic                     disease. After reviewing the risks and benefits, the                     patient was deemed in satisfactory condition to undergo                     the procedure.                    After obtaining informed consent, the colonoscope was                     passed under direct vision. Throughout the procedure, the                     patient's blood pressure, pulse, and oxygen saturations  were monitored continuously. The Olympus CF H180AL   colonoscope (S#: [id number]) was introduced through the anus                     and advanced to the the terminal ileum. The colonoscopy                     was performed without difficulty. The patient tolerated                     the procedure well. The quality of the bowel preparation                     was excellent. Findings:      The perianal and digital rectal examinations were normal.      The colon (entire examined portion) appeared normal. Random biopsies       were obtained with cold forceps for histology randomly in the entire       colon.      The terminal ileum appeared normal. Biopsies were taken with a cold       forceps for histology. Impression:        - The entire examined colon is normal.                    - The examined portion of the ileum was normal. Biopsied.                    - Random biopsies were obtained in the entire colon. Recommendation:    - Await pathology results.                    - Repeat colonoscopy in 5 years for surveillance. Procedure Code(s): --- Professional ---                    [phone], Colonoscopy, flexible; with biopsy, single or                     multiple Diagnosis Code(s): --- Professional ---                    Z86.010, Personal history of colonic polyps CPT copyright 2014 American [employer]. All rights reserved. The codes documented in this report are preliminary and upon coder review may  be revised to meet current compliance requirements. Lucilla Lame, MD 01/04/2015 8:42:51 AM This report has been signed electronically. Number of Addenda: 0 Note Initiated On: 01/04/2015 8:25 AM Total Procedure Duration: 0 hours 10 minutes 44 seconds       [facility]

## 2015-01-04 NOTE — Transfer of Care (Signed)
Immediate Anesthesia Transfer of Care Note  Patient: Brandi Bell  Procedure(s) Performed: Procedure(s) with comments: COLONOSCOPY (N/A) - with biopsies  Patient Location: PACU  Anesthesia Type: MAC  Level of Consciousness: awake, alert  and patient cooperative  Airway and Oxygen Therapy: Patient Spontanous Breathing and Patient connected to supplemental oxygen  Post-op Assessment: Post-op Vital signs reviewed, Patient's Cardiovascular Status Stable, Respiratory Function Stable, Patent Airway and No signs of Nausea or vomiting  Post-op Vital Signs: Reviewed and stable  Complications: No apparent anesthesia complications

## 2015-01-08 ENCOUNTER — Encounter: Payer: Self-pay | Admitting: Gastroenterology

## 2015-01-09 ENCOUNTER — Telehealth: Payer: Self-pay | Admitting: Gastroenterology

## 2015-01-09 ENCOUNTER — Encounter: Payer: Medicare Other | Admitting: Physical Therapy

## 2015-01-09 DIAGNOSIS — Z86010 Personal history of colonic polyps: Secondary | ICD-10-CM | POA: Insufficient documentation

## 2015-01-09 NOTE — Telephone Encounter (Signed)
results

## 2015-01-09 NOTE — Telephone Encounter (Signed)
LVM for pt to return my call.

## 2015-01-10 ENCOUNTER — Encounter: Payer: Self-pay | Admitting: Gastroenterology

## 2015-01-11 NOTE — Telephone Encounter (Signed)
Pt notified of results

## 2015-01-14 ENCOUNTER — Ambulatory Visit: Payer: Medicare Other | Admitting: Gastroenterology

## 2015-01-15 ENCOUNTER — Telehealth: Payer: Self-pay

## 2015-01-15 DIAGNOSIS — R197 Diarrhea, unspecified: Secondary | ICD-10-CM

## 2015-01-15 NOTE — Telephone Encounter (Signed)
Pt came to the office today complaining of severe diarrhea. She is requesting something to help with this. She said it is "pouring out of her" every time she goes to the bathroom. Pt cannot afford to start Viberzi. Is there something cheaper we can prescribe? Please let me know.

## 2015-01-16 MED ORDER — DIPHENOXYLATE-ATROPINE 2.5-0.025 MG PO TABS: 1.0000 | ORAL_TABLET | Freq: Four times a day (QID) | ORAL | Status: DC | PRN

## 2015-01-16 NOTE — Telephone Encounter (Signed)
Rx for Lomotil sent to pt's pharmacy. Pt has been notified this has been sent.

## 2015-01-16 NOTE — Telephone Encounter (Signed)
The biopsies showed no inflammation or cause for her diarrhea. Tell her to avoid mild products as they are hard to digest and start her on Lomotil 1-2 tabs every 8 hours.

## 2015-01-24 ENCOUNTER — Ambulatory Visit: Payer: Medicare Other | Admitting: Urology

## 2015-02-01 ENCOUNTER — Ambulatory Visit
Admission: EM | Admit: 2015-02-01 | Discharge: 2015-02-01 | Disposition: A | Discharge status: Home / Self Care | Payer: Medicare Other | Attending: Emergency Medicine | Admitting: Emergency Medicine

## 2015-02-01 DIAGNOSIS — K1379 Other lesions of oral mucosa: Secondary | ICD-10-CM

## 2015-02-01 DIAGNOSIS — B370 Candidal stomatitis: Secondary | ICD-10-CM | POA: Diagnosis not present

## 2015-02-01 MED ORDER — NYSTATIN 100000 UNIT/ML MT SUSP: 500000.0000 [IU] | Freq: Four times a day (QID) | OROMUCOSAL | Status: DC

## 2015-02-01 NOTE — ED Provider Notes (Signed)
HPI  SUBJECTIVE:  Brandi Bell is a 54 y.o. female who presents with mouth pain/soreness, extending down her throat starting yesterday. States that she has been using her DuoNeb and Ventolin more frequently secondary to an asthma flare. Symptoms are worse when she tries to eat. No alleviating factors. She tried salt water rinses without improvement. No nausea, vomiting, fevers, lip swelling, difficulty breathing, oral or labial ulcers, ear pain. Patient reports a sore spot on her tongue, but thinks that she bit her tongue. She states that this feels like thrush that she has had before. States that nystatin mouthwash worked well for her. Past medical history significant for asthma, hypertension, oral HSV. She takes Valtrex daily. negative for diabetes, HIV, immunocompromise   Past Medical History  Diagnosis Date  . Depression   . Suicide attempt   . Microhematuria   . History of migraine headaches   . GERD (gastroesophageal reflux disease)   . Rhinitis   . Bipolar disorder   . PTSD (post-traumatic stress disorder)   . Anxiety   . Arthritis     Feet, Hands  . Spinal stenosis of cervical region   . Bulging lumbar disc   . Shortness of breath dyspnea   . Elevated LFTs     having ultrasound [facility] - 12/14/14  . Restless leg syndrome   . Bipolar disorder   . Asthma     [facility] admission - on Bipap - 7/13 - report on paper chart  . PONV (postoperative nausea and vomiting)   . Motion sickness   . Hypertension     Echo -1/16 - report on paper chart    Past Surgical History  Procedure Laterality Date  . Vaginal hysterectomy    . Tubaligation    . Cholecystectomy    . Foot surgery      multiple  . Septoplasty    . Anterior and posterior repair    . Bladder tact    . Shoulder arthroscopy    . Tonsillectomy    . Cardiac catheterization  2008    [facility] - neg - report in paper chart  . Colonoscopy N/A 01/04/2015    Procedure: COLONOSCOPY;  Surgeon: Lucilla Lame, MD;  Location: [facility];  Service: Gastroenterology;  Laterality: N/A;  with biopsies    Family History  Problem Relation Age of Onset  . Asthma Mother   . Cancer Mother   . Diabetes Mother   . Heart attack Father     History  Substance Use Topics  . Smoking status: Never Smoker   . Smokeless tobacco: Not on file  . Alcohol Use: No    No current facility-administered medications for this encounter.  Current outpatient prescriptions:  .  albuterol (PROVENTIL HFA;VENTOLIN HFA) 108 (90 BASE) MCG/ACT inhaler, Inhale 2 puffs into the lungs every 6 (six) hours as needed., Disp: , Rfl:  .  ARIPiprazole (ABILIFY) 15 MG tablet, Take 20 mg by mouth daily. AM, Disp: , Rfl:  .  clonazePAM (KLONOPIN) 1 MG tablet, 0.5 mg. Takes 1 tablet twice daily., Disp: , Rfl:  .  diphenoxylate-atropine (LOMOTIL) 2.5-0.025 MG per tablet, Take 1-2 tablets by mouth 4 (four) times daily as needed for diarrhea or loose stools., Disp: 240 tablet, Rfl: 3 .  fluticasone (FLONASE) 50 MCG/ACT nasal spray, Place into both nostrils daily., Disp: , Rfl:  .  Fluticasone-Salmeterol (ADVAIR) 250-50 MCG/DOSE AEPB, Inhale 1 puff into the lungs as directed., Disp: , Rfl:  .  HYDROcodone-acetaminophen (NORCO/VICODIN) 5-325  MG per tablet, Take 1 tablet by mouth every 6 (six) hours as needed for moderate pain or severe pain., Disp: 10 tablet, Rfl: 0 .  lamoTRIgine (LAMICTAL) 25 MG tablet, Take 50 mg by mouth daily. AM, Disp: , Rfl:  .  lisinopril (PRINIVIL,ZESTRIL) 10 MG tablet, Take 10 mg by mouth daily. AM, Disp: , Rfl:  .  oxyCODONE-acetaminophen (PERCOCET) 10-325 MG per tablet, Take 1 tablet by mouth every 4 (four) hours as needed for pain., Disp: , Rfl:  .  pantoprazole (PROTONIX) 40 MG tablet, Take 40 mg by mouth daily. AM, Disp: , Rfl:  .  phenazopyridine (PYRIDIUM) 200 MG tablet, Take 1 tablet (200 mg total) by mouth 3 (three) times daily., Disp: 6 tablet, Rfl: 0 .  rizatriptan (MAXALT) 10 MG tablet, Take 10 mg by mouth as needed for  migraine. May repeat in 2 hours if needed, Disp: , Rfl:  .  tamsulosin (FLOMAX) 0.4 MG CAPS capsule, Take 1 capsule (0.4 mg total) by mouth daily., Disp: 14 capsule, Rfl: 0 .  valACYclovir (VALTREX) 500 MG tablet, Take 500 mg by mouth at bedtime as needed. , Disp: , Rfl:  .  nystatin (MYCOSTATIN) 100000 UNIT/ML suspension, Take 5 mLs (500,000 Units total) by mouth 4 (four) times daily. Swish and swallow x 14 days, Disp: 280 mL, Rfl: 0 .  [DISCONTINUED] atorvastatin (LIPITOR) 10 MG tablet, Takes 1/2 tablet daily at bedtime., Disp: , Rfl:  .  [DISCONTINUED] esomeprazole (NEXIUM) 40 MG capsule, Take 40 mg by mouth daily., Disp: , Rfl:  .  [DISCONTINUED] estradiol (ESTRACE) 0.5 MG tablet, Take 0.5 mg by mouth daily., Disp: , Rfl:  .  [DISCONTINUED] ranitidine (ZANTAC) 150 MG capsule, Take 150 mg by mouth 2 (two) times daily., Disp: , Rfl:  .  [DISCONTINUED] SUMAtriptan (IMITREX) 100 MG tablet, Take 100 mg by mouth as needed., Disp: , Rfl:   Allergies  Allergen Reactions  . Ciprofloxacin Anaphylaxis  . Amoxicillin Hives  . Codeine Nausea And Vomiting  . Morphine And Related Nausea And Vomiting  . Pineapple Other (See Comments)    Mouth blisters  . Sulfa Antibiotics Hives  . Tape Other (See Comments)    Clear tape tears skin.  Tegaderm and paper tape OK.     ROS  As noted in HPI.   Physical Exam  BP 109/76 mmHg  Pulse 74  Temp(Src) 98.6 F (37 C) (Tympanic)  Resp 16  Ht 5\' 1"  (1.549 m)  Wt 207 lb (93.895 kg)  BMI 39.13 kg/m2  SpO2 100%  Constitutional: Well developed, well nourished, no acute distress Eyes:  EOMI, conjunctiva normal bilaterally HENT: Normocephalic, atraumatic,mucus membranes moist. no gingival, intraoral or labial ulcers. Small tender area noted on tongue, appears to be where patient bit her tongue. No intraoral plaques. Oropharynx normal. Respiratory: Normal inspiratory effort Cardiovascular: Normal rate GI: nondistended skin: No rash, skin intact Lymphs: No  lymphadenopathy. Musculoskeletal: no deformities Neurologic: Alert & oriented x 3, no focal neuro deficits Psychiatric: Speech and behavior appropriate   ED Course   Medications - No data to display  No orders of the defined types were placed in this encounter.    No results found for this or any previous visit (from the past 24 hour(s)). No results found.  ED Clinical Impression  Mouth pain  Oral thrush   ED Assessment/Plan   Presentation most consistent with early thrush, or could be irritation from the propellant/inhaled medications. No white patches noted yet, but patient is symptomatic, so  Home with nystatin mouthwash, Benadryl/Maalox mouthwash mix, rinse mouth out after using any inhaler. Follow-up with PMD as needed. Discussed  MDM, plan and followup with patient /Discussed sn/sx that should prompt return to the UC or ED. Patient agrees with plan.  *This clinic note was created using Dragon dictation software. Therefore, there may be occasional mistakes despite careful proofreading.  ?    Melynda Ripple, MD 02/01/15 [phone]

## 2015-02-01 NOTE — Discharge Instructions (Signed)
Take 5 mL of liquid Benadryl and 5 mL of Maalox. Mix it together, and then hold it in your mouth for as long as you can and then swallow. You may do this 4 times a day.    Make sure you rinse out your mouth out thoroughly after using any of your inhalers.  Go to www.goodrx.com to look up your medications. This will give you a list of where you can find your prescriptions at the most affordable prices.

## 2015-02-01 NOTE — ED Notes (Signed)
Pt states "I am an asmatic and have been using my inhalers, I think it has given me thrush, it started yesterday."

## 2015-02-08 ENCOUNTER — Emergency Department
Admission: EM | Admit: 2015-02-08 | Discharge: 2015-02-09 | Disposition: A | Discharge status: Other Acute Inpatient Hospital | Payer: Medicare Other | Attending: Emergency Medicine | Admitting: Emergency Medicine

## 2015-02-08 ENCOUNTER — Encounter: Payer: Self-pay | Admitting: Emergency Medicine

## 2015-02-08 DIAGNOSIS — Z79899 Other long term (current) drug therapy: Secondary | ICD-10-CM | POA: Insufficient documentation

## 2015-02-08 DIAGNOSIS — R45851 Suicidal ideations: Secondary | ICD-10-CM | POA: Diagnosis present

## 2015-02-08 DIAGNOSIS — F329 Major depressive disorder, single episode, unspecified: Secondary | ICD-10-CM | POA: Insufficient documentation

## 2015-02-08 DIAGNOSIS — I10 Essential (primary) hypertension: Secondary | ICD-10-CM | POA: Insufficient documentation

## 2015-02-08 DIAGNOSIS — Z880 Allergy status to penicillin: Secondary | ICD-10-CM | POA: Diagnosis not present

## 2015-02-08 LAB — COMPREHENSIVE METABOLIC PANEL
ALT: 30 U/L (ref 14–54)
AST: 22 U/L (ref 15–41)
Albumin: 4 g/dL (ref 3.5–5.0)
Alkaline Phosphatase: 91 U/L (ref 38–126)
Anion gap: 7 (ref 5–15)
BUN: 13 mg/dL (ref 6–20)
CHLORIDE: 102 mmol/L (ref 101–111)
CO2: 29 mmol/L (ref 22–32)
CREATININE: 0.72 mg/dL (ref 0.44–1.00)
Calcium: 9 mg/dL (ref 8.9–10.3)
GFR calc Af Amer: >60 mL/min (ref >60)
GFR calc non Af Amer: >60 mL/min (ref >60)
Glucose, Bld: 95 mg/dL (ref 65–99)
POTASSIUM: 4.3 mmol/L (ref 3.5–5.1)
Sodium: 138 mmol/L (ref 135–145)
Total Bilirubin: 0.2 mg/dL — ABNORMAL LOW (ref 0.3–1.2)
Total Protein: 7.1 g/dL (ref 6.5–8.1)

## 2015-02-08 LAB — URINALYSIS COMPLETE WITH MICROSCOPIC (ARMC ONLY)
Bacteria, UA: NONE SEEN
Bilirubin Urine: NEGATIVE
Glucose, UA: NEGATIVE mg/dL
Hgb urine dipstick: NEGATIVE
Ketones, ur: NEGATIVE mg/dL
NITRITE: NEGATIVE
Protein, ur: NEGATIVE mg/dL
RBC / HPF: NONE SEEN RBC/hpf (ref 0–5)
Specific Gravity, Urine: 1.024 (ref 1.005–1.030)
pH: 6 (ref 5.0–8.0)

## 2015-02-08 LAB — SALICYLATE LEVEL: Salicylate Lvl: <4 mg/dL (ref 2.8–30.0)

## 2015-02-08 LAB — ACETAMINOPHEN LEVEL

## 2015-02-08 LAB — URINE DRUG SCREEN, QUALITATIVE (ARMC ONLY)
Amphetamines, Ur Screen: NOT DETECTED
BENZODIAZEPINE, UR SCRN: POSITIVE — AB
Barbiturates, Ur Screen: NOT DETECTED
COCAINE METABOLITE, UR ~~LOC~~: NOT DETECTED
Cannabinoid 50 Ng, Ur ~~LOC~~: NOT DETECTED
MDMA (ECSTASY) UR SCREEN: NOT DETECTED
Methadone Scn, Ur: NOT DETECTED
Opiate, Ur Screen: POSITIVE — AB
PHENCYCLIDINE (PCP) UR S: NOT DETECTED
TRICYCLIC, UR SCREEN: NOT DETECTED

## 2015-02-08 LAB — CBC
HCT: 45.2 % (ref 35.0–47.0)
Hemoglobin: 15.6 g/dL (ref 12.0–16.0)
MCH: 31.6 pg (ref 26.0–34.0)
MCHC: 34.6 g/dL (ref 32.0–36.0)
MCV: 91.3 fL (ref 80.0–100.0)
Platelets: 249 10*3/uL (ref 150–440)
RBC: 4.95 MIL/uL (ref 3.80–5.20)
RDW: 12.7 % (ref 11.5–14.5)
WBC: 9.9 10*3/uL (ref 3.6–11.0)

## 2015-02-08 LAB — TROPONIN I: Troponin I: <0.03 ng/mL (ref <0.031)

## 2015-02-08 LAB — PREGNANCY, URINE: PREG TEST UR: NEGATIVE

## 2015-02-08 LAB — LIPASE, BLOOD: Lipase: 24 U/L (ref 22–51)

## 2015-02-08 LAB — ETHANOL: Alcohol, Ethyl (B): <5 mg/dL (ref <5)

## 2015-02-08 MED ORDER — GABAPENTIN 600 MG PO TABS: 600.0000 mg | ORAL_TABLET | Freq: Every day | ORAL | Status: DC

## 2015-02-08 MED ORDER — GABAPENTIN 600 MG PO TABS
ORAL_TABLET | ORAL | Status: AC
  Filled 2015-02-08: qty 1

## 2015-02-08 MED ORDER — BENZTROPINE MESYLATE 0.5 MG PO TABS: 0.5000 mg | ORAL_TABLET | Freq: Two times a day (BID) | ORAL | Status: DC

## 2015-02-08 MED ORDER — VALACYCLOVIR HCL 500 MG PO TABS
500.0000 mg | ORAL_TABLET | Freq: Every day | ORAL | Status: DC
  Administered 2015-02-08: 500 mg via ORAL
  Filled 2015-02-08 (×2): qty 1

## 2015-02-08 MED ORDER — CLONAZEPAM 1 MG PO TABS
ORAL_TABLET | ORAL | Status: AC
  Administered 2015-02-08: 1 mg via ORAL
  Filled 2015-02-08: qty 1

## 2015-02-08 MED ORDER — CLONAZEPAM 1 MG PO TABS
1.0000 mg | ORAL_TABLET | Freq: Two times a day (BID) | ORAL | Status: DC
  Administered 2015-02-08 – 2015-02-09 (×2): 1 mg via ORAL
  Filled 2015-02-08: qty 1

## 2015-02-08 MED ORDER — LISINOPRIL 10 MG PO TABS
10.0000 mg | ORAL_TABLET | Freq: Every day | ORAL | Status: DC
  Administered 2015-02-09: 10 mg via ORAL
  Filled 2015-02-08: qty 1

## 2015-02-08 MED ORDER — BENZTROPINE MESYLATE 1 MG PO TABS
ORAL_TABLET | ORAL | Status: AC
  Filled 2015-02-08: qty 1

## 2015-02-08 NOTE — ED Notes (Signed)

## 2015-02-08 NOTE — BH Assessment (Signed)
Assessment Note  Brandi Bell is an 54 y.o. female presenting to the ED voluntarily for suicidal ideations without  a plan.  Pt reports she was referred by her psychiatrist, Dr. Jacqualin Combes, for inpatient hospitalization to address symptoms of major depression.  Pt reports isolating self from others, "crying all day", loss of appetite and sleeping all day.  Pt reports she is "tired" and "can't go on any longer".  Pt reports not wanting to harm herself but did "not no what else to do".  Pt reports she was hospitalized  two years ago for depression and suicidal ideations.  Pt states she thought she could "snap out it" but was unable to do so this time.  Axis I: Bipolar, Depressed, Major Depression, Recurrent severe and Post Traumatic Stress Disorder Axis II: Deferred Axis III:  Past Medical History  Diagnosis Date  . Depression   . Suicide attempt   . Microhematuria   . History of migraine headaches   . GERD (gastroesophageal reflux disease)   . Rhinitis   . Bipolar disorder   . PTSD (post-traumatic stress disorder)   . Anxiety   . Arthritis     Feet, Hands  . Spinal stenosis of cervical region   . Bulging lumbar disc   . Shortness of breath dyspnea   . Elevated LFTs     having ultrasound [facility] - 12/14/14  . Restless leg syndrome   . Bipolar disorder   . Asthma     [facility] admission - on Bipap - 7/13 - report on paper chart  . PONV (postoperative nausea and vomiting)   . Motion sickness   . Hypertension     Echo -1/16 - report on paper chart   Axis IV: problems related to social environment Axis V: 51-60 moderate symptoms  Past Medical History:  Past Medical History  Diagnosis Date  . Depression   . Suicide attempt   . Microhematuria   . History of migraine headaches   . GERD (gastroesophageal reflux disease)   . Rhinitis   . Bipolar disorder   . PTSD (post-traumatic stress disorder)   . Anxiety   . Arthritis     Feet, Hands  . Spinal stenosis of cervical region   .  Bulging lumbar disc   . Shortness of breath dyspnea   . Elevated LFTs     having ultrasound [facility] - 12/14/14  . Restless leg syndrome   . Bipolar disorder   . Asthma     [facility] admission - on Bipap - 7/13 - report on paper chart  . PONV (postoperative nausea and vomiting)   . Motion sickness   . Hypertension     Echo -1/16 - report on paper chart    Past Surgical History  Procedure Laterality Date  . Vaginal hysterectomy    . Tubaligation    . Cholecystectomy    . Foot surgery      multiple  . Septoplasty    . Anterior and posterior repair    . Bladder tact    . Shoulder arthroscopy    . Tonsillectomy    . Cardiac catheterization  2008    [facility] - neg - report in paper chart  . Colonoscopy N/A 01/04/2015    Procedure: COLONOSCOPY;  Surgeon: Lucilla Lame, MD;  Location: [place];  Service: Gastroenterology;  Laterality: N/A;  with biopsies    Family History:  Family History  Problem Relation Age of Onset  . Asthma Mother   .  Cancer Mother   . Diabetes Mother   . Heart attack Father     Social History:  reports that she has never smoked. She does not have any smokeless tobacco history on file. She reports that she does not drink alcohol. Her drug history is not on file.  Additional Social History:  Alcohol / Drug Use History of alcohol / drug use?: No history of alcohol / drug abuse  CIWA: CIWA-Ar BP: 121/78 mmHg Pulse Rate: 73 COWS:    Allergies:  Allergies  Allergen Reactions  . Ciprofloxacin Anaphylaxis  . Amoxicillin Hives  . Codeine Nausea And Vomiting  . Morphine And Related Nausea And Vomiting  . Pineapple Other (See Comments)    Reaction:  Mouth blisters  . Sulfa Antibiotics Hives  . Tape Other (See Comments)    Reaction:  Clear tape tears skin.  Tegaderm and paper tape are ok.      Home Medications:  (Not in a hospital admission)  OB/GYN Status:  No LMP recorded. Patient has had a hysterectomy.  General Assessment Data Location of  Assessment: [facility] ED TTS Assessment: In system Is this a Tele or Face-to-Face Assessment?: Face-to-Face Is this an Initial Assessment or a Re-assessment for this encounter?: Initial Assessment Marital status: Single Maiden name: Naser Is patient pregnant?: No Pregnancy Status: No Living Arrangements: Alone Can pt return to current living arrangement?: Yes Admission Status: Voluntary Is patient capable of signing voluntary admission?: Yes Referral Source: Psychiatrist Insurance type: Medicare  Medical Screening Exam ([place]) Medical Exam completed: Yes  Crisis Care Plan Living Arrangements: Alone Name of Psychiatrist: Dr. Jacqualin Combes Name of Therapist: Dr. Jacqualin Combes  Education Status Is patient currently in school?: No Highest grade of school patient has completed: college  Risk to self with the past 6 months Suicidal Ideation: No Has patient been a risk to self within the past 6 months prior to admission? : No Suicidal Intent: No Has patient had any suicidal intent within the past 6 months prior to admission? : No Is patient at risk for suicide?: No Suicidal Plan?: No Has patient had any suicidal plan within the past 6 months prior to admission? : No Access to Means: No What has been your use of drugs/alcohol within the last 12 months?: None reported Previous Attempts/Gestures: No How many times?: 0 Other Self Harm Risks: None Triggers for Past Attempts: Other (Comment) Intentional Self Injurious Behavior: None Family Suicide History: No Recent stressful life event(s): Financial Problems, Loss (Comment) (Loss of friends/boyfriend) Persecutory voices/beliefs?: No Depression: Yes Depression Symptoms: Despondent, Tearfulness, Isolating, Fatigue, Loss of interest in usual pleasures, Feeling worthless/self pity Substance abuse history and/or treatment for substance abuse?: No Suicide prevention information given to non-admitted patients: Not applicable  Risk to Others  within the past 6 months Homicidal Ideation: No Does patient have any lifetime risk of violence toward others beyond the six months prior to admission? : No Thoughts of Harm to Others: No Current Homicidal Intent: No Current Homicidal Plan: No Access to Homicidal Means: No Identified Victim: None History of harm to others?: No Assessment of Violence: On admission Violent Behavior Description: None Does patient have access to weapons?: No Criminal Charges Pending?: No Does patient have a court date: No Is patient on probation?: No  Psychosis Hallucinations: None noted Delusions: None noted  Mental Status Report Appearance/Hygiene: In scrubs Eye Contact: Good Motor Activity: Freedom of movement Speech: Soft, Logical/coherent Level of Consciousness: Alert, Crying Mood: Depressed, Anxious, Despair, Sad, Worthless, low self-esteem Affect:  Depressed, Sad Anxiety Level: Moderate Thought Processes: Coherent Judgement: Unimpaired Orientation: Person, Place, Time, Situation Obsessive Compulsive Thoughts/Behaviors: None  Cognitive Functioning Concentration: Normal Memory: Recent Intact IQ: Average Insight: Good Impulse Control: Good Appetite: Poor Weight Loss: 10 Weight Gain: 0 Sleep: Increased Total Hours of Sleep: 12 Vegetative Symptoms: None  ADLScreening [facility] Assessment Services) Patient's cognitive ability adequate to safely complete daily activities?: Yes Patient able to express need for assistance with ADLs?: Yes Independently performs ADLs?: Yes (appropriate for developmental age)  Prior Inpatient Therapy Prior Inpatient Therapy: Yes Prior Therapy Facilty/Provider(s): [facility] Reason for Treatment: depression  Prior Outpatient Therapy Prior Outpatient Therapy: Yes Prior Therapy Dates: 02/07/2015 Prior Therapy Facilty/Provider(s): Dr. Jacqualin Combes Reason for Treatment: depression Does patient have an ACCT team?: No Does patient have Intensive In-House Services?  :  No Does patient have [facility] services? : No Does patient have P4CC services?: No  ADL Screening (condition at time of admission) Patient's cognitive ability adequate to safely complete daily activities?: Yes Patient able to express need for assistance with ADLs?: Yes Independently performs ADLs?: Yes (appropriate for developmental age)       Abuse/Neglect Assessment (Assessment to be complete while patient is alone) Physical Abuse: Denies Verbal Abuse: Denies Sexual Abuse: Denies Exploitation of patient/patient's resources: Denies Self-Neglect: Denies Values / Beliefs Cultural Requests During Hospitalization: None Spiritual Requests During Hospitalization: None Consults Spiritual Care Consult Needed: No Social Work Consult Needed: No Regulatory affairs officer (For Healthcare) Does patient have an advance directive?: No Would patient like information on creating an advanced directive?: No - patient declined information    Additional Information 1:1 In Past 12 Months?: No CIRT Risk: No Elopement Risk: No     Disposition:  Disposition Initial Assessment Completed for this Encounter: Yes Disposition of Patient: Other dispositions Other disposition(s): Other (Comment) (Psych MD Consult)  On Site Evaluation by:   Reviewed with Physician:    Oneita Hurt 02/08/2015 9:34 PM

## 2015-02-08 NOTE — ED Provider Notes (Signed)
[facility] Emergency Department Provider Note  ____________________________________________  Time seen: Approximately 8:51 PM  I have reviewed the triage vital signs and the nursing notes.   HISTORY  Chief Complaint Medical Clearance    HPI Brandi Bell is a 54 y.o. female patient reports the last few days she's felt very upset and depressed she's not sure why nothing bad is happening with her she is doing everything she supposed to be doing that isn't feeling well and isn't hungry because she is feeling upset and depressed. She has a history of asthma but that is under control and doing well. Patient wants to not have to deal with this anymore wants to end it all but does not have a plan.   Past Medical History  Diagnosis Date  . Depression   . Suicide attempt   . Microhematuria   . History of migraine headaches   . GERD (gastroesophageal reflux disease)   . Rhinitis   . Bipolar disorder   . PTSD (post-traumatic stress disorder)   . Anxiety   . Arthritis     Feet, Hands  . Spinal stenosis of cervical region   . Bulging lumbar disc   . Shortness of breath dyspnea   . Elevated LFTs     having ultrasound [facility] - 12/14/14  . Restless leg syndrome   . Bipolar disorder   . Asthma     [facility] admission - on Bipap - 7/13 - report on paper chart  . PONV (postoperative nausea and vomiting)   . Motion sickness   . Hypertension     Echo -1/16 - report on paper chart    Patient Active Problem List   Diagnosis Date Noted  . Hx of colonic polyps   . Abnormal liver enzymes 12/11/2014  . Rectal burning 12/11/2014  . Stool incontinence 12/11/2014    Past Surgical History  Procedure Laterality Date  . Vaginal hysterectomy    . Tubaligation    . Cholecystectomy    . Foot surgery      multiple  . Septoplasty    . Anterior and posterior repair    . Bladder tact    . Shoulder arthroscopy    . Tonsillectomy    . Cardiac catheterization  2008    [facility]  - neg - report in paper chart  . Colonoscopy N/A 01/04/2015    Procedure: COLONOSCOPY;  Surgeon: Lucilla Lame, MD;  Location: [place];  Service: Gastroenterology;  Laterality: N/A;  with biopsies    Current Outpatient Rx  Name  Route  Sig  Dispense  Refill  . albuterol (PROVENTIL HFA;VENTOLIN HFA) 108 (90 BASE) MCG/ACT inhaler   Inhalation   Inhale 1-2 puffs into the lungs every 6 (six) hours as needed for wheezing or shortness of breath.          . ARIPiprazole (ABILIFY) 20 MG tablet   Oral   Take 20 mg by mouth daily.         . benztropine (COGENTIN) 0.5 MG tablet   Oral   Take 0.5-1 mg by mouth 2 (two) times daily.         . Cariprazine HCl (VRAYLAR) 3 MG CAPS   Oral   Take 1 capsule by mouth daily.         . clonazePAM (KLONOPIN) 1 MG tablet   Oral   Take 1 mg by mouth every 12 (twelve) hours.         . gabapentin (NEURONTIN)  600 MG tablet   Oral   Take 600-1,800 mg by mouth at bedtime.         Marland Kitchen HYDROcodone-acetaminophen (NORCO) 10-325 MG per tablet   Oral   Take 1 tablet by mouth every 6 (six) hours as needed for severe pain.         Marland Kitchen lisinopril (PRINIVIL,ZESTRIL) 10 MG tablet   Oral   Take 10 mg by mouth daily.          . ondansetron (ZOFRAN-ODT) 8 MG disintegrating tablet   Oral   Take 8 mg by mouth every 8 (eight) hours as needed for nausea or vomiting.         . pantoprazole (PROTONIX) 40 MG tablet   Oral   Take 40 mg by mouth daily.          . rizatriptan (MAXALT-MLT) 10 MG disintegrating tablet   Oral   Take 10 mg by mouth as needed for migraine. May repeat in 2 hours if needed         . traMADol (ULTRAM) 50 MG tablet   Oral   Take 100 mg by mouth 2 (two) times daily.         . valACYclovir (VALTREX) 500 MG tablet   Oral   Take 500 mg by mouth daily.          . Vitamin D, Ergocalciferol, (DRISDOL) 50000 UNITS CAPS capsule   Oral   Take 50,000 Units by mouth every 7 (seven) days.         .  diphenoxylate-atropine (LOMOTIL) 2.5-0.025 MG per tablet   Oral   Take 1-2 tablets by mouth 4 (four) times daily as needed for diarrhea or loose stools. Patient not taking: Reported on 02/08/2015   240 tablet   3   . HYDROcodone-acetaminophen (NORCO/VICODIN) 5-325 MG per tablet   Oral   Take 1 tablet by mouth every 6 (six) hours as needed for moderate pain or severe pain. Patient not taking: Reported on 02/08/2015   10 tablet   0   . nystatin (MYCOSTATIN) 100000 UNIT/ML suspension   Oral   Take 5 mLs (500,000 Units total) by mouth 4 (four) times daily. Swish and swallow x 14 days Patient not taking: Reported on 02/08/2015   280 mL   0   . phenazopyridine (PYRIDIUM) 200 MG tablet   Oral   Take 1 tablet (200 mg total) by mouth 3 (three) times daily. Patient not taking: Reported on 02/08/2015   6 tablet   0   . tamsulosin (FLOMAX) 0.4 MG CAPS capsule   Oral   Take 1 capsule (0.4 mg total) by mouth daily. Patient not taking: Reported on 02/08/2015   14 capsule   0     Allergies Ciprofloxacin; Amoxicillin; Codeine; Morphine and related; Pineapple; Sulfa antibiotics; and Tape  Family History  Problem Relation Age of Onset  . Asthma Mother   . Cancer Mother   . Diabetes Mother   . Heart attack Father     Social History History  Substance Use Topics  . Smoking status: Never Smoker   . Smokeless tobacco: Not on file  . Alcohol Use: No    Review of Systems Constitutional: No fever/chills Eyes: No visual changes. ENT: No sore throat. Cardiovascular: Denies chest pain. Respiratory: Denies shortness of breath. Gastrointestinal: No abdominal pain.  No nausea, no vomiting.  No diarrhea.  No constipation. Genitourinary: Negative for dysuria. Musculoskeletal: Negative for back pain. Skin: Negative for rash. Neurological: Negative  for headaches, focal weakness or numbness.  10-point ROS otherwise negative.  ____________________________________________   PHYSICAL  EXAM:  VITAL SIGNS: ED Triage Vitals  Enc Vitals Group     BP 02/08/15 1954 121/78 mmHg     Pulse Rate 02/08/15 1954 73     Resp 02/08/15 1954 18     Temp 02/08/15 1954 98.1 F (36.7 C)     Temp Source 02/08/15 1954 Oral     SpO2 02/08/15 1954 100 %     Weight 02/08/15 1954 210 lb (95.255 kg)     Height 02/08/15 1954 5\' 1"  (1.549 m)     Head Cir --      Peak Flow --      Pain Score 02/08/15 1955 0     Pain Loc --      Pain Edu? --      Excl. in [place]? --     Constitutional: Alert and oriented. Well appearing and in no acute distress. Eyes: Conjunctivae are normal. PERRL. EOMI. Head: Atraumatic. Nose: No congestion/rhinnorhea. Mouth/Throat: Mucous membranes are moist.  Oropharynx non-erythematous. Neck: No stridor.  Cardiovascular: Normal rate, regular rhythm. Grossly normal heart sounds.  Good peripheral circulation. Respiratory: Normal respiratory effort.  No retractions. Lungs CTAB. Gastrointestinal: Soft and nontender. No distention. No abdominal bruits. No CVA tenderness. Musculoskeletal: No lower extremity tenderness nor edema.  No joint effusions. Neurologic:  Normal speech and language. No gross focal neurologic deficits are appreciated. No gait instability. Skin:  Skin is warm, dry and intact. No rash noted. Psychiatric: Mood and affect are normal. Speech and behavior are normal.  ____________________________________________   LABS (all labs ordered are listed, but only abnormal results are displayed)  Labs Reviewed  COMPREHENSIVE METABOLIC PANEL - Abnormal; Notable for the following:    Total Bilirubin 0.2 (*)    All other components within normal limits  ACETAMINOPHEN LEVEL - Abnormal; Notable for the following:    Acetaminophen (Tylenol), Serum <10 (*)    All other components within normal limits  URINE DRUG SCREEN, QUALITATIVE ([facility] ONLY) - Abnormal; Notable for the following:    Opiate, Ur Screen POSITIVE (*)    Benzodiazepine, Ur Scrn POSITIVE (*)    All  other components within normal limits  ETHANOL  SALICYLATE LEVEL  CBC   ____________________________________________  EKG   ____________________________________________  RADIOLOGY   ____________________________________________   PROCEDURES    ____________________________________________   INITIAL IMPRESSION / ASSESSMENT AND PLAN / ED COURSE  Pertinent labs & imaging results that were available during my care of the patient were reviewed by me and considered in my medical decision making (see chart for details).   ____________________________________________   FINAL CLINICAL IMPRESSION(S) / ED DIAGNOSES  Final diagnoses:  Suicidal ideation      Nena Polio, MD 02/08/15 2054

## 2015-02-08 NOTE — ED Notes (Signed)
Patient reports here for Beh Med Evaluation.  Reports having thoughts of self harm, no plan or attempt.  States working to regulate medications for approx 6 weeks.

## 2015-02-09 ENCOUNTER — Inpatient Hospital Stay
Admission: RE | Admit: 2015-02-09 | Discharge: 2015-02-11 | DRG: 885 | Disposition: A | Discharge status: Home / Self Care | Payer: Medicare Other | Source: Intra-hospital | Attending: Psychiatry | Admitting: Psychiatry

## 2015-02-09 ENCOUNTER — Encounter: Payer: Self-pay | Admitting: *Deleted

## 2015-02-09 DIAGNOSIS — F329 Major depressive disorder, single episode, unspecified: Secondary | ICD-10-CM | POA: Diagnosis not present

## 2015-02-09 DIAGNOSIS — E559 Vitamin D deficiency, unspecified: Secondary | ICD-10-CM | POA: Diagnosis present

## 2015-02-09 DIAGNOSIS — G894 Chronic pain syndrome: Secondary | ICD-10-CM | POA: Diagnosis present

## 2015-02-09 DIAGNOSIS — Z7952 Long term (current) use of systemic steroids: Secondary | ICD-10-CM | POA: Diagnosis not present

## 2015-02-09 DIAGNOSIS — R45851 Suicidal ideations: Secondary | ICD-10-CM | POA: Diagnosis present

## 2015-02-09 DIAGNOSIS — J45909 Unspecified asthma, uncomplicated: Secondary | ICD-10-CM | POA: Diagnosis present

## 2015-02-09 DIAGNOSIS — Z833 Family history of diabetes mellitus: Secondary | ICD-10-CM

## 2015-02-09 DIAGNOSIS — F515 Nightmare disorder: Secondary | ICD-10-CM | POA: Diagnosis present

## 2015-02-09 DIAGNOSIS — Z79899 Other long term (current) drug therapy: Secondary | ICD-10-CM

## 2015-02-09 DIAGNOSIS — F315 Bipolar disorder, current episode depressed, severe, with psychotic features: Secondary | ICD-10-CM | POA: Diagnosis present

## 2015-02-09 DIAGNOSIS — F4310 Post-traumatic stress disorder, unspecified: Secondary | ICD-10-CM | POA: Diagnosis present

## 2015-02-09 DIAGNOSIS — Z825 Family history of asthma and other chronic lower respiratory diseases: Secondary | ICD-10-CM

## 2015-02-09 DIAGNOSIS — Z90710 Acquired absence of both cervix and uterus: Secondary | ICD-10-CM | POA: Diagnosis not present

## 2015-02-09 DIAGNOSIS — B009 Herpesviral infection, unspecified: Secondary | ICD-10-CM | POA: Diagnosis present

## 2015-02-09 DIAGNOSIS — K219 Gastro-esophageal reflux disease without esophagitis: Secondary | ICD-10-CM | POA: Diagnosis present

## 2015-02-09 DIAGNOSIS — F603 Borderline personality disorder: Secondary | ICD-10-CM

## 2015-02-09 DIAGNOSIS — G2581 Restless legs syndrome: Secondary | ICD-10-CM | POA: Diagnosis present

## 2015-02-09 DIAGNOSIS — Z8249 Family history of ischemic heart disease and other diseases of the circulatory system: Secondary | ICD-10-CM

## 2015-02-09 DIAGNOSIS — G4700 Insomnia, unspecified: Secondary | ICD-10-CM | POA: Diagnosis present

## 2015-02-09 DIAGNOSIS — Z915 Personal history of self-harm: Secondary | ICD-10-CM

## 2015-02-09 DIAGNOSIS — F4000 Agoraphobia, unspecified: Secondary | ICD-10-CM | POA: Diagnosis present

## 2015-02-09 DIAGNOSIS — F314 Bipolar disorder, current episode depressed, severe, without psychotic features: Secondary | ICD-10-CM

## 2015-02-09 DIAGNOSIS — Z79891 Long term (current) use of opiate analgesic: Secondary | ICD-10-CM

## 2015-02-09 DIAGNOSIS — Z9889 Other specified postprocedural states: Secondary | ICD-10-CM

## 2015-02-09 DIAGNOSIS — M1990 Unspecified osteoarthritis, unspecified site: Secondary | ICD-10-CM

## 2015-02-09 DIAGNOSIS — F419 Anxiety disorder, unspecified: Secondary | ICD-10-CM | POA: Diagnosis present

## 2015-02-09 DIAGNOSIS — Z809 Family history of malignant neoplasm, unspecified: Secondary | ICD-10-CM | POA: Diagnosis not present

## 2015-02-09 DIAGNOSIS — I10 Essential (primary) hypertension: Secondary | ICD-10-CM

## 2015-02-09 DIAGNOSIS — Z86010 Personal history of colonic polyps: Secondary | ICD-10-CM

## 2015-02-09 DIAGNOSIS — G43909 Migraine, unspecified, not intractable, without status migrainosus: Secondary | ICD-10-CM | POA: Diagnosis present

## 2015-02-09 DIAGNOSIS — Z9049 Acquired absence of other specified parts of digestive tract: Secondary | ICD-10-CM | POA: Diagnosis present

## 2015-02-09 DIAGNOSIS — F3181 Bipolar II disorder: Secondary | ICD-10-CM | POA: Diagnosis present

## 2015-02-09 DIAGNOSIS — Z91410 Personal history of adult physical and sexual abuse: Secondary | ICD-10-CM

## 2015-02-09 DIAGNOSIS — Z62810 Personal history of physical and sexual abuse in childhood: Secondary | ICD-10-CM | POA: Diagnosis present

## 2015-02-09 LAB — TSH: TSH: 1.476 u[IU]/mL (ref 0.350–4.500)

## 2015-02-09 MED ORDER — CARIPRAZINE HCL 3 MG PO CAPS: 1.0000 | ORAL_CAPSULE | Freq: Every day | ORAL | Status: DC

## 2015-02-09 MED ORDER — ALUM & MAG HYDROXIDE-SIMETH 200-200-20 MG/5ML PO SUSP
30.0000 mL | ORAL | Status: DC | PRN
  Administered 2015-02-11: 30 mL via ORAL
  Filled 2015-02-09: qty 30

## 2015-02-09 MED ORDER — OXYCODONE-ACETAMINOPHEN 5-325 MG PO TABS
1.0000 | ORAL_TABLET | Freq: Four times a day (QID) | ORAL | Status: DC
  Administered 2015-02-09: 1 via ORAL
  Filled 2015-02-09 (×2): qty 1

## 2015-02-09 MED ORDER — ONDANSETRON 8 MG PO TBDP
8.0000 mg | ORAL_TABLET | Freq: Three times a day (TID) | ORAL | Status: DC | PRN
  Filled 2015-02-09: qty 1

## 2015-02-09 MED ORDER — ARIPIPRAZOLE 10 MG PO TABS
20.0000 mg | ORAL_TABLET | Freq: Every day | ORAL | Status: DC
  Administered 2015-02-10 – 2015-02-11 (×2): 20 mg via ORAL
  Filled 2015-02-09 (×2): qty 2

## 2015-02-09 MED ORDER — ALBUTEROL SULFATE HFA 108 (90 BASE) MCG/ACT IN AERS: 1.0000 | INHALATION_SPRAY | Freq: Four times a day (QID) | RESPIRATORY_TRACT | Status: DC | PRN

## 2015-02-09 MED ORDER — ARIPIPRAZOLE 10 MG PO TABS
20.0000 mg | ORAL_TABLET | Freq: Every day | ORAL | Status: DC
  Administered 2015-02-09: 20 mg via ORAL
  Filled 2015-02-09: qty 2

## 2015-02-09 MED ORDER — MAGNESIUM HYDROXIDE 400 MG/5ML PO SUSP: 30.0000 mL | Freq: Every day | ORAL | Status: DC | PRN

## 2015-02-09 MED ORDER — PANTOPRAZOLE SODIUM 40 MG PO TBEC
40.0000 mg | DELAYED_RELEASE_TABLET | Freq: Every day | ORAL | Status: DC
  Administered 2015-02-10 – 2015-02-11 (×2): 40 mg via ORAL
  Filled 2015-02-09 (×2): qty 1

## 2015-02-09 MED ORDER — TRAZODONE HCL 50 MG PO TABS
50.0000 mg | ORAL_TABLET | Freq: Every evening | ORAL | Status: DC | PRN
  Administered 2015-02-10: 50 mg via ORAL
  Filled 2015-02-09: qty 1

## 2015-02-09 MED ORDER — PANTOPRAZOLE SODIUM 40 MG PO TBEC
40.0000 mg | DELAYED_RELEASE_TABLET | Freq: Every day | ORAL | Status: DC
  Administered 2015-02-09: 40 mg via ORAL
  Filled 2015-02-09: qty 1

## 2015-02-09 MED ORDER — VALACYCLOVIR HCL 500 MG PO TABS
500.0000 mg | ORAL_TABLET | Freq: Every day | ORAL | Status: DC
  Administered 2015-02-09 – 2015-02-10 (×2): 500 mg via ORAL
  Filled 2015-02-09 (×3): qty 1

## 2015-02-09 MED ORDER — ACETAMINOPHEN 325 MG PO TABS: 650.0000 mg | ORAL_TABLET | Freq: Four times a day (QID) | ORAL | Status: DC | PRN

## 2015-02-09 MED ORDER — SUMATRIPTAN SUCCINATE 50 MG PO TABS
50.0000 mg | ORAL_TABLET | ORAL | Status: DC | PRN
  Filled 2015-02-09: qty 1

## 2015-02-09 MED ORDER — LISINOPRIL 10 MG PO TABS
10.0000 mg | ORAL_TABLET | Freq: Every day | ORAL | Status: DC
  Administered 2015-02-10 – 2015-02-11 (×2): 10 mg via ORAL
  Filled 2015-02-09 (×2): qty 2
  Filled 2015-02-09: qty 1

## 2015-02-09 MED ORDER — SUMATRIPTAN SUCCINATE 50 MG PO TABS: 50.0000 mg | ORAL_TABLET | ORAL | Status: DC | PRN

## 2015-02-09 MED ORDER — ALBUTEROL SULFATE HFA 108 (90 BASE) MCG/ACT IN AERS
1.0000 | INHALATION_SPRAY | Freq: Four times a day (QID) | RESPIRATORY_TRACT | Status: DC | PRN
Start: 2015-02-09 — End: 2015-02-11
  Filled 2015-02-09: qty 6.7

## 2015-02-09 MED ORDER — CLONAZEPAM 1 MG PO TABS
1.0000 mg | ORAL_TABLET | Freq: Two times a day (BID) | ORAL | Status: DC
  Administered 2015-02-09 – 2015-02-11 (×4): 1 mg via ORAL
  Filled 2015-02-09 (×4): qty 1

## 2015-02-09 MED ORDER — RIZATRIPTAN BENZOATE 10 MG PO TBDP: 10.0000 mg | ORAL_TABLET | ORAL | Status: DC | PRN

## 2015-02-09 NOTE — ED Notes (Signed)
BEHAVIORAL HEALTH ROUNDING Patient sleeping: No. Patient alert and oriented: no Behavior appropriate: Yes.  ; If no, describe:  Nutrition and fluids offered: Yes  Toileting and hygiene offered: Yes  Sitter present: no Law enforcement present: Yes  
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
## 2015-02-09 NOTE — ED Notes (Signed)

## 2015-02-09 NOTE — ED Notes (Signed)
Pt given breakfast tray, vitals taken.

## 2015-02-09 NOTE — ED Notes (Signed)
BEHAVIORAL HEALTH ROUNDING Patient sleeping: No. Patient alert and oriented: yes Behavior appropriate: Yes.  ; If no, describe:  Nutrition and fluids offered: No Toileting and hygiene offered: Yes  Sitter present: no Law enforcement present: Yes  

## 2015-02-09 NOTE — ED Notes (Signed)
BEHAVIORAL HEALTH ROUNDING Patient sleeping: Yes.   Patient alert and oriented: no Behavior appropriate: Yes.  ; If no, describe:  Nutrition and fluids offered: Yes  Toileting and hygiene offered: Yes  Sitter present: no Law enforcement present: Yes  

## 2015-02-09 NOTE — ED Notes (Signed)
BEHAVIORAL HEALTH ROUNDING Patient sleeping: No. Patient alert and oriented: yes Behavior appropriate: Yes.  ; If no, describe:  Nutrition and fluids offered: Yes  Toileting and hygiene offered: Yes  Sitter present: no Law enforcement present: Yes  

## 2015-02-09 NOTE — ED Notes (Signed)
Pt. Tearful in room.  Patient stated "I should not have come here".  I told pt. That something has happened to bring her here and it would worth talking to a professional in the morning, pt. Agreed and stated she would try to get some sleep.  Pt. Calm and cooperative.  Also got the pt. Another blanket upon request.

## 2015-02-09 NOTE — ED Notes (Signed)
Hand off report given to Matt, RN.

## 2015-02-09 NOTE — Tx Team (Signed)
Initial Interdisciplinary Treatment Plan   PATIENT STRESSORS: Financial difficulties Health problems   PATIENT STRENGTHS: Ability for insight Communication skills General fund of knowledge Motivation for treatment/growth Supportive family/friends   PROBLEM LIST: Problem List/Patient Goals Date to be addressed Date deferred Reason deferred Estimated date of resolution  Bipolar disorder 02/09/15     Depression 02/09/15                                                DISCHARGE CRITERIA:  Improved stabilization in mood, thinking, and/or behavior  PRELIMINARY DISCHARGE PLAN: Outpatient therapy  PATIENT/FAMIILY INVOLVEMENT: This treatment plan has been presented to and reviewed with the patient, Brandi Bell, and/or family member, .  The patient and family have been given the opportunity to ask questions and make suggestions.  Brandi Bell 02/09/2015, 4:51 PM

## 2015-02-09 NOTE — ED Notes (Signed)
ED BHU PLACEMENT JUSTIFICATION Is the patient under IVC or is there intent for IVC: No. Is the patient medically cleared: Yes.   Is there vacancy in the ED BHU: Yes.   Is the population mix appropriate for patient: Yes.   Is the patient awaiting placement in inpatient or outpatient setting: Yes.   Has the patient had a psychiatric consult: No. Survey of unit performed for contraband, proper placement and condition of furniture, tampering with fixtures in bathroom, shower, and each patient room: Yes.  ; Findings:  APPEARANCE/BEHAVIOR calm, cooperative and adequate rapport can be established NEURO ASSESSMENT Orientation: time, place and person Hallucinations: No.None noted (Hallucinations) Speech: Normal Gait: normal RESPIRATORY ASSESSMENT Normal expansion.  Clear to auscultation.  No rales, rhonchi, or wheezing. CARDIOVASCULAR ASSESSMENT regular rate and rhythm, S1, S2 normal, no murmur, click, rub or gallop GASTROINTESTINAL ASSESSMENT soft, nontender, BS WNL, no r/g EXTREMITIES normal strength, tone, and muscle mass PLAN OF CARE Provide calm/safe environment. Vital signs assessed twice daily. ED BHU Assessment once each 12-hour shift. Collaborate with intake RN daily or as condition indicates. Assure the ED provider has rounded once each shift. Provide and encourage hygiene. Provide redirection as needed. Assess for escalating behavior; address immediately and inform ED provider.  Assess family dynamic and appropriateness for visitation as needed: Yes.  ; If necessary, describe findings:  Educate the patient/family about BHU procedures/visitation: Yes.  ; If necessary, describe findings:  

## 2015-02-09 NOTE — ED Notes (Signed)
BEHAVIORAL HEALTH ROUNDING Patient sleeping: No. Patient alert and oriented: yes Behavior appropriate: Yes.  ; If no, describe:  Nutrition and fluids offered: no Toileting and hygiene offered: Yes  Sitter present: no Law enforcement present: Yes
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
## 2015-02-09 NOTE — Progress Notes (Signed)
Patient being admitted to inpatient [facility] bed#301 per Ozzie Hoyle, RN charge nurse. Dr. Tami Ribas completed admission orders and the unit is currently ready for the transfer. This Probation officer informed Rush Landmark, RN, EDP, and Olivia Mackie, ED Secretary of the updated disposition.    Chesley Noon, MSW, LCSW, [place] Clinical Social Worker [phone]

## 2015-02-09 NOTE — ED Notes (Signed)
BEHAVIORAL HEALTH ROUNDING  Patient sleeping: Yes.  Patient alert and oriented: no  Behavior appropriate: Yes. ; If no, describe:  Nutrition and fluids offered: No  Toileting and hygiene offered: No  Sitter present: no  Law enforcement present: Yes   

## 2015-02-09 NOTE — ED Notes (Signed)
Patient assigned to appropriate care area. Patient oriented to unit/care area: Informed that, for their safety, care areas are designed for safety and monitored by security cameras at all times; and visiting hours explained to patient. Patient verbalizes understanding, and verbal contract for safety obtained. 

## 2015-02-09 NOTE — ED Notes (Signed)
Pt. Up using bathroom.  Asked for some ginger ale, gave her cup of ginger ale.  Pt. Stated bed was not very comfortable.

## 2015-02-09 NOTE — ED Notes (Signed)
BEHAVIORAL HEALTH ROUNDING  Patient sleeping: No  Patient alert and oriented: Yes Behavior appropriate: Yes.  Nutrition and fluids offered: Yes  Toileting and hygiene offered: Yes  Sitter present: yes  Law enforcement present: Yes

## 2015-02-09 NOTE — ED Notes (Signed)
Pharmacy notified via [facility] that cariprazine is not stocked in th ed

## 2015-02-10 DIAGNOSIS — K219 Gastro-esophageal reflux disease without esophagitis: Secondary | ICD-10-CM

## 2015-02-10 DIAGNOSIS — F3181 Bipolar II disorder: Secondary | ICD-10-CM

## 2015-02-10 DIAGNOSIS — M1990 Unspecified osteoarthritis, unspecified site: Secondary | ICD-10-CM

## 2015-02-10 DIAGNOSIS — F603 Borderline personality disorder: Secondary | ICD-10-CM

## 2015-02-10 DIAGNOSIS — I10 Essential (primary) hypertension: Secondary | ICD-10-CM

## 2015-02-10 DIAGNOSIS — J45909 Unspecified asthma, uncomplicated: Secondary | ICD-10-CM

## 2015-02-10 DIAGNOSIS — G894 Chronic pain syndrome: Secondary | ICD-10-CM

## 2015-02-10 LAB — HEMOGLOBIN A1C
Hgb A1c MFr Bld: 5.1 % (ref 4.0–6.0)
Hgb A1c MFr Bld: 5.1 % (ref 4.0–6.0)

## 2015-02-10 LAB — LIPID PANEL
Cholesterol: 239 mg/dL — ABNORMAL HIGH (ref 0–200)
HDL: 62 mg/dL (ref >40)
LDL CALC: 158 mg/dL — AB (ref 0–99)
TRIGLYCERIDES: 95 mg/dL (ref <150)
Total CHOL/HDL Ratio: 3.9 RATIO
VLDL: 19 mg/dL (ref 0–40)

## 2015-02-10 LAB — VITAMIN B12: VITAMIN B 12: 319 pg/mL (ref 180–914)

## 2015-02-10 MED ORDER — ACETAMINOPHEN 500 MG PO TABS
1000.0000 mg | ORAL_TABLET | Freq: Four times a day (QID) | ORAL | Status: DC | PRN
  Filled 2015-02-10: qty 2

## 2015-02-10 MED ORDER — VITAMIN D (ERGOCALCIFEROL) 1.25 MG (50000 UNIT) PO CAPS
50000.0000 [IU] | ORAL_CAPSULE | ORAL | Status: DC
  Filled 2015-02-10: qty 1

## 2015-02-10 MED ORDER — DICLOFENAC SODIUM 1 % TD GEL
4.0000 g | Freq: Four times a day (QID) | TRANSDERMAL | Status: DC | PRN
  Filled 2015-02-10: qty 100

## 2015-02-10 MED ORDER — VITAMIN D (ERGOCALCIFEROL) 1.25 MG (50000 UNIT) PO CAPS: 50000.0000 [IU] | ORAL_CAPSULE | ORAL | Status: DC

## 2015-02-10 NOTE — BHH Group Notes (Signed)
[place] LCSW Group Therapy  02/10/2015 12:44 PM  Type of Therapy:  Group Therapy  Participation Level:  Did Not Attend  Modes of Intervention:  Discussion, Education, Socialization and Support  Summary of Progress/Problems:Balance in life: Patients will discuss the concept of balance and how it looks and feels to be unbalanced. Pt will identify areas in their life that is unbalanced and ways to become more balanced.    [place] MSW, [place]  02/10/2015, 12:44 PM
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
## 2015-02-10 NOTE — BHH Group Notes (Signed)
[facility] Group Notes:  (Nursing/MHT/Case Management/Adjunct)  Date:  02/10/2015  Time:  9:37 AM  Type of Therapy:  Psychoeducational Skills  Participation Level:  Active  Participation Quality:  Appropriate  Affect:  Appropriate  Cognitive:  Appropriate  Insight:  Good  Engagement in Group:  Engaged  Modes of Intervention:  Discussion, Education and Exploration  Summary of Progress/Problems:  Brandi Bell 02/10/2015, 9:37 AM
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
## 2015-02-10 NOTE — Progress Notes (Signed)
Patient signed 72 hour at 2110 on 02/10/15.

## 2015-02-10 NOTE — Plan of Care (Signed)
Problem: Ineffective individual coping Goal: LTG: Patient will report a decrease in negative feelings Outcome: Not Progressing Patient still notes depression and hopelessness.  Goal: STG: Pt will be able to identify effective and ineffective STG: Pt will be able to identify effective and ineffective coping patterns  Outcome: Not Progressing Patient states feeling of being overwhelmed. Unable to process effective coping skills at this time.  Goal: STG: Patient will remain free from self harm Outcome: Progressing No self harm.

## 2015-02-10 NOTE — BHH Suicide Risk Assessment (Addendum)
[facility] Admission Suicide Risk Assessment   Nursing information obtained from:  Patient Demographic factors:  Caucasian, Living alone, Unemployed Current Mental Status:  NA Loss Factors:  Financial problems / change in socioeconomic status Historical Factors:  Prior suicide attempts Risk Reduction Factors:  Sense of responsibility to family, Positive social support, Positive coping skills or problem solving skills Total Time spent with patient: 1 hour Principal Problem: Bipolar II disorder, most recent episode major depressive with melancholic features Diagnosis:   Patient Active Problem List   Diagnosis Date Noted  . Borderline personality disorder [F60.3] 02/10/2015  . Asthma, chronic [J45.909] 02/10/2015  . HTN (hypertension) [I10] 02/10/2015  . GERD (gastroesophageal reflux disease) [K21.9] 02/10/2015  . Chronic pain syndrome [G89.4] 02/10/2015  . Arthritis [M19.90] 02/10/2015  . Bipolar II disorder, most recent episode major depressive with melancholic features [V74.73] 02/10/2015  . Hx of colonic polyps [Z86.010]      Continued Clinical Symptoms:  Alcohol Use Disorder Identification Test Final Score (AUDIT): 0 The "Alcohol Use Disorders Identification Test", Guidelines for Use in Primary Care, Second Edition.  World Pharmacologist [facility]). Score between 0-7:  no or low risk or alcohol related problems. Score between 8-15:  moderate risk of alcohol related problems. Score between 16-19:  high risk of alcohol related problems. Score 20 or above:  warrants further diagnostic evaluation for alcohol dependence and treatment.   CLINICAL FACTORS:   Bipolar Disorder:   Depressive phase Previous Psychiatric Diagnoses and Treatments Medical Diagnoses and Treatments/Surgeries   Musculoskeletal: Strength & Muscle Tone: within normal limits Gait & Station: normal Patient leans: N/A  Psychiatric Specialty Exam: Physical Exam  ROS    COGNITIVE FEATURES THAT CONTRIBUTE TO RISK:   None    SUICIDE RISK:   Moderate:  Frequent suicidal ideation with limited intensity, and duration, some specificity in terms of plans, no associated intent, good self-control, limited dysphoria/symptomatology, some risk factors present, and identifiable protective factors, including available and accessible social support.  PLAN OF CARE: admit to [facility] Making:  Established Problem, Worsening (2)  I certify that inpatient services furnished can reasonably be expected to improve the patient's condition.   Hildred Priest 02/10/2015, 1:36 PM

## 2015-02-10 NOTE — BHH Group Notes (Signed)
[facility] Group Notes:  (Nursing/MHT/Case Management/Adjunct)  Date:  02/10/2015  Time:  3:08 AM  Type of Therapy:  Group Therapy  Participation Level:  Did Not Attend   Brandi Bell Brandi Bell 02/10/2015, 3:08 AM

## 2015-02-10 NOTE — Progress Notes (Signed)
Pt has been pleasant and cooperative. Pt continues to endorse having fleeting thoughts of suicide but denies having a plan and is able to contract for safety. Pt has been seclusive to her room most of the day. Pt's mood and affect has been depressed. Pt c/o having some pain on the top part of both feet.

## 2015-02-11 ENCOUNTER — Encounter: Payer: Self-pay | Admitting: Psychiatry

## 2015-02-11 MED ORDER — IBUPROFEN 800 MG PO TABS
800.0000 mg | ORAL_TABLET | Freq: Once | ORAL | Status: AC
  Administered 2015-02-11: 800 mg via ORAL
  Filled 2015-02-11: qty 1

## 2015-02-11 NOTE — BHH Group Notes (Signed)
[facility] LCSW Aftercare Discharge Planning Group Note  02/11/2015 10:29 AM  Participation Quality:  Attentive  Affect:  Appropriate  Cognitive:  Appropriate  Insight:  Engaged  Engagement in Group:  Engaged  Modes of Intervention:  Discussion, Education and Support  Summary of Progress/Problems: Patients goal for today was to meet with doctor and to see about her medications  Pauletta Browns, Jahvier Aldea M 02/11/2015, 10:29 AM

## 2015-02-11 NOTE — Plan of Care (Signed)
Problem: Ineffective individual coping Goal: LTG: Patient will report a decrease in negative feelings Outcome: Progressing Voice  no negativity

## 2015-02-11 NOTE — BHH Group Notes (Signed)
[facility] LCSW Group Therapy  02/11/2015 2:19 PM  Type of Therapy:  Group Therapy  Participation Level:  Did Not Attend  Participation Quality:    Affect:    Cognitive:    Insight:    Engagement in Therapy:    Modes of Intervention:    Summary of Progress/Problems:  Joana Reamer 02/11/2015, 2:19 PM
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
## 2015-02-11 NOTE — Progress Notes (Signed)
Nurs Dischg Note:  D:Patient denies SI/HI at this time. Pt appears calm and cooperative, and no distress noted.  A: All Personal items in locker returned to pt. Pt escorted out of the building.Instruction given on discharge information. Patient received all belongings prescriptions and is aware of  Follow up appointment      R:  Pt States she will comply with outpatient services, and take MEDS as prescribed.Patient aware of discharge this

## 2015-02-11 NOTE — Progress Notes (Signed)
D: Patient denies SI/HI/AVH.   Patient affect and mood is anxious and depressed.  Patient did attend evening group. Patient visible on the milieu. Patient had a tearful episode with complaints of pain in her feet.  MD called for medication. A: Support and encouragement offered. Scheduled medications given to pt. Q 15 min checks continued for patient safety. R: Patient receptive. Patient remains safe on the unit.

## 2015-02-12 NOTE — Progress Notes (Signed)
AVS H&P Discharge Summary faxed to [place] for hospital follow-up

## 2015-02-14 ENCOUNTER — Ambulatory Visit: Payer: Medicare Other | Attending: Physician Assistant | Admitting: Physical Therapy

## 2015-02-14 ENCOUNTER — Encounter: Payer: Self-pay | Admitting: Physical Therapy

## 2015-02-14 DIAGNOSIS — M6281 Muscle weakness (generalized): Secondary | ICD-10-CM

## 2015-02-14 DIAGNOSIS — M25561 Pain in right knee: Secondary | ICD-10-CM | POA: Diagnosis present

## 2015-02-14 DIAGNOSIS — M79672 Pain in left foot: Secondary | ICD-10-CM | POA: Insufficient documentation

## 2015-02-14 DIAGNOSIS — M79671 Pain in right foot: Secondary | ICD-10-CM | POA: Insufficient documentation

## 2015-02-14 DIAGNOSIS — R262 Difficulty in walking, not elsewhere classified: Secondary | ICD-10-CM | POA: Diagnosis present

## 2015-02-14 NOTE — Therapy (Signed)
[place] [facility] [address] [place], [place], [address] Phone: [phone]   Fax:  [phone]  Physical Therapy Evaluation  Patient Details  Name: Brandi Bell MRN: [id number] Date of Birth: December 07, 1960 Referring Provider:  Milus Mallick, MD  Encounter Date: 02/14/2015      PT End of Session - 02/15/15 0842    Visit Number 1   Number of Visits 8   Date for PT Re-Evaluation 03/14/15   Authorization - Visit Number 1   Authorization - Number of Visits 10   PT Start Time 0843   PT Stop Time 0940   PT Time Calculation (min) 57 min   Activity Tolerance No increased pain;Patient tolerated treatment well   Behavior During Therapy [facility] for tasks assessed/performed      Past Medical History  Diagnosis Date  . Depression   . Suicide attempt   . Microhematuria   . History of migraine headaches   . GERD (gastroesophageal reflux disease)   . Rhinitis   . Bipolar disorder   . PTSD (post-traumatic stress disorder)   . Anxiety   . Arthritis     Feet, Hands  . Spinal stenosis of cervical region   . Bulging lumbar disc   . Shortness of breath dyspnea   . Elevated LFTs     having ultrasound [facility] - 12/14/14  . Restless leg syndrome   . Bipolar disorder   . Asthma     [facility] admission - on Bipap - 7/13 - report on paper chart  . PONV (postoperative nausea and vomiting)   . Motion sickness   . Hypertension     Echo -1/16 - report on paper chart    Past Surgical History  Procedure Laterality Date  . Vaginal hysterectomy    . Tubaligation    . Cholecystectomy    . Foot surgery      multiple  . Septoplasty    . Anterior and posterior repair    . Bladder tact    . Shoulder arthroscopy    . Tonsillectomy    . Cardiac catheterization  2008    [facility] - neg - report in paper chart  . Colonoscopy N/A 01/04/2015    Procedure: COLONOSCOPY;  Surgeon: Lucilla Lame, MD;  Location: [place];  Service: Gastroenterology;   Laterality: N/A;  with biopsies    There were no vitals filed for this visit.  Visit Diagnosis:  Difficulty in walking  Right foot pain  Left foot pain  Muscle weakness      Subjective Assessment - 02/14/15 0829    Subjective Pt. states she has B foot pain (top of metatarsal) R>L and ankles reports several near falls/falls due to ankle sprains.  Pt. reports hip pain at this time and states she has been hospitalized for several days due to depression/ suicidal thoughts but left hospital against medical advice a few days ago.  Pt. states she has recently come of a manic episode.     Limitations Sitting;Standing;Walking   Patient Stated Goals decrease pain/ improve stair climbing/ begin a gym based ex. program.    Currently in Pain? Yes   Pain Score 5    Pain Location Foot   Pain Orientation Right;Left   Pain Descriptors / Indicators Aching;Sore;Tender   Pain Type Chronic pain   Pain Onset More than a month ago            [facility] PT Assessment - 02/15/15 0001  Assessment   Medical Diagnosis Polyosteoarthrisis, unspecified   Onset Date/Surgical Date 07/06/14   Prior Therapy see notes   Precautions   Precautions None   Restrictions   Weight Bearing Restrictions No   Balance Screen   Has the patient fallen in the past 6 months Yes   Is the patient reluctant to leave their home because of a fear of falling?  Yes   [place] residence     LEFS: 25 out of 30.          PT Education - 02/15/15 0840    Education provided Yes   Education Details see HEP   Person(s) Educated Patient   Methods Explanation;Demonstration;Handout   Comprehension Verbalized understanding;Returned demonstration;Verbal cues required           PT Long Term Goals - 02/15/15 1046    PT LONG TERM GOAL #1   Title Pt. I with HEP to increase B LE/ankle strength to 5/5 MMT to improve pain-free walking/ stair climbing.     Time 4   Period Weeks   Status New    PT LONG TERM GOAL #2   Title Pt. able to complete 30 minutes of ther.ex. in pool or land with no increase c/o B foot pain to improve mobility.    Baseline limited by R foot pain/ muscle cramping.    Time 4   Period Weeks   Status New   PT LONG TERM GOAL #3   Title Pt. will report no tenderness with palpation to top of foot/ base of metatarsals to improve pain-free mobility.     Time 4   Period Weeks   Status New   PT LONG TERM GOAL #4   Title Pt. will report no episodes of R ankle turning/falls consistently for 1 week to improve safety with walking.   Baseline participating with pool ex.   Time 4   Period Weeks   Status New   PT LONG TERM GOAL #5   Title Pt. will increase LEFS to >40 out of 80 to improve walking/ pain-free mobility.     Baseline LEFS: 25 out of 80 (02/14/15)   Time 4   Period Weeks   Status New            Plan - 02/15/15 0845    Clinical Impression Statement Pt. is a 54 y/o female with chronic h/o multiple joint pain/ polyosteoarthritis.  Pt. is known to PT after recent tx. for B hip pain >2 months ago.  Pt. currently reports 5/10 B foot pain (R>L) and several recent episodes of R ankle turning/fallls without injury.  Increase soreness/ pain with palpation of toe of R foot/ metatarsals as compared to L.  No swelling or bruising noted.  (-) hip/knee tenderness.  Pt. has full B hip/knee/ankle AROM all planes.  B LE muscle strenghth grossly 4+/5 MMT except ankle DF/EV grossly 4/5 MMT with increase R foot pain with hand placement during MMT.  Pt. ambulates with slight R antalgic gait pattern with no assistive device and reports increase difficulty with ascending/descending staris with 1 handrial assist.  Pt. will benefit from skilled PT services to increase ankle/LE strengthening and improve pain-free mobility.       Pt will benefit from skilled therapeutic intervention in order to improve on the following deficits Abnormal gait;Decreased endurance;Decreased  strength;Difficulty walking;Decreased mobility;Decreased balance;Pain;Decreased range of motion;Impaired flexibility;Hypermobility;Obesity;Decreased activity tolerance   Rehab Potential Good   PT Frequency 2x / week  PT Duration 4 weeks   PT Treatment/Interventions Gait training;Neuromuscular re-education;Aquatic Therapy;Functional mobility training;Patient/family education;Cryotherapy;Therapeutic exercise;Manual techniques;Balance training;Stair training;ADLs/Self Care Home Management;Moist Heat   PT Next Visit Plan Progress LE/hip strengthening/ issue pool ex.   PT Home Exercise Plan continue with daily stretching/ icing   Consulted and Agree with Plan of Care Patient          G-Codes - February 17, 2015 1052    Functional Assessment Tool Used Clinical judgement/ pain/ gait/ LEFS   Functional Limitation Mobility: Walking and moving around   Mobility: Walking and Moving Around Current Status [phone]) At least 20 percent but less than 40 percent impaired, limited or restricted   Mobility: Walking and Moving Around Goal Status [phone]) At least 1 percent but less than 20 percent impaired, limited or restricted       Problem List Patient Active Problem List   Diagnosis Date Noted  . Borderline personality disorder 02/10/2015  . Asthma, chronic 02/10/2015  . HTN (hypertension) 02/10/2015  . GERD (gastroesophageal reflux disease) 02/10/2015  . Chronic pain syndrome 02/10/2015  . Arthritis 02/10/2015  . Bipolar II disorder, most recent episode major depressive with melancholic features 02/16/4817  . Hx of colonic polyps    Pura Spice, PT, DPT # [phone]   17-Feb-2015, 10:59 AM  [place] [address] [place], [place], [address] Phone: [phone]   Fax:  [phone]

## 2015-02-19 ENCOUNTER — Ambulatory Visit: Payer: Medicare Other | Admitting: Physical Therapy

## 2015-02-19 ENCOUNTER — Encounter: Payer: Self-pay | Admitting: Physical Therapy

## 2015-02-19 DIAGNOSIS — M79672 Pain in left foot: Secondary | ICD-10-CM

## 2015-02-19 DIAGNOSIS — M6281 Muscle weakness (generalized): Secondary | ICD-10-CM

## 2015-02-19 DIAGNOSIS — R262 Difficulty in walking, not elsewhere classified: Secondary | ICD-10-CM | POA: Diagnosis not present

## 2015-02-19 DIAGNOSIS — M79671 Pain in right foot: Secondary | ICD-10-CM

## 2015-02-19 NOTE — Therapy (Signed)
[place] [facility] [address] [place], [place], [address] Phone: [phone]   Fax:  [phone]  Physical Therapy Treatment  Patient Details  Name: Brandi Bell MRN: [id number] Date of Birth: Jul 20, 1960 Referring Provider:  Milus Mallick, MD  Encounter Date: 02/19/2015      PT End of Session - 02/19/15 1314    Visit Number 2   Number of Visits 8   Date for PT Re-Evaluation 03/14/15   Authorization - Visit Number 2   Authorization - Number of Visits 10   PT Start Time 2671   PT Stop Time 2458   PT Time Calculation (min) 50 min   Activity Tolerance No increased pain;Patient tolerated treatment well   Behavior During Therapy [facility] for tasks assessed/performed      Past Medical History  Diagnosis Date  . Depression   . Suicide attempt   . Microhematuria   . History of migraine headaches   . GERD (gastroesophageal reflux disease)   . Rhinitis   . Bipolar disorder   . PTSD (post-traumatic stress disorder)   . Anxiety   . Arthritis     Feet, Hands  . Spinal stenosis of cervical region   . Bulging lumbar disc   . Shortness of breath dyspnea   . Elevated LFTs     having ultrasound [facility] - 12/14/14  . Restless leg syndrome   . Bipolar disorder   . Asthma     [facility] admission - on Bipap - 7/13 - report on paper chart  . PONV (postoperative nausea and vomiting)   . Motion sickness   . Hypertension     Echo -1/16 - report on paper chart    Past Surgical History  Procedure Laterality Date  . Vaginal hysterectomy    . Tubaligation    . Cholecystectomy    . Foot surgery      multiple  . Septoplasty    . Anterior and posterior repair    . Bladder tact    . Shoulder arthroscopy    . Tonsillectomy    . Cardiac catheterization  2008    [facility] - neg - report in paper chart  . Colonoscopy N/A 01/04/2015    Procedure: COLONOSCOPY;  Surgeon: Lucilla Lame, MD;  Location: [place];  Service: Gastroenterology;   Laterality: N/A;  with biopsies    There were no vitals filed for this visit.  Visit Diagnosis:  Difficulty in walking  Right foot pain  Left foot pain  Muscle weakness      Subjective Assessment - 02/19/15 1309    Subjective Pt states B foot pain 3/10 but over the weekend was limited in actiivty due to pain at 6/10. Pt reports calling MD to address pain over the weekend due to pain. Pt had a medication reaction so went to [facility] over the weekend. Pt reports doing HEP but having increased knee pain with squats.     Limitations Sitting;Standing;Walking   Patient Stated Goals decrease pain/ improve stair climbing/ begin a gym based ex. program.    Currently in Pain? Yes   Pain Score 3    Pain Location Foot   Pain Orientation Right;Left   Pain Descriptors / Indicators Aching;Sore;Tender   Pain Type Chronic pain   Pain Onset More than a month ago   Pain Frequency Intermittent        OBJECTIVE:  There ex: SciFit L5 10 mins F/B (warm-up, no charge). Tandem walking in the //  bars forwards and backwards. Tandem static stance (able to maintain for 30 seconds then complaints of increased L ankle pain). Blue airex: adducted stance/normal stance EO/normal stance EC x 1 min each. Single leg stance on ground for 7 seconds on each side. Gait for increased heel strike and toe off. Pt educated on contrast bath and cause of muscle cramps.  Manual: isometrics: DF/PF/inversion/eversion x 10 each with 10 second holds.     Pt response for medical necessity: Pt is limited with increased pain and cramping for all activities. Continue to address ankle stability.        PT Long Term Goals - 02/15/15 1046    PT LONG TERM GOAL #1   Title Pt. I with HEP to increase B LE/ankle strength to 5/5 MMT to improve pain-free walking/ stair climbing.     Time 4   Period Weeks   Status New   PT LONG TERM GOAL #2   Title Pt. able to complete 30 minutes of ther.ex. in pool or land with no increase  c/o B foot pain to improve mobility.    Baseline limited by R foot pain/ muscle cramping.    Time 4   Period Weeks   Status New   PT LONG TERM GOAL #3   Title Pt. will report no tenderness with palpation to top of foot/ base of metatarsals to improve pain-free mobility.     Time 4   Period Weeks   Status New   PT LONG TERM GOAL #4   Title Pt. will report no episodes of R ankle turning/falls consistently for 1 week to improve safety with walking.   Baseline participating with pool ex.   Time 4   Period Weeks   Status New   PT LONG TERM GOAL #5   Title Pt. will increase LEFS to >40 out of 80 to improve walking/ pain-free mobility.     Baseline LEFS: 25 out of 80 (02/14/15)   Time 4   Period Weeks   Status New            Plan - 02/19/15 1655    Clinical Impression Statement Pt. limited by generalized B hip/LE pain and several episodes of significant c/o cramping with standing balance.  Pt. able to stabilize with good ankle control all planes on Airex and no increase c/o pain.  Increase ankle/foot pain with static tandem stance.     Pt will benefit from skilled therapeutic intervention in order to improve on the following deficits Abnormal gait;Decreased endurance;Decreased strength;Difficulty walking;Decreased mobility;Decreased balance;Pain;Decreased range of motion;Impaired flexibility;Hypermobility;Obesity;Decreased activity tolerance   Rehab Potential Good   PT Frequency 2x / week   PT Duration 4 weeks   PT Treatment/Interventions Gait training;Neuromuscular re-education;Aquatic Therapy;Functional mobility training;Patient/family education;Cryotherapy;Therapeutic exercise;Manual techniques;Balance training;Stair training;ADLs/Self Care Home Management;Moist Heat   PT Next Visit Plan Progress LE/hip strengthening/ issue pool ex.   PT Home Exercise Plan continue with daily stretching/ icing   Consulted and Agree with Plan of Care Patient        Problem List Patient Active  Problem List   Diagnosis Date Noted  . Borderline personality disorder 02/10/2015  . Asthma, chronic 02/10/2015  . HTN (hypertension) 02/10/2015  . GERD (gastroesophageal reflux disease) 02/10/2015  . Chronic pain syndrome 02/10/2015  . Arthritis 02/10/2015  . Bipolar II disorder, most recent episode major depressive with melancholic features 96/78/9381  . Hx of colonic polyps    Pura Spice, PT, DPT # [phone]   02/19/2015, 5:02 PM  [facility] [address] [place], [place], [address] Phone: [phone]   Fax:  [phone]

## 2015-02-21 ENCOUNTER — Encounter: Payer: Self-pay | Admitting: Physical Therapy

## 2015-02-21 ENCOUNTER — Ambulatory Visit: Payer: Medicare Other | Admitting: Physical Therapy

## 2015-02-21 DIAGNOSIS — M79671 Pain in right foot: Secondary | ICD-10-CM

## 2015-02-21 DIAGNOSIS — M79672 Pain in left foot: Secondary | ICD-10-CM

## 2015-02-21 DIAGNOSIS — R262 Difficulty in walking, not elsewhere classified: Secondary | ICD-10-CM

## 2015-02-21 DIAGNOSIS — M6281 Muscle weakness (generalized): Secondary | ICD-10-CM

## 2015-02-21 NOTE — Therapy (Signed)
[place] [facility] [address] [place], [place], [address] Phone: [phone]   Fax:  [phone]  Physical Therapy Treatment  Patient Details  Name: Brandi Bell MRN: [id number] Date of Birth: 18-Nov-1960 Referring Provider:  Milus Mallick, MD  Encounter Date: 02/21/2015      PT End of Session - 02/21/15 1505    Visit Number 3   Number of Visits 8   Date for PT Re-Evaluation 03/14/15   Authorization - Visit Number 3   Authorization - Number of Visits 10   PT Start Time 8182   PT Stop Time 1430   PT Time Calculation (min) 43 min   Activity Tolerance Patient tolerated treatment well;Patient limited by pain   Behavior During Therapy [facility] for tasks assessed/performed      Past Medical History  Diagnosis Date  . Depression   . Suicide attempt   . Microhematuria   . History of migraine headaches   . GERD (gastroesophageal reflux disease)   . Rhinitis   . Bipolar disorder   . PTSD (post-traumatic stress disorder)   . Anxiety   . Arthritis     Feet, Hands  . Spinal stenosis of cervical region   . Bulging lumbar disc   . Shortness of breath dyspnea   . Elevated LFTs     having ultrasound [facility] - 12/14/14  . Restless leg syndrome   . Bipolar disorder   . Asthma     [facility] admission - on Bipap - 7/13 - report on paper chart  . PONV (postoperative nausea and vomiting)   . Motion sickness   . Hypertension     Echo -1/16 - report on paper chart    Past Surgical History  Procedure Laterality Date  . Vaginal hysterectomy    . Tubaligation    . Cholecystectomy    . Foot surgery      multiple  . Septoplasty    . Anterior and posterior repair    . Bladder tact    . Shoulder arthroscopy    . Tonsillectomy    . Cardiac catheterization  2008    [facility] - neg - report in paper chart  . Colonoscopy N/A 01/04/2015    Procedure: COLONOSCOPY;  Surgeon: Lucilla Lame, MD;  Location: [place];  Service: Gastroenterology;   Laterality: N/A;  with biopsies    There were no vitals filed for this visit.  Visit Diagnosis:  Difficulty in walking  Right foot pain  Left foot pain  Muscle weakness      Subjective Assessment - 02/22/15 1541    Subjective Patient reports new pain in her LLE around her hamstring area which started this morning. She states she has been completing her exercises at home, but has been feeling depressed.    Limitations Sitting;Standing;Walking   Patient Stated Goals decrease pain/ improve stair climbing/ begin a gym based ex. program.    Currently in Pain? --  Patient reports discomfort in her left hamstring region, which started after she woke up.        OBJECTIVE:  Manual: Hamstring and gastroc stretching (90-90 stretching with PN contract relax reducing her symptoms), plantar fascia (tenderness with all palpation) (Provided manual A-P grade II-III mobilizations to the talus to increase DF), grade III mobilizations of RLE great toe into extension (well tolerated)  There ex: gait (pt complained of mild pain with walking), heel raises 2 sets x 15 (pt tolerated well no increased c/o pain), instructed  patient in self stretching her hamstring in 90-90 position with activation of quadricep. Patient educated on use of towel stretching for her calf. Her chief complaint of "arthritis" of her metatarsals is consistent with decreased DF ROM and decreased great toe extension placing increased mobility demand on the metatarsals, which are designed as a rigid stable joint.       PT Education - 02/21/15 1429    Education provided Yes   Education Details Pt given seated calf stretching, sit to stands, 90/90 hamstring stretching   Person(s) Educated Patient   Methods Explanation;Demonstration;Handout   Comprehension Verbalized understanding;Verbal cues required          PT Long Term Goals - 02/15/15 1046    PT LONG TERM GOAL #1   Title Pt. I with HEP to increase B LE/ankle strength to 5/5  MMT to improve pain-free walking/ stair climbing.     Time 4   Period Weeks   Status New   PT LONG TERM GOAL #2   Title Pt. able to complete 30 minutes of ther.ex. in pool or land with no increase c/o B foot pain to improve mobility.    Baseline limited by R foot pain/ muscle cramping.    Time 4   Period Weeks   Status New   PT LONG TERM GOAL #3   Title Pt. will report no tenderness with palpation to top of foot/ base of metatarsals to improve pain-free mobility.     Time 4   Period Weeks   Status New   PT LONG TERM GOAL #4   Title Pt. will report no episodes of R ankle turning/falls consistently for 1 week to improve safety with walking.   Baseline participating with pool ex.   Time 4   Period Weeks   Status New   PT LONG TERM GOAL #5   Title Pt. will increase LEFS to >40 out of 80 to improve walking/ pain-free mobility.     Baseline LEFS: 25 out of 80 (02/14/15)   Time 4   Period Weeks   Status New          Plan - 02/21/15 1505    Clinical Impression Statement Patient presents initially very tearful and admits she is in depression, though no ideations. Patient demonstrates good response to stretching and exercises provided, and responds well to manual techniques and therapeutic neuroscience education. Patient reports feeling much better upon leaving PT session after exercises were completed. Skilled PT services continue to be indicated to address her pain and balance deficits.    Pt will benefit from skilled therapeutic intervention in order to improve on the following deficits Abnormal gait;Decreased endurance;Decreased strength;Difficulty walking;Decreased mobility;Decreased balance;Pain;Decreased range of motion;Impaired flexibility;Hypermobility;Obesity;Decreased activity tolerance   Rehab Potential Good   PT Frequency 2x / week   PT Duration 4 weeks   PT Treatment/Interventions Gait training;Neuromuscular re-education;Aquatic Therapy;Functional mobility  training;Patient/family education;Cryotherapy;Therapeutic exercise;Manual techniques;Balance training;Stair training;ADLs/Self Care Home Management;Moist Heat   PT Next Visit Plan Progress LE/hip strengthening/ issue pool ex.   PT Home Exercise Plan Patient provided with stretching routine for her new onset hamstring discomfort.    Consulted and Agree with Plan of Care Patient        Problem List Patient Active Problem List   Diagnosis Date Noted  . Borderline personality disorder 02/10/2015  . Asthma, chronic 02/10/2015  . HTN (hypertension) 02/10/2015  . GERD (gastroesophageal reflux disease) 02/10/2015  . Chronic pain syndrome 02/10/2015  . Arthritis 02/10/2015  . Bipolar II  disorder, most recent episode major depressive with melancholic features 68/02/8109  . Hx of colonic polyps     Kerman Passey, PT, DPT    02/22/2015, 3:44 PM  [facility] [facility] [address] [place], [place], [address] Phone: [phone]   Fax:  [phone]

## 2015-02-25 ENCOUNTER — Encounter: Payer: Medicare Other | Admitting: Physical Therapy

## 2015-02-25 ENCOUNTER — Ambulatory Visit: Payer: Medicare Other | Admitting: Physical Therapy

## 2015-02-25 DIAGNOSIS — M6281 Muscle weakness (generalized): Secondary | ICD-10-CM

## 2015-02-25 DIAGNOSIS — R262 Difficulty in walking, not elsewhere classified: Secondary | ICD-10-CM | POA: Diagnosis not present

## 2015-02-25 DIAGNOSIS — M79672 Pain in left foot: Secondary | ICD-10-CM

## 2015-02-25 DIAGNOSIS — M79671 Pain in right foot: Secondary | ICD-10-CM

## 2015-02-25 NOTE — Therapy (Signed)
[facility] [address] [place], [place], [address] Phone: [phone]   Fax:  [phone]  Physical Therapy Treatment  Patient Details  Name: Brandi Bell MRN: [id number] Date of Birth: [birth year] Referring Provider:  Milus Mallick, MD  Encounter Date: 02/25/2015    Past Medical History  Diagnosis Date  . Depression   . Suicide attempt   . Microhematuria   . History of migraine headaches   . GERD (gastroesophageal reflux disease)   . Rhinitis   . Bipolar disorder   . PTSD (post-traumatic stress disorder)   . Anxiety   . Arthritis     Feet, Hands  . Spinal stenosis of cervical region   . Bulging lumbar disc   . Shortness of breath dyspnea   . Elevated LFTs     having ultrasound [facility] - 12/14/14  . Restless leg syndrome   . Bipolar disorder   . Asthma     [facility] admission - on Bipap - 7/13 - report on paper chart  . PONV (postoperative nausea and vomiting)   . Motion sickness   . Hypertension     Echo -1/16 - report on paper chart    Past Surgical History  Procedure Laterality Date  . Vaginal hysterectomy    . Tubaligation    . Cholecystectomy    . Foot surgery      multiple  . Septoplasty    . Anterior and posterior repair    . Bladder tact    . Shoulder arthroscopy    . Tonsillectomy    . Cardiac catheterization  2008    [facility] - neg - report in paper chart  . Colonoscopy N/A 01/04/2015    Procedure: COLONOSCOPY;  Surgeon: Lucilla Lame, MD;  Location: [place];  Service: Gastroenterology;  Laterality: N/A;  with biopsies    There were no vitals filed for this visit.  Visit Diagnosis:  No diagnosis found.           OBJECTIVE:  Manual: Hamstring and gastroc stretching (90-90 stretching with PN contract relax reducing her symptoms), plantar fascia (tenderness with all palpation) (Provided manual A-P grade II-III mobilizations to the talus to increase DF), grade III mobilizations of RLE  great toe into extension (well tolerated)  There ex:  Scifit L6 10 min. B UE/LE (no charge/ warm-up).     gait (pt complained of mild pain with walking), heel raises 2 sets x 15 (pt tolerated well no increased c/o pain), instructed patient in self stretching her hamstring in 90-90 position with activation of quadricep. Patient educated on use of towel stretching for her calf. Her chief complaint of "arthritis" of her metatarsals is consistent with decreased DF ROM and decreased great toe extension placing increased mobility demand on the metatarsals, which are designed as a rigid stable joint.                           PT Long Term Goals - 02/15/15 1046    PT LONG TERM GOAL #1   Title Pt. I with HEP to increase B LE/ankle strength to 5/5 MMT to improve pain-free walking/ stair climbing.     Time 4   Period Weeks   Status New   PT LONG TERM GOAL #2   Title Pt. able to complete 30 minutes of ther.ex. in pool or land with no increase c/o B foot pain to improve mobility.    Baseline  limited by R foot pain/ muscle cramping.    Time 4   Period Weeks   Status New   PT LONG TERM GOAL #3   Title Pt. will report no tenderness with palpation to top of foot/ base of metatarsals to improve pain-free mobility.     Time 4   Period Weeks   Status New   PT LONG TERM GOAL #4   Title Pt. will report no episodes of R ankle turning/falls consistently for 1 week to improve safety with walking.   Baseline participating with pool ex.   Time 4   Period Weeks   Status New   PT LONG TERM GOAL #5   Title Pt. will increase LEFS to >40 out of 80 to improve walking/ pain-free mobility.     Baseline LEFS: 25 out of 80 (02/14/15)   Time 4   Period Weeks   Status New               Problem List Patient Active Problem List   Diagnosis Date Noted  . Borderline personality disorder 02/10/2015  . Asthma, chronic 02/10/2015  . HTN (hypertension) 02/10/2015  . GERD (gastroesophageal  reflux disease) 02/10/2015  . Chronic pain syndrome 02/10/2015  . Arthritis 02/10/2015  . Bipolar II disorder, most recent episode major depressive with melancholic features 12/75/1700  . Hx of colonic polyps     Burke Ke[facility] 02/25/2015, 8:25 AM   [facility] [address] [place], [place], [address] Phone: [phone]   Fax:  [phone]

## 2015-02-25 NOTE — Therapy (Signed)
[place] [facility] [address] [place], [place], [address] Phone: [phone]   Fax:  [phone]  Physical Therapy Treatment  Patient Details  Name: Brandi Bell MRN: [id number] Date of Birth: 10-11-1960 Referring Provider:  Milus Mallick, MD  Encounter Date: 02/25/2015      PT End of Session - 02/25/15 1337    Visit Number 4   Number of Visits 8   Date for PT Re-Evaluation 03/14/15   Authorization - Visit Number 4   Authorization - Number of Visits 10   PT Start Time 0822   PT Stop Time 0910   PT Time Calculation (min) 48 min   Activity Tolerance Patient tolerated treatment well   Behavior During Therapy [facility] for tasks assessed/performed      Past Medical History  Diagnosis Date  . Depression   . Suicide attempt   . Microhematuria   . History of migraine headaches   . GERD (gastroesophageal reflux disease)   . Rhinitis   . Bipolar disorder   . PTSD (post-traumatic stress disorder)   . Anxiety   . Arthritis     Feet, Hands  . Spinal stenosis of cervical region   . Bulging lumbar disc   . Shortness of breath dyspnea   . Elevated LFTs     having ultrasound [facility] - 12/14/14  . Restless leg syndrome   . Bipolar disorder   . Asthma     [facility] admission - on Bipap - 7/13 - report on paper chart  . PONV (postoperative nausea and vomiting)   . Motion sickness   . Hypertension     Echo -1/16 - report on paper chart    Past Surgical History  Procedure Laterality Date  . Vaginal hysterectomy    . Tubaligation    . Cholecystectomy    . Foot surgery      multiple  . Septoplasty    . Anterior and posterior repair    . Bladder tact    . Shoulder arthroscopy    . Tonsillectomy    . Cardiac catheterization  2008    [facility] - neg - report in paper chart  . Colonoscopy N/A 01/04/2015    Procedure: COLONOSCOPY;  Surgeon: Lucilla Lame, MD;  Location: [place];  Service: Gastroenterology;  Laterality: N/A;  with  biopsies    There were no vitals filed for this visit.  Visit Diagnosis:  Difficulty in walking  Right foot pain  Left foot pain  Muscle weakness      Subjective Assessment - 02/25/15 0826    Subjective Pt reports no pain or cramping over weekend and doing okay this morning.  Pt. states she had increase foot pain last Thursday night and used ice with benefit.  No foot pain at this time.  Pt. states she is doing to see MD today to discuss meds/ poor appetite.  Pt. may start taking an appetite stimulant.     Limitations Sitting;Standing;Walking   Patient Stated Goals decrease pain/ increase mobility   Currently in Pain? No/denies      OBJECTIVE: There ex: SciFit L6 10 mins F/B (warm-up, no charge). Tandem walking in the // bars forwards and backwards. R/L ankle isometrics 5x all 4-planes with static holds (moderate resistance).  Tandem static stance/ walking in //-bars with min. to no UE assist.  Blue Airex: Wt. Shifting/ marching/ SLS >20 sec. Gait for increased heel strike and toe off. Pt educated on contrast bath and cause  of muscle cramps. Manual: isometrics: DF/PF/inversion/eversion x 10 each with 10 second holds.  B LE generalized stretches (gastroc/hip stretching focus).    Pt response for medical necessity: 1 episode of R hip cramping during R ankle isometrics.  No increase c/o pain and improved gait after tx. Session.  Continue to address ankle stability.  Discussed ASO bracing (financial issues).          PT Long Term Goals - 02/15/15 1046    PT LONG TERM GOAL #1   Title Pt. I with HEP to increase B LE/ankle strength to 5/5 MMT to improve pain-free walking/ stair climbing.     Time 4   Period Weeks   Status New   PT LONG TERM GOAL #2   Title Pt. able to complete 30 minutes of ther.ex. in pool or land with no increase c/o B foot pain to improve mobility.    Baseline limited by R foot pain/ muscle cramping.    Time 4   Period Weeks   Status New   PT LONG  TERM GOAL #3   Title Pt. will report no tenderness with palpation to top of foot/ base of metatarsals to improve pain-free mobility.     Time 4   Period Weeks   Status New   PT LONG TERM GOAL #4   Title Pt. will report no episodes of R ankle turning/falls consistently for 1 week to improve safety with walking.   Baseline participating with pool ex.   Time 4   Period Weeks   Status New   PT LONG TERM GOAL #5   Title Pt. will increase LEFS to >40 out of 80 to improve walking/ pain-free mobility.     Baseline LEFS: 25 out of 80 (02/14/15)   Time 4   Period Weeks   Status New            Plan - 02/25/15 1337    Clinical Impression Statement Progressing well with ankle stability ex. program with closed chain activities in //-bars (Airex/ tandem stance).  Pt. fearful of ankle supination/IV stretches and during higher level ankle stability ex. with Airex.  1 episode of R hip muscle cramping during ankle isometrics.     Pt will benefit from skilled therapeutic intervention in order to improve on the following deficits Abnormal gait;Decreased endurance;Decreased strength;Difficulty walking;Decreased mobility;Decreased balance;Pain;Decreased range of motion;Impaired flexibility;Hypermobility;Obesity;Decreased activity tolerance   Rehab Potential Good   PT Frequency 2x / week   PT Duration 4 weeks   PT Treatment/Interventions Gait training;Neuromuscular re-education;Aquatic Therapy;Functional mobility training;Patient/family education;Cryotherapy;Therapeutic exercise;Manual techniques;Balance training;Stair training;ADLs/Self Care Home Management;Moist Heat   PT Next Visit Plan Progress LE/hip strengthening/ issue pool ex.   PT Home Exercise Plan Patient provided with stretching routine for her new onset hamstring discomfort.    Consulted and Agree with Plan of Care Patient        Problem List Patient Active Problem List   Diagnosis Date Noted  . Borderline personality disorder  02/10/2015  . Asthma, chronic 02/10/2015  . HTN (hypertension) 02/10/2015  . GERD (gastroesophageal reflux disease) 02/10/2015  . Chronic pain syndrome 02/10/2015  . Arthritis 02/10/2015  . Bipolar II disorder, most recent episode major depressive with melancholic features 29/92/4268  . Hx of colonic polyps    Pura Spice, PT, DPT # [phone]   02/25/2015, 1:44 PM  [facility] [address] [place], [place], [address] Phone: [phone]   Fax:  [phone]

## 2015-02-27 ENCOUNTER — Ambulatory Visit: Payer: Medicare Other | Admitting: Physical Therapy

## 2015-02-28 ENCOUNTER — Ambulatory Visit: Payer: Medicare Other | Admitting: Physical Therapy

## 2015-02-28 ENCOUNTER — Encounter: Payer: Self-pay | Admitting: Physical Therapy

## 2015-02-28 DIAGNOSIS — M79672 Pain in left foot: Secondary | ICD-10-CM

## 2015-02-28 DIAGNOSIS — R262 Difficulty in walking, not elsewhere classified: Secondary | ICD-10-CM | POA: Diagnosis not present

## 2015-02-28 DIAGNOSIS — M79671 Pain in right foot: Secondary | ICD-10-CM

## 2015-02-28 DIAGNOSIS — M6281 Muscle weakness (generalized): Secondary | ICD-10-CM

## 2015-02-28 NOTE — Therapy (Signed)
[facility] [address] [place], [place], [address] Phone: [phone]   Fax:  [phone]  Physical Therapy Treatment  Patient Details  Name: Brandi Bell MRN: [id number] Date of Birth: 12/27/1960 Referring Provider:  Milus Mallick, MD  Encounter Date: 02/28/2015      PT End of Session - 02/28/15 1353    Visit Number 5   Number of Visits 8   Date for PT Re-Evaluation 03/14/15   Authorization - Visit Number 5   Authorization - Number of Visits 10   PT Start Time 1013   PT Stop Time 5681   PT Time Calculation (min) 50 min   Activity Tolerance Patient tolerated treatment well;No increased pain   Behavior During Therapy [facility] for tasks assessed/performed      Past Medical History  Diagnosis Date  . Depression   . Suicide attempt   . Microhematuria   . History of migraine headaches   . GERD (gastroesophageal reflux disease)   . Rhinitis   . Bipolar disorder   . PTSD (post-traumatic stress disorder)   . Anxiety   . Arthritis     Feet, Hands  . Spinal stenosis of cervical region   . Bulging lumbar disc   . Shortness of breath dyspnea   . Elevated LFTs     having ultrasound [facility] - 12/14/14  . Restless leg syndrome   . Bipolar disorder   . Asthma     [facility] admission - on Bipap - 7/13 - report on paper chart  . PONV (postoperative nausea and vomiting)   . Motion sickness   . Hypertension     Echo -1/16 - report on paper chart    Past Surgical History  Procedure Laterality Date  . Vaginal hysterectomy    . Tubaligation    . Cholecystectomy    . Foot surgery      multiple  . Septoplasty    . Anterior and posterior repair    . Bladder tact    . Shoulder arthroscopy    . Tonsillectomy    . Cardiac catheterization  2008    [facility] - neg - report in paper chart  . Colonoscopy N/A 01/04/2015    Procedure: COLONOSCOPY;  Surgeon: Lucilla Lame, MD;  Location: [place];  Service: Gastroenterology;   Laterality: N/A;  with biopsies    There were no vitals filed for this visit.  Visit Diagnosis:  Difficulty in walking  Right foot pain  Muscle weakness  Left foot pain      Subjective Assessment - 02/28/15 1351    Subjective Pt reports no foot pain today but complains of R knee soreness with standing balance tasks.    Limitations Standing;Walking;House hold activities;Lifting   Patient Stated Goals decrease pain/increase mobility   Currently in Pain? Yes   Pain Score 4    Pain Location Knee   Pain Orientation Right   Pain Type Chronic pain   Pain Onset More than a month ago   Pain Frequency Intermittent      OBJECTIVE: There ex: Scifit L7 10 minutes F/B (warm-up, no charge). In // bars, toe walking/heel walking/marching/tandem walking F/B x 4 each. Neuro re-ed: In // bars, on Airex: normal BOS EO/normal BOS EC/adducted stance/semi-tandem x 1 min each. In // bars on blue Airex: toe raises/ heel raises 10 x 2. On airex with 2.5# ball and rebounder: normal stance/semi tandem/adducted x 20 each. Manual: B gastroc stretching, isometrics with manual moderate  resistance for inversion/eversion with 10 second hold x 5 bilaterally.   Pt response to Tx for medical necessity: Pt ankles progressing with stability. Pt able to perform balance challenges without UE support and have appropriate responses on airex surface.         PT Long Term Goals - 02/15/15 1046    PT LONG TERM GOAL #1   Title Pt. I with HEP to increase B LE/ankle strength to 5/5 MMT to improve pain-free walking/ stair climbing.     Time 4   Period Weeks   Status New   PT LONG TERM GOAL #2   Title Pt. able to complete 30 minutes of ther.ex. in pool or land with no increase c/o B foot pain to improve mobility.    Baseline limited by R foot pain/ muscle cramping.    Time 4   Period Weeks   Status New   PT LONG TERM GOAL #3   Title Pt. will report no tenderness with palpation to top of foot/ base of metatarsals to  improve pain-free mobility.     Time 4   Period Weeks   Status New   PT LONG TERM GOAL #4   Title Pt. will report no episodes of R ankle turning/falls consistently for 1 week to improve safety with walking.   Baseline participating with pool ex.   Time 4   Period Weeks   Status New   PT LONG TERM GOAL #5   Title Pt. will increase LEFS to >40 out of 80 to improve walking/ pain-free mobility.     Baseline LEFS: 25 out of 80 (02/14/15)   Time 4   Period Weeks   Status New           Plan - 02/28/15 1354    Clinical Impression Statement Pt progressing with appropriate ankle strategies in reponse to balance on airex pad. Pt able to transfer activities on firm ground to foam today without c/o increased pain. Pt demonstrate good carryover with balance on airex outside of the // bars. Pt limited in endurance by R knee pain for today's tx session. Pt continues to be fearful of ankle instability with gait but was able to perform isometrics without increased complaints of pain or cramping.    Pt will benefit from skilled therapeutic intervention in order to improve on the following deficits Abnormal gait;Decreased balance;Decreased strength;Hypermobility;Decreased coordination;Difficulty walking;Impaired flexibility;Improper body mechanics;Pain   Rehab Potential Good   PT Frequency 2x / week   PT Duration 4 weeks   PT Treatment/Interventions ADLs/Self Care Home Management;Cryotherapy;Moist Heat;Balance training;Therapeutic exercise;Manual techniques;Therapeutic activities;Functional mobility training;Gait training;Stair training;Patient/family education;Neuromuscular re-education;Passive range of motion   PT Next Visit Plan continue to progress balance challenges to SLS/strengthening/stability of invertors/evertor musculature    PT Home Exercise Plan continue with strengthening and stretching exercises--re-address next session   Recommended Other Services walking program    Consulted and Agree  with Plan of Care Patient        Problem List Patient Active Problem List   Diagnosis Date Noted  . Borderline personality disorder 02/10/2015  . Asthma, chronic 02/10/2015  . HTN (hypertension) 02/10/2015  . GERD (gastroesophageal reflux disease) 02/10/2015  . Chronic pain syndrome 02/10/2015  . Arthritis 02/10/2015  . Bipolar II disorder, most recent episode major depressive with melancholic features 16/01/3709  . Hx of colonic polyps     Lavone Neri, SPT  03/01/2015, 10:41 AM  [facility] [address]  [place], [place], [address] Phone: [phone]   Fax:  [phone]

## 2015-03-04 ENCOUNTER — Encounter: Payer: Self-pay | Admitting: Physical Therapy

## 2015-03-04 ENCOUNTER — Ambulatory Visit: Payer: Medicare Other | Admitting: Physical Therapy

## 2015-03-04 DIAGNOSIS — R262 Difficulty in walking, not elsewhere classified: Secondary | ICD-10-CM

## 2015-03-04 DIAGNOSIS — M6281 Muscle weakness (generalized): Secondary | ICD-10-CM

## 2015-03-04 DIAGNOSIS — M79672 Pain in left foot: Secondary | ICD-10-CM

## 2015-03-04 DIAGNOSIS — M79671 Pain in right foot: Secondary | ICD-10-CM

## 2015-03-04 DIAGNOSIS — M25561 Pain in right knee: Secondary | ICD-10-CM

## 2015-03-04 NOTE — Therapy (Signed)
[place] [facility] [facility] [address] [place], [place], [address] Phone: [phone]   Fax:  [phone]  Physical Therapy Treatment  Patient Details  Name: Brandi Bell MRN: [id number] Date of Birth: 1961-04-05 Referring Provider:  Milus Mallick, MD  Encounter Date: 03/04/2015      PT End of Session - 03/04/15 1736    Visit Number 6   Number of Visits 8   Date for PT Re-Evaluation 03/14/15   Authorization - Visit Number 6   Authorization - Number of Visits 10   PT Start Time 4132   PT Stop Time 4401   PT Time Calculation (min) 55 min   Activity Tolerance Patient tolerated treatment well;No increased pain   Behavior During Therapy [facility] for tasks assessed/performed      Past Medical History  Diagnosis Date  . Depression   . Suicide attempt   . Microhematuria   . History of migraine headaches   . GERD (gastroesophageal reflux disease)   . Rhinitis   . Bipolar disorder   . PTSD (post-traumatic stress disorder)   . Anxiety   . Arthritis     Feet, Hands  . Spinal stenosis of cervical region   . Bulging lumbar disc   . Shortness of breath dyspnea   . Elevated LFTs     having ultrasound [facility] - 12/14/14  . Restless leg syndrome   . Bipolar disorder   . Asthma     [facility] admission - on Bipap - 7/13 - report on paper chart  . PONV (postoperative nausea and vomiting)   . Motion sickness   . Hypertension     Echo -1/16 - report on paper chart    Past Surgical History  Procedure Laterality Date  . Vaginal hysterectomy    . Tubaligation    . Cholecystectomy    . Foot surgery      multiple  . Septoplasty    . Anterior and posterior repair    . Bladder tact    . Shoulder arthroscopy    . Tonsillectomy    . Cardiac catheterization  2008    [facility] - neg - report in paper chart  . Colonoscopy N/A 01/04/2015    Procedure: COLONOSCOPY;  Surgeon: Lucilla Lame, MD;  Location: [place];  Service: Gastroenterology;   Laterality: N/A;  with biopsies    There were no vitals filed for this visit.  Visit Diagnosis:  Difficulty in walking  Right foot pain  Muscle weakness  Left foot pain  Pain in right knee      Subjective Assessment - 03/04/15 1735    Subjective Pt reports no foot pain today but complains of R knee pain. Pt states that she was able to go out walking over the weekend and perform some of her HEP.    Limitations Walking;Standing;House hold activities;Lifting   Patient Stated Goals decrease pain/increase stability    Currently in Pain? No/denies         OBJECTIVE:  LEFS: 38/80 There ex: Scifit L7 10 mins F/B (warm-up, no charge). Tandem walking forwards and backwards x 4. Step ups and weight shifts on the BOSU 10 x 2 each.  Airex heel and toe raises x 20 each. SLS on airex 20 seconds x 3 each leg.  Manual: B LE stretching (focusing on R gastroc).  Ankle mod. Isometrics (all planes).   Pt response for medical necessity: Pt continues to progress with balance challenges to demonstrate an increase in  ankle stability.             PT Long Term Goals - 02/15/15 1046    PT LONG TERM GOAL #1   Title Pt. I with HEP to increase B LE/ankle strength to 5/5 MMT to improve pain-free walking/ stair climbing.     Time 4   Period Weeks   Status New   PT LONG TERM GOAL #2   Title Pt. able to complete 30 minutes of ther.ex. in pool or land with no increase c/o B foot pain to improve mobility.    Baseline limited by R foot pain/ muscle cramping.    Time 4   Period Weeks   Status New   PT LONG TERM GOAL #3   Title Pt. will report no tenderness with palpation to top of foot/ base of metatarsals to improve pain-free mobility.     Time 4   Period Weeks   Status New   PT LONG TERM GOAL #4   Title Pt. will report no episodes of R ankle turning/falls consistently for 1 week to improve safety with walking.   Baseline participating with pool ex.   Time 4   Period Weeks   Status New   PT  LONG TERM GOAL #5   Title Pt. will increase LEFS to >40 out of 80 to improve walking/ pain-free mobility.     Baseline LEFS: 25 out of 80 (02/14/15)   Time 4   Period Weeks   Status New           Plan - 03/04/15 1737    Clinical Impression Statement Pt able to use appropriate ankle and hip strategies with all balance challenges today. Pt maintains SLS on airex for 20 seconds with opposite knee in front. Pt continues to be limited by cramping with SLS and tandem walking. Pt progressing with ankle stability well as demonstrated by BOSU step ups and appropiate weight shifts today.  LEFS: 38/80   Pt will benefit from skilled therapeutic intervention in order to improve on the following deficits Abnormal gait;Decreased activity tolerance;Decreased balance;Hypermobility;Decreased endurance;Difficulty walking;Improper body mechanics;Decreased strength   Rehab Potential Good   PT Frequency 2x / week   PT Duration 4 weeks   PT Treatment/Interventions ADLs/Self Care Home Management;Cryotherapy;Moist Heat;Balance training;Therapeutic exercise;Manual techniques;Therapeutic activities;Functional mobility training;Stair training;Gait training;Patient/family education;Neuromuscular re-education   PT Next Visit Plan progress SLS and tandem balance activities.    PT Home Exercise Plan Continue with current HEP--progress next session.    Recommended Other Services walking and pool exercise   Consulted and Agree with Plan of Care Patient        Problem List Patient Active Problem List   Diagnosis Date Noted  . Borderline personality disorder 02/10/2015  . Asthma, chronic 02/10/2015  . HTN (hypertension) 02/10/2015  . GERD (gastroesophageal reflux disease) 02/10/2015  . Chronic pain syndrome 02/10/2015  . Arthritis 02/10/2015  . Bipolar II disorder, most recent episode major depressive with melancholic features 33/00/7622  . Hx of colonic polyps     Lavone Neri, SPT  03/05/2015, 7:48  AM  [facility] [address] [place], [place], [address] Phone: [phone]   Fax:  [phone]

## 2015-03-07 ENCOUNTER — Ambulatory Visit: Payer: Medicare Other | Admitting: Physical Therapy

## 2015-03-07 ENCOUNTER — Encounter: Payer: Medicare Other | Admitting: Physical Therapy

## 2015-03-14 ENCOUNTER — Ambulatory Visit: Payer: Medicare Other | Admitting: Physical Therapy

## 2015-05-01 IMAGING — CR DG ANKLE COMPLETE 3+V*L*
1 series · 5 of 5 positions shown · non-contrast
Comparison: none

REASON FOR EXAM: left ankle pain, fall, swelling
COMMENTS:

PROCEDURE:     DXR - DXR ANKLE LEFT COMPLETE  - February 04, 2013  [DATE]
RESULT:

[Series 1: ap · 0.17mm/px · 5 of 5 slices shown]
[im 1/5]
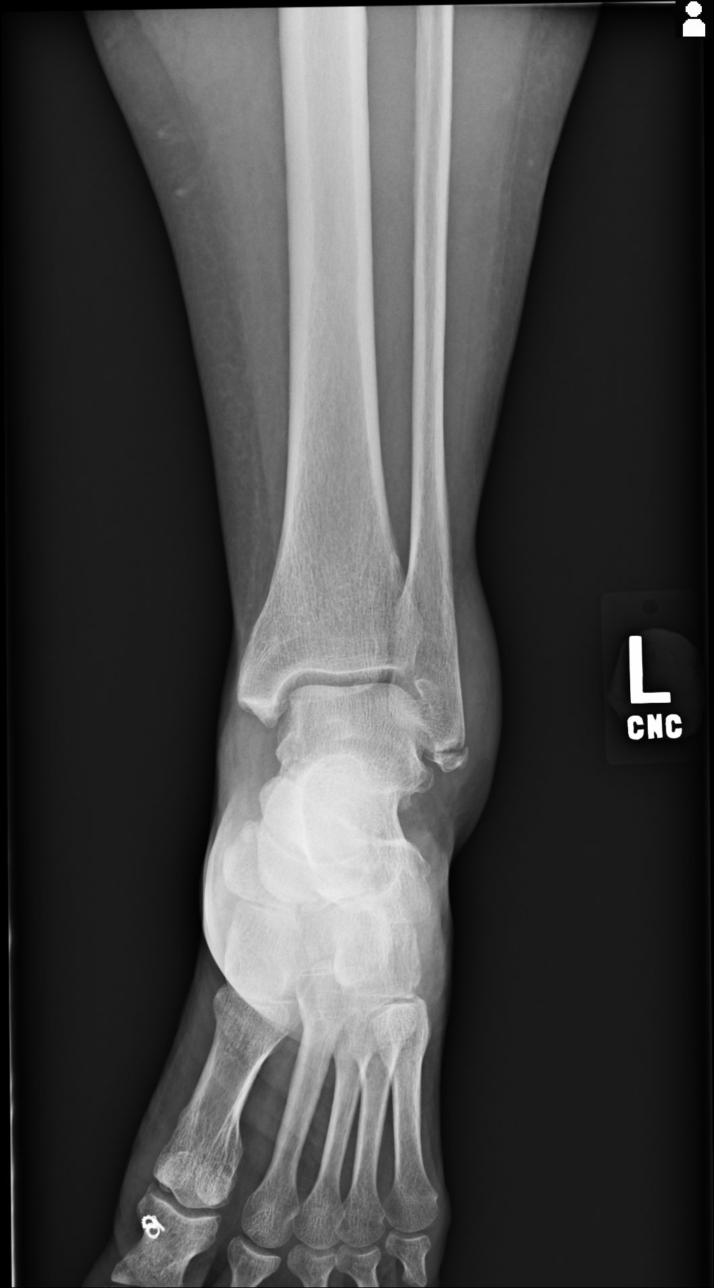
[im 2/5]
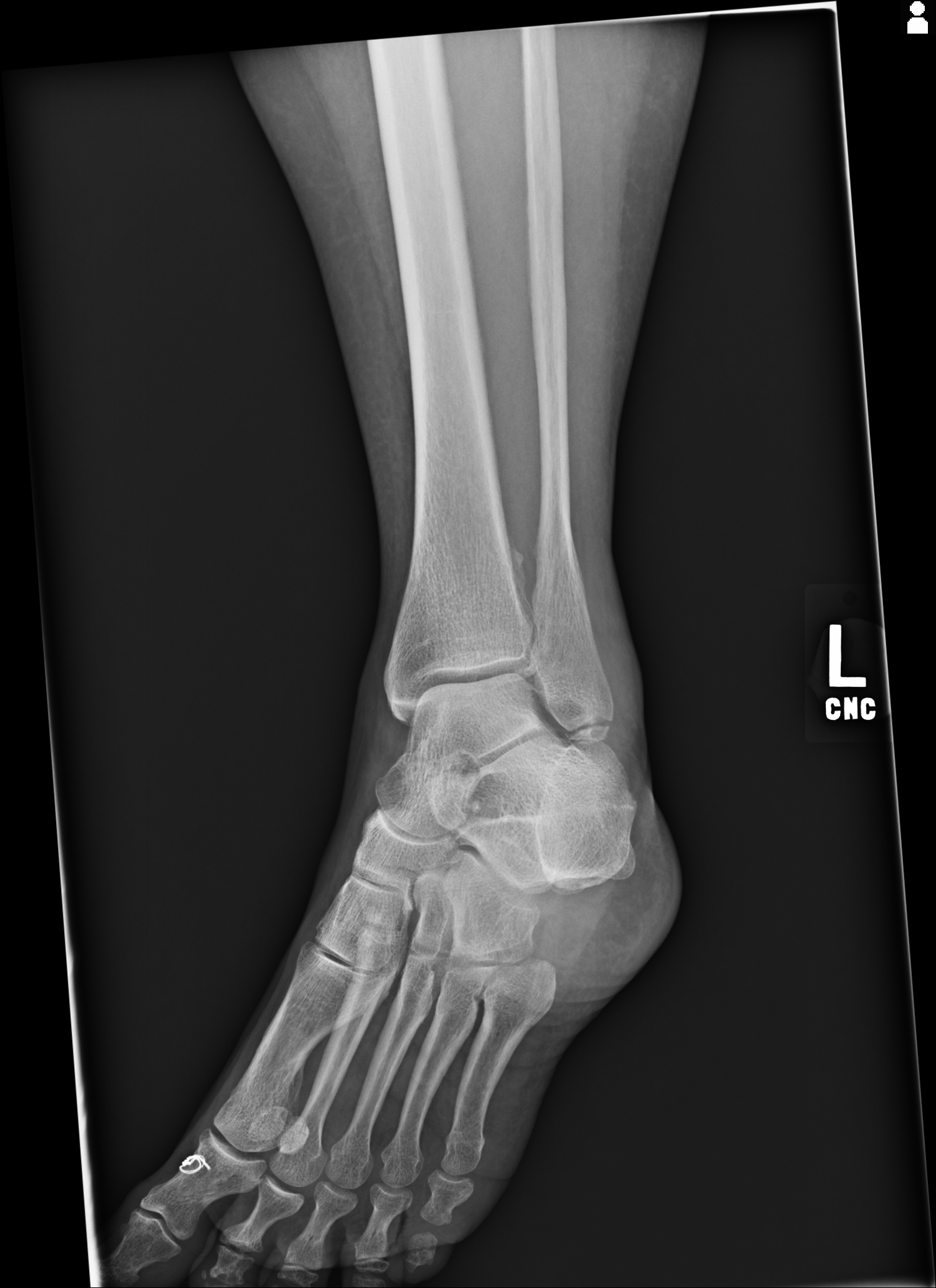
[im 3/5]
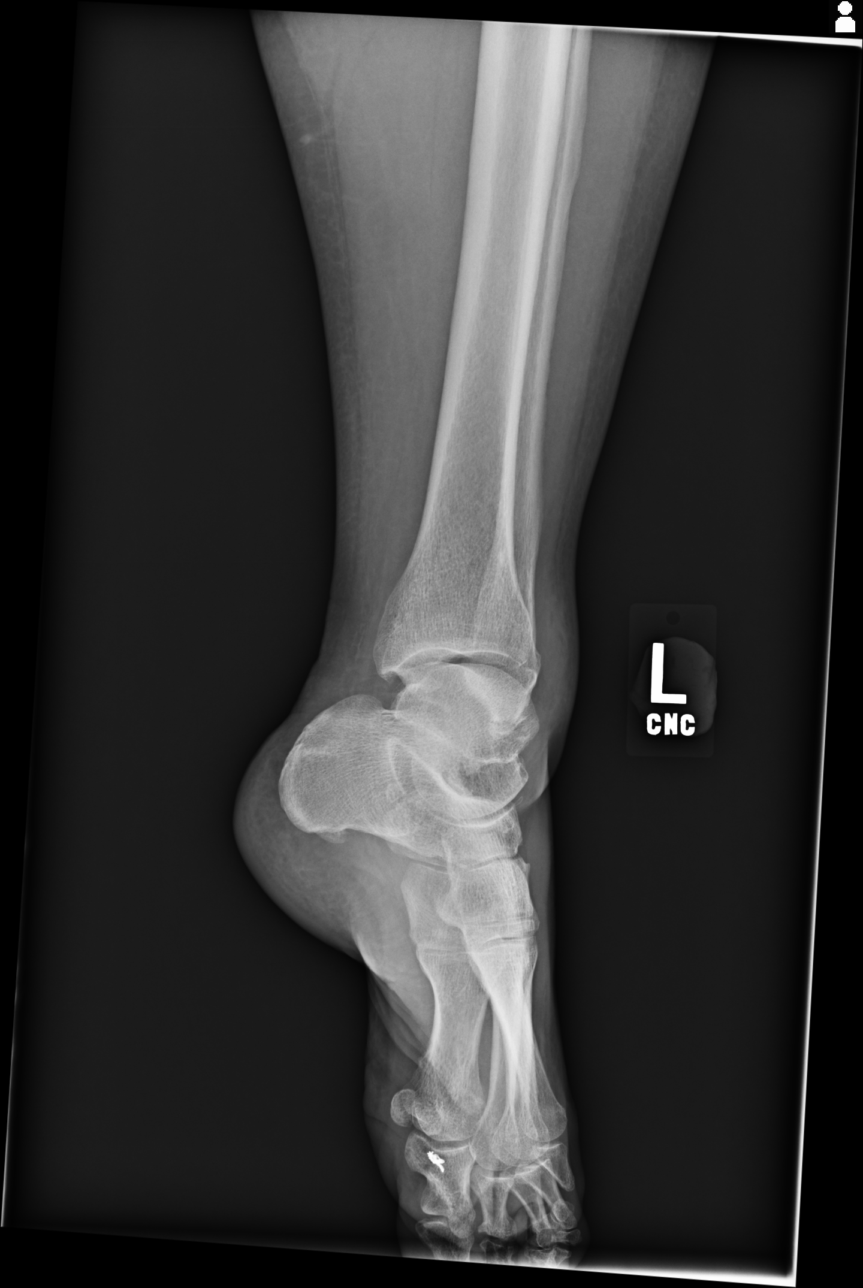
[im 4/5]
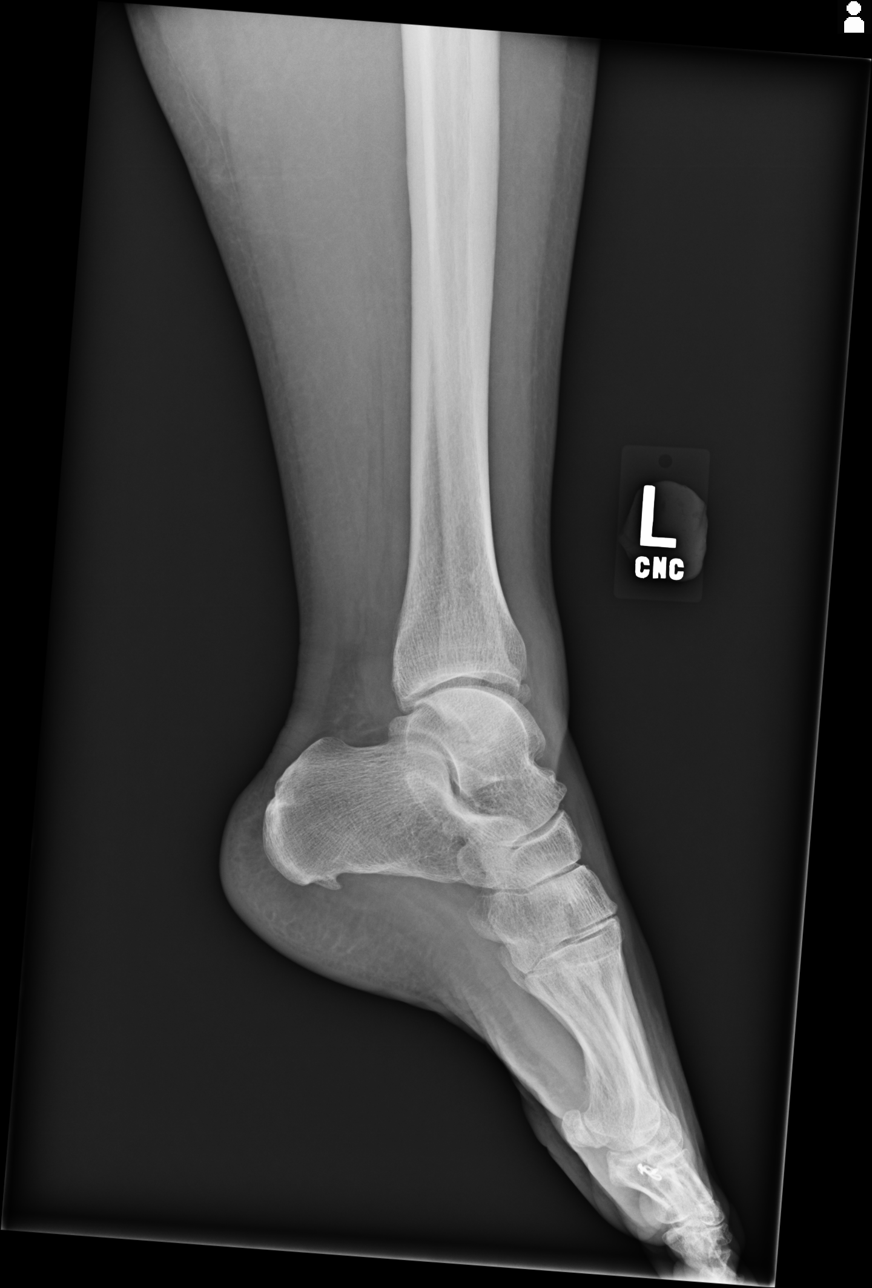
[im 5/5]
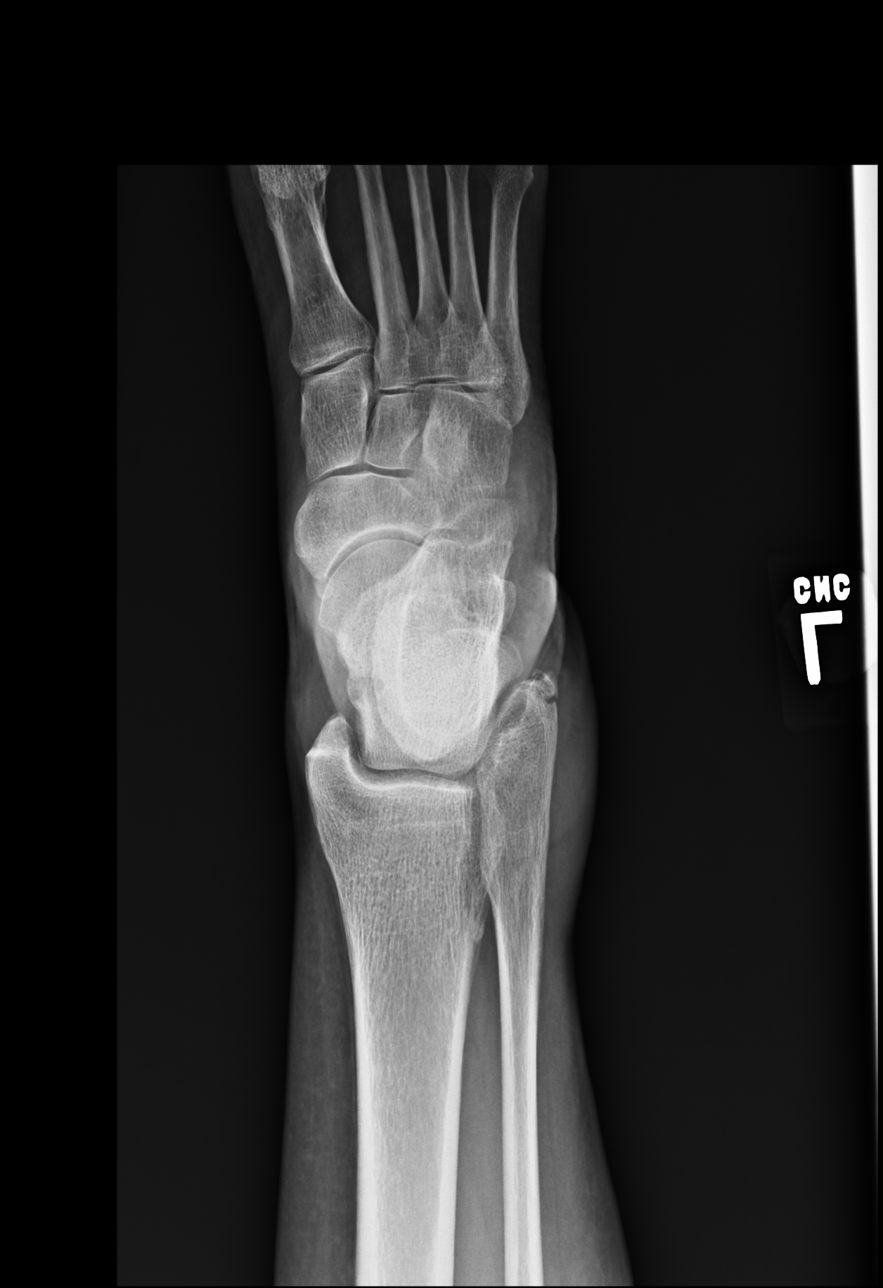

[5 of 5 positions shown; findings below may reference images not displayed]

FINDINGS: Avulsion fracture is identified along the distal aspect of the
lateral malleolus. Soft tissue swelling is appreciated within the lateral
malleolar region.
IMPRESSION: Distal lateral malleolar fracture.

## 2015-05-24 ENCOUNTER — Encounter: Payer: Self-pay | Admitting: *Deleted

## 2015-05-24 ENCOUNTER — Emergency Department: Payer: Medicare Other

## 2015-05-24 ENCOUNTER — Encounter: Payer: Self-pay | Admitting: Emergency Medicine

## 2015-05-24 ENCOUNTER — Inpatient Hospital Stay
Admission: EM | Admit: 2015-05-24 | Discharge: 2015-05-26 | DRG: 208 | Disposition: A | Discharge status: Home / Self Care | Payer: Medicare Other | Attending: Internal Medicine | Admitting: Internal Medicine

## 2015-05-24 ENCOUNTER — Ambulatory Visit (INDEPENDENT_AMBULATORY_CARE_PROVIDER_SITE_OTHER)
Admission: EM | Admit: 2015-05-24 | Discharge: 2015-05-24 | Disposition: A | Discharge status: Home / Self Care | Payer: Medicare Other | Source: Home / Self Care

## 2015-05-24 DIAGNOSIS — J45902 Unspecified asthma with status asthmaticus: Secondary | ICD-10-CM | POA: Diagnosis not present

## 2015-05-24 DIAGNOSIS — Z881 Allergy status to other antibiotic agents status: Secondary | ICD-10-CM

## 2015-05-24 DIAGNOSIS — K219 Gastro-esophageal reflux disease without esophagitis: Secondary | ICD-10-CM | POA: Diagnosis present

## 2015-05-24 DIAGNOSIS — E871 Hypo-osmolality and hyponatremia: Secondary | ICD-10-CM | POA: Diagnosis present

## 2015-05-24 DIAGNOSIS — Z833 Family history of diabetes mellitus: Secondary | ICD-10-CM

## 2015-05-24 DIAGNOSIS — J45901 Unspecified asthma with (acute) exacerbation: Secondary | ICD-10-CM | POA: Diagnosis not present

## 2015-05-24 DIAGNOSIS — Z809 Family history of malignant neoplasm, unspecified: Secondary | ICD-10-CM

## 2015-05-24 DIAGNOSIS — F319 Bipolar disorder, unspecified: Secondary | ICD-10-CM | POA: Diagnosis present

## 2015-05-24 DIAGNOSIS — Z825 Family history of asthma and other chronic lower respiratory diseases: Secondary | ICD-10-CM

## 2015-05-24 DIAGNOSIS — I10 Essential (primary) hypertension: Secondary | ICD-10-CM | POA: Diagnosis present

## 2015-05-24 DIAGNOSIS — F4310 Post-traumatic stress disorder, unspecified: Secondary | ICD-10-CM | POA: Diagnosis present

## 2015-05-24 DIAGNOSIS — M1990 Unspecified osteoarthritis, unspecified site: Secondary | ICD-10-CM | POA: Diagnosis present

## 2015-05-24 DIAGNOSIS — J9600 Acute respiratory failure, unspecified whether with hypoxia or hypercapnia: Secondary | ICD-10-CM | POA: Diagnosis present

## 2015-05-24 DIAGNOSIS — J310 Chronic rhinitis: Secondary | ICD-10-CM | POA: Diagnosis present

## 2015-05-24 DIAGNOSIS — G2581 Restless legs syndrome: Secondary | ICD-10-CM | POA: Diagnosis present

## 2015-05-24 DIAGNOSIS — E876 Hypokalemia: Secondary | ICD-10-CM | POA: Diagnosis present

## 2015-05-24 DIAGNOSIS — Z8249 Family history of ischemic heart disease and other diseases of the circulatory system: Secondary | ICD-10-CM

## 2015-05-24 DIAGNOSIS — Z882 Allergy status to sulfonamides status: Secondary | ICD-10-CM

## 2015-05-24 LAB — CBC WITH DIFFERENTIAL/PLATELET
Basophils Absolute: 0 10*3/uL (ref 0–0.1)
Basophils Relative: 0 %
Eosinophils Absolute: 0 10*3/uL (ref 0–0.7)
Eosinophils Relative: 0 %
HEMATOCRIT: 41 % (ref 35.0–47.0)
HEMOGLOBIN: 13.9 g/dL (ref 12.0–16.0)
LYMPHS PCT: 2 %
Lymphs Abs: 0.2 10*3/uL — ABNORMAL LOW (ref 1.0–3.6)
MCH: 30.6 pg (ref 26.0–34.0)
MCHC: 33.9 g/dL (ref 32.0–36.0)
MCV: 90.2 fL (ref 80.0–100.0)
MONO ABS: 0.1 10*3/uL — AB (ref 0.2–0.9)
Monocytes Relative: 1 %
NEUTROS PCT: 97 %
Neutro Abs: 9.9 10*3/uL — ABNORMAL HIGH (ref 1.4–6.5)
Platelets: 252 10*3/uL (ref 150–440)
RBC: 4.54 MIL/uL (ref 3.80–5.20)
RDW: 13.1 % (ref 11.5–14.5)
WBC: 10.3 10*3/uL (ref 3.6–11.0)

## 2015-05-24 MED ORDER — MAGNESIUM SULFATE 2 GM/50ML IV SOLN
2.0000 g | Freq: Once | INTRAVENOUS | Status: AC
Start: 2015-05-24 — End: 2015-05-25
  Administered 2015-05-24: 2 g via INTRAVENOUS
  Filled 2015-05-24: qty 50

## 2015-05-24 MED ORDER — IPRATROPIUM-ALBUTEROL 0.5-2.5 (3) MG/3ML IN SOLN: 3.0000 mL | Freq: Once | RESPIRATORY_TRACT | Status: DC

## 2015-05-24 MED ORDER — IPRATROPIUM-ALBUTEROL 0.5-2.5 (3) MG/3ML IN SOLN
3.0000 mL | Freq: Once | RESPIRATORY_TRACT | Status: AC
  Administered 2015-05-24: 3 mL via RESPIRATORY_TRACT

## 2015-05-24 MED ORDER — PREDNISONE 10 MG PO TABS: 20.0000 mg | ORAL_TABLET | Freq: Every day | ORAL | Status: DC

## 2015-05-24 MED ORDER — METHYLPREDNISOLONE SODIUM SUCC 125 MG IJ SOLR
125.0000 mg | Freq: Once | INTRAMUSCULAR | Status: AC
  Administered 2015-05-24: 125 mg via INTRAMUSCULAR

## 2015-05-24 MED ORDER — IPRATROPIUM-ALBUTEROL 0.5-2.5 (3) MG/3ML IN SOLN
3.0000 mL | Freq: Once | RESPIRATORY_TRACT | Status: AC
  Administered 2015-05-24: 3 mL via RESPIRATORY_TRACT
  Filled 2015-05-24: qty 3

## 2015-05-24 NOTE — ED Notes (Signed)
Pt arrived via EMS from home with duoneb treatment in process; EMS reports pt was seen at urgent care earlier this evening for shortness of breath; she received 2 breathing treatments and solu-medrol; pt says she was feeling better when she left urgent care and felt fine for about 2 hours; pt given 2 duonebs in route to ED (receiving the second one upon arrival) and given another dose of solu-medrol; pt says she has a history of asthma; her symptoms started after walking to the mailbox during the code orange air quality; pt says she had used her nebulizer and her inhaler a couple of times before ever going to urgent care this evening;

## 2015-05-24 NOTE — ED Provider Notes (Signed)
[facility] Emergency Department Provider Note  ____________________________________________  Time seen: Approximately 10:58 PM  I have reviewed the triage vital signs and the nursing notes.   HISTORY  Chief Complaint Shortness of Breath and Cough    HPI Brandi Bell is a 54 y.o. female with a history of asthma requiring 2 prior intubations who presents with shortness of breath and increased work of breathing.  She says that it started about 12 hours ago when she went outside to get her mail.  She has tried using her breathing treatments at home which did not help.  She went to the urgent care earlier today and received Solu-Medrol 125 mg IM as well as 2 breathing treatments.  After that time she was feeling better but within 2 hours she was feeling worse again.  The onset of symptoms has been gradual but persistent and severe in intensity.When she called EMS for this visit, she was given another dose of Solu-Medrol and has received 2 additional DuoNeb's.  She states that her breathing is a little bit better but she is still in moderate respiratory distress, with a frequent productive cough and increased work of breathing including retractions and accessory muscle usage.  She denies chest pain, abdominal pain, nausea/vomiting, fever/chills.   Past Medical History  Diagnosis Date  . Depression   . Suicide attempt ([place])   . Microhematuria   . History of migraine headaches   . GERD (gastroesophageal reflux disease)   . Rhinitis   . Bipolar disorder ([place])   . PTSD (post-traumatic stress disorder)   . Anxiety   . Arthritis     Feet, Hands  . Spinal stenosis of cervical region   . Bulging lumbar disc   . Shortness of breath dyspnea   . Elevated LFTs     having ultrasound [facility] - 12/14/14  . Restless leg syndrome   . Bipolar disorder ([place])   . Asthma     [facility] admission - on Bipap - 7/13 - report on paper chart  . PONV (postoperative nausea and vomiting)    . Motion sickness   . Hypertension     Echo -1/16 - report on paper chart    Patient Active Problem List   Diagnosis Date Noted  . Asthma exacerbation 05/25/2015  . Borderline personality disorder 02/10/2015  . Asthma, chronic 02/10/2015  . HTN (hypertension) 02/10/2015  . GERD (gastroesophageal reflux disease) 02/10/2015  . Chronic pain syndrome 02/10/2015  . Arthritis 02/10/2015  . 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 Hx of colonic polyps     Past Surgical History  Procedure Laterality Date  . Vaginal hysterectomy    . Tubaligation    . Cholecystectomy    . Foot surgery      multiple  . Septoplasty    . Anterior and posterior repair    . Bladder tact    . Shoulder arthroscopy    . Tonsillectomy    . Cardiac catheterization  2008    [facility] - neg - report in paper chart  . Colonoscopy N/A 01/04/2015    Procedure: COLONOSCOPY;  Surgeon: Lucilla Lame, MD;  Location: [place];  Service: Gastroenterology;  Laterality: N/A;  with biopsies    No current outpatient prescriptions on file.  Allergies Ciprofloxacin; Amoxicillin; Morphine and related; Pineapple; Sulfa antibiotics; and Tape  Family History  Problem Relation Age of Onset  . Asthma Mother   . Cancer Mother   .  Diabetes Mother   . Heart attack Father     Social History Social History  Substance Use Topics  . Smoking status: Never Smoker   . Smokeless tobacco: None  . Alcohol Use: No    Review of Systems Constitutional: No fever/chills Eyes: No visual changes. ENT: No sore throat. Cardiovascular: Denies chest pain. Respiratory: Difficulty breathing with frequent productive cough, wheezing, retractions. Gastrointestinal: No abdominal pain.  No nausea, no vomiting.  No diarrhea.  No constipation. Genitourinary: Negative for dysuria. Musculoskeletal: Negative for back pain. Skin: Negative for rash. Neurological: Negative for  headaches, focal weakness or numbness.  10-point ROS otherwise negative.  ____________________________________________   PHYSICAL EXAM:  VITAL SIGNS: ED Triage Vitals  Enc Vitals Group     BP 05/24/15 2244 121/77 mmHg     Pulse Rate 05/24/15 2244 122     Resp 05/24/15 2244 26     Temp 05/24/15 2244 98.2 F (36.8 C)     Temp Source 05/24/15 2244 Oral     SpO2 05/24/15 2244 100 %     Weight 05/24/15 2244 210 lb (95.255 kg)     Height 05/24/15 2244 5\' 1"  (1.549 m)     Head Cir --      Peak Flow --      Pain Score 05/24/15 2245 4     Pain Loc --      Pain Edu? --      Excl. in [place]? --     Constitutional: Alert and oriented.  Anxious with moderate respiratory distress Eyes: Conjunctivae are normal. PERRL. EOMI. Head: Atraumatic. Nose: No congestion/rhinnorhea. Mouth/Throat: Mucous membranes are moist.  Oropharynx non-erythematous. Neck: No stridor.   Cardiovascular: Tachycardia, regular rhythm. Grossly normal heart sounds.  Good peripheral circulation. Respiratory: Increased respiratory effort with frequent thick cough.  Some retractions.  Mild expiratory wheezing throughout Gastrointestinal: Soft and nontender. No distention. No abdominal bruits. No CVA tenderness. Musculoskeletal: No lower extremity tenderness nor edema.  No joint effusions. Neurologic:  Normal speech and language. No gross focal neurologic deficits are appreciated.  Skin:  Skin is warm, dry and intact. No rash noted.   ____________________________________________   LABS (all labs ordered are listed, but only abnormal results are displayed)  Labs Reviewed  CBC WITH DIFFERENTIAL/PLATELET - Abnormal; Notable for the following:    Neutro Abs 9.9 (*)    Lymphs Abs 0.2 (*)    Monocytes Absolute 0.1 (*)    All other components within normal limits  BASIC METABOLIC PANEL - Abnormal; Notable for the following:    Sodium 130 (*)    Potassium 2.9 (*)    CO2 17 (*)    Glucose, Bld 175 (*)    Calcium 7.9  (*)    All other components within normal limits  TROPONIN I   ____________________________________________  EKG  Not indicated ____________________________________________  RADIOLOGY   Dg Chest 2 View  05/24/2015  CLINICAL DATA:  Patient with cough and shortness of breath. EXAM: CHEST  2 VIEW COMPARISON:  Chest radiograph 11/06/2013. FINDINGS: Stable cardiac and mediastinal contours. No consolidative pulmonary opacities. No pleural effusion or pneumothorax. Regional skeleton is unremarkable. IMPRESSION: No acute cardiopulmonary process. Electronically Signed   By: Lovey Newcomer M.D.   On: 05/24/2015 23:15    ____________________________________________   PROCEDURES  Procedure(s) performed: None  Critical Care performed: Yes   CRITICAL CARE Performed by: Hinda Kehr   Total critical care time: 45 minutes  Critical care time was exclusive of separately billable procedures and  treating other patients.  Critical care was necessary to treat or prevent imminent or life-threatening deterioration.  Critical care was time spent personally by me on the following activities: development of treatment plan with patient and/or surrogate as well as nursing, discussions with consultants, evaluation of patient's response to treatment, examination of patient, obtaining history from patient or surrogate, ordering and performing treatments and interventions, ordering and review of laboratory studies, ordering and review of radiographic studies, pulse oximetry and re-evaluation of patient's condition.  ____________________________________________   INITIAL IMPRESSION / ASSESSMENT AND PLAN / ED COURSE  Pertinent labs & imaging results that were available during my care of the patient were reviewed by me and considered in my medical decision making (see chart for details).  11:18 PM The patient remains in moderate respiratory distress in spite of multiple treatments earlier today.  She has  gotten 2 doses of Solu-Medrol 125 mg and a total of at least 4 do a notes over the course of today.  She received to immediately prior to arrival and I will give the third.  I am also going to establish peripheral IV access and give her 2 g of magnesium.  She has had refractory asthma in the past requiring intubation so I will attempt to treat her with maximal therapy including BiPAP - she states she is getting tired, and feels similar to the way she felt in the past when she required intubation.  I have called RT.  ----------------------------------------- 12:07 AM on 05/25/2015 -----------------------------------------  Patient is now breathing comfortably on the BiPAP.  Her bronchospasms have eased off and she feels much better.  She has no wheezing upon auscultation.  She is still on the BiPAP and we will continue for a total of about an hour before he tried to remove the BiPAP and see how she does.  I am concerned that she has received maximal therapy and that she received appropriate therapy earlier today but yet bounce back within 2 hours to the emergency department.  She may need admission for every 2 hours nebs and close observation to make sure she does not have another severe episode while at home.  The patient and her partner agree that we can do a trial off of the BiPAP and see how she does.  ----------------------------------------- 1:08 AM on 05/25/2015 -----------------------------------------  The patient feels much better and is now breathing without the BiPAP.  She is also tolerating by mouth fluids.  (Note that documentation was delayed due to multiple ED patients requiring immediate care.)   After couple of hours off the BiPAP the patient started becoming short of breath again.  Given her severe history including multiple intubations and her refractory asthma tonight, I will admit her for further management and every 2 hours nebulizer  treatments.  ____________________________________________  FINAL CLINICAL IMPRESSION(S) / ED DIAGNOSES  Final diagnoses:  Acute severe exacerbation of asthma  Status asthmaticus    NEW MEDICATIONS STARTED DURING THIS VISIT:  Current Discharge Medication List       Hinda Kehr, MD 05/25/15 [phone]

## 2015-05-24 NOTE — ED Notes (Signed)
Pt states that she has had a cough since lunch time today when she walked outside to get the mail.  Pt states that she has had 1 nebulizer treatment 3 hours ago and 3 puffs of inhaler
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
## 2015-05-24 NOTE — Discharge Instructions (Signed)
Return to your usual asthma therapy  If you have ANY increased issues please present to the ER without undue delay Stay inside until [employer] [place] has resolved-use masks if you must be outside at all Report to your PCP in the morning to discuss this evening  Asthma Attack Prevention While you may not be able to control the fact that you have asthma, you can take actions to prevent asthma attacks. The best way to prevent asthma attacks is to maintain good control of your asthma. You can achieve this by:  Taking your medicines as directed.  Avoiding things that can irritate your airways or make your asthma symptoms worse (asthma triggers).  Keeping track of how well your asthma is controlled and of any changes in your symptoms.  Responding quickly to worsening asthma symptoms (asthma attack).  Seeking emergency care when it is needed. WHAT ARE SOME WAYS TO PREVENT AN ASTHMA ATTACK? Have a Plan Work with your health care provider to create a written plan for managing and treating your asthma attacks (asthma action plan). This plan includes:  A list of your asthma triggers and how you can avoid them.  Information on when medicines should be taken and when their dosages should be changed.  The use of a device that measures how well your lungs are working (peak flow meter). Monitor Your Asthma Use your peak flow meter and record your results in a journal every day. A drop in your peak flow numbers on one or more days may indicate the start of an asthma attack. This can happen even before you start to feel symptoms. You can prevent an asthma attack from getting worse by following the steps in your asthma action plan. Avoid Asthma Triggers Work with your asthma health care provider to find out what your asthma triggers are. This can be done by:  Allergy testing.  Keeping a journal that notes when asthma attacks occur and the factors that may have contributed to them.  Determining if there  are other medical conditions that are making your asthma worse. Once you have determined your asthma triggers, take steps to avoid them. This may include avoiding excessive or prolonged exposure to:  Dust. Have someone dust and vacuum your home for you once or twice a week. Using a high-efficiency particulate arrestance (HEPA) vacuum is best.  Smoke. This includes campfire smoke, forest fire smoke, and secondhand smoke from tobacco products.  Pet dander. Avoid contact with animals that you know you are allergic to.  Allergens from trees, grasses or pollens. Avoid spending a lot of time outdoors when pollen counts are high, and on very windy days.  Very cold, dry, or humid air.  Mold.  Foods that contain high amounts of sulfites.  Strong odors.  Outdoor air pollutants, such as Lexic[employer].  Indoor air pollutants, such as aerosol sprays and fumes from household cleaners.  Household pests, including dust mites and cockroaches, and pest droppings.  Certain medicines, including NSAIDs. Always talk to your health care provider before stopping or starting any new medicines. Medicines Take over-the-counter and prescription medicines only as told by your health care provider. Many asthma attacks can be prevented by carefully following your medicine schedule. Taking your medicines correctly is especially important when you cannot avoid certain asthma triggers. Act Quickly If an asthma attack does happen, acting quickly can decrease how severe it is and how long it lasts. Take these steps:   Pay attention to your symptoms. If you are coughing,  wheezing, or having difficulty breathing, do not wait to see if your symptoms go away on their own. Follow your asthma action plan.  If you have followed your asthma action plan and your symptoms are not improving, call your health care provider or seek immediate medical care at the nearest hospital. It is important to note how often you need to use  your fast-acting rescue inhaler. If you are using your rescue inhaler more often, it may mean that your asthma is not under control. Adjusting your asthma treatment plan may help you to prevent future asthma attacks and help you to gain better control of your condition. HOW CAN I PREVENT AN ASTHMA ATTACK WHEN I EXERCISE? Follow advice from your health care provider about whether you should use your fast-acting inhaler before exercising. Many people with asthma experience exercise-induced bronchoconstriction (EIB). This condition often worsens during vigorous exercise in cold, humid, or dry environments. Usually, people with EIB can stay very active by pre-treating with a fast-acting inhaler before exercising.   This information is not intended to replace advice given to you by your health care provider. Make sure you discuss any questions you have with your health care provider.   Document Released: 06/10/2009 Document Revised: 03/13/2015 Document Reviewed: 11/22/2014 Elsevier Interactive Patient Education 2016 [place].  Asthma, Acute Bronchospasm Acute bronchospasm caused by asthma is also referred to as an asthma attack. Bronchospasm means your air passages become narrowed. The narrowing is caused by inflammation and tightening of the muscles in the air tubes (bronchi) in your lungs. This can make it hard to breathe or cause you to wheeze and cough. CAUSES Possible triggers are:  Animal dander from the skin, hair, or feathers of animals.  Dust mites contained in house dust.  Cockroaches.  Pollen from trees or grass.  Mold.  Cigarette or tobacco smoke.  Air pollutants such as dust, household cleaners, hair sprays, aerosol sprays, paint fumes, strong chemicals, or strong odors.  Cold air or weather changes. Cold air may trigger inflammation. Winds increase molds and pollens in the air.  Strong emotions such as crying or laughing hard.  Stress.  Certain medicines such as  aspirin or beta-blockers.  Sulfites in foods and drinks, such as dried fruits and wine.  Infections or inflammatory conditions, such as a flu, cold, or inflammation of the nasal membranes (rhinitis).  Gastroesophageal reflux disease (GERD). GERD is a condition where stomach acid backs up into your esophagus.  Exercise or strenuous activity. SIGNS AND SYMPTOMS   Wheezing.  Excessive coughing, particularly at night.  Chest tightness.  Shortness of breath. DIAGNOSIS  Your health care provider will ask you about your medical history and perform a physical exam. A chest X-ray or blood testing may be performed to look for other causes of your symptoms or other conditions that may have triggered your asthma attack. TREATMENT  Treatment is aimed at reducing inflammation and opening up the airways in your lungs. Most asthma attacks are treated with inhaled medicines. These include quick relief or rescue medicines (such as bronchodilators) and controller medicines (such as inhaled corticosteroids). These medicines are sometimes given through an inhaler or a nebulizer. Systemic steroid medicine taken by mouth or given through an IV tube also can be used to reduce the inflammation when an attack is moderate or severe. Antibiotic medicines are only used if a bacterial infection is present.  HOME CARE INSTRUCTIONS   Rest.  Drink plenty of liquids. This helps the mucus to remain thin and  be easily coughed up. Only use caffeine in moderation and do not use alcohol until you have recovered from your illness.  Do not smoke. Avoid being exposed to secondhand smoke.  You play a critical role in keeping yourself in good health. Avoid exposure to things that cause you to wheeze or to have breathing problems.  Keep your medicines up-to-date and available. Carefully follow your health care provider's treatment plan.  Take your medicine exactly as prescribed.  When pollen or pollution is bad, keep  windows closed and use an air conditioner or go to places with air conditioning.  Asthma requires careful medical care. See your health care provider for a follow-up as advised. If you are more than [redacted] weeks pregnant and you were prescribed any new medicines, let your obstetrician know about the visit and how you are doing. Follow up with your health care provider as directed.  After you have recovered from your asthma attack, make an appointment with your outpatient doctor to talk about ways to reduce the likelihood of future attacks. If you do not have a doctor who manages your asthma, make an appointment with a primary care doctor to discuss your asthma. SEEK IMMEDIATE MEDICAL CARE IF:   You are getting worse.  You have trouble breathing. If severe, call your local emergency services (911 in the [place]  You develop chest pain or discomfort.  You are vomiting.  You are not able to keep fluids down.  You are coughing up yellow, green, brown, or bloody sputum.  You have a fever and your symptoms suddenly get worse.  You have trouble swallowing. MAKE SURE YOU:   Understand these instructions.  Will watch your condition.  Will get help right away if you are not doing well or get worse.   This information is not intended to replace advice given to you by your health care provider. Make sure you discuss any questions you have with your health care provider.   Document Released: 10/07/2006 Document Revised: 06/27/2013 Document Reviewed: 12/28/2012 Elsevier Interactive Patient Education [employer].

## 2015-05-25 ENCOUNTER — Encounter: Payer: Self-pay | Admitting: Internal Medicine

## 2015-05-25 DIAGNOSIS — K219 Gastro-esophageal reflux disease without esophagitis: Secondary | ICD-10-CM | POA: Diagnosis present

## 2015-05-25 DIAGNOSIS — J45901 Unspecified asthma with (acute) exacerbation: Secondary | ICD-10-CM | POA: Diagnosis present

## 2015-05-25 DIAGNOSIS — J9600 Acute respiratory failure, unspecified whether with hypoxia or hypercapnia: Secondary | ICD-10-CM | POA: Diagnosis present

## 2015-05-25 DIAGNOSIS — Z833 Family history of diabetes mellitus: Secondary | ICD-10-CM | POA: Diagnosis not present

## 2015-05-25 DIAGNOSIS — Z881 Allergy status to other antibiotic agents status: Secondary | ICD-10-CM | POA: Diagnosis not present

## 2015-05-25 DIAGNOSIS — Z809 Family history of malignant neoplasm, unspecified: Secondary | ICD-10-CM | POA: Diagnosis not present

## 2015-05-25 DIAGNOSIS — Z8249 Family history of ischemic heart disease and other diseases of the circulatory system: Secondary | ICD-10-CM | POA: Diagnosis not present

## 2015-05-25 DIAGNOSIS — J310 Chronic rhinitis: Secondary | ICD-10-CM | POA: Diagnosis present

## 2015-05-25 DIAGNOSIS — G2581 Restless legs syndrome: Secondary | ICD-10-CM | POA: Diagnosis present

## 2015-05-25 DIAGNOSIS — F319 Bipolar disorder, unspecified: Secondary | ICD-10-CM | POA: Diagnosis present

## 2015-05-25 DIAGNOSIS — E876 Hypokalemia: Secondary | ICD-10-CM | POA: Diagnosis present

## 2015-05-25 DIAGNOSIS — I10 Essential (primary) hypertension: Secondary | ICD-10-CM | POA: Diagnosis present

## 2015-05-25 DIAGNOSIS — F4310 Post-traumatic stress disorder, unspecified: Secondary | ICD-10-CM | POA: Diagnosis present

## 2015-05-25 DIAGNOSIS — J45902 Unspecified asthma with status asthmaticus: Secondary | ICD-10-CM | POA: Diagnosis present

## 2015-05-25 DIAGNOSIS — Z825 Family history of asthma and other chronic lower respiratory diseases: Secondary | ICD-10-CM | POA: Diagnosis not present

## 2015-05-25 DIAGNOSIS — M1990 Unspecified osteoarthritis, unspecified site: Secondary | ICD-10-CM | POA: Diagnosis present

## 2015-05-25 DIAGNOSIS — E871 Hypo-osmolality and hyponatremia: Secondary | ICD-10-CM | POA: Diagnosis present

## 2015-05-25 DIAGNOSIS — Z882 Allergy status to sulfonamides status: Secondary | ICD-10-CM | POA: Diagnosis not present

## 2015-05-25 LAB — CBC WITH DIFFERENTIAL/PLATELET
Basophils Absolute: 0 10*3/uL (ref 0–0.1)
Basophils Relative: 0 %
EOS ABS: 0 10*3/uL (ref 0–0.7)
Eosinophils Relative: 0 %
HEMATOCRIT: 39.1 % (ref 35.0–47.0)
HEMOGLOBIN: 13.1 g/dL (ref 12.0–16.0)
LYMPHS ABS: 0.4 10*3/uL — AB (ref 1.0–3.6)
LYMPHS PCT: 4 %
MCH: 30.4 pg (ref 26.0–34.0)
MCHC: 33.6 g/dL (ref 32.0–36.0)
MCV: 90.5 fL (ref 80.0–100.0)
Monocytes Absolute: 0.3 10*3/uL (ref 0.2–0.9)
Monocytes Relative: 3 %
NEUTROS ABS: 10.7 10*3/uL — AB (ref 1.4–6.5)
NEUTROS PCT: 93 %
Platelets: 258 10*3/uL (ref 150–440)
RBC: 4.32 MIL/uL (ref 3.80–5.20)
RDW: 13.3 % (ref 11.5–14.5)
WBC: 11.4 10*3/uL — AB (ref 3.6–11.0)

## 2015-05-25 LAB — BASIC METABOLIC PANEL
ANION GAP: 11 (ref 5–15)
Anion gap: 9 (ref 5–15)
BUN: 11 mg/dL (ref 6–20)
BUN: 9 mg/dL (ref 6–20)
CALCIUM: 9.5 mg/dL (ref 8.9–10.3)
CHLORIDE: 99 mmol/L — AB (ref 101–111)
CO2: 17 mmol/L — ABNORMAL LOW (ref 22–32)
CO2: 22 mmol/L (ref 22–32)
CREATININE: 0.6 mg/dL (ref 0.44–1.00)
Calcium: 7.9 mg/dL — ABNORMAL LOW (ref 8.9–10.3)
Chloride: 102 mmol/L (ref 101–111)
Creatinine, Ser: 0.66 mg/dL (ref 0.44–1.00)
GFR calc Af Amer: >60 mL/min (ref >60)
GFR calc Af Amer: >60 mL/min (ref >60)
GFR calc non Af Amer: >60 mL/min (ref >60)
GFR calc non Af Amer: >60 mL/min (ref >60)
GLUCOSE: 175 mg/dL — AB (ref 65–99)
Glucose, Bld: 188 mg/dL — ABNORMAL HIGH (ref 65–99)
POTASSIUM: 2.9 mmol/L — AB (ref 3.5–5.1)
Potassium: 4.5 mmol/L (ref 3.5–5.1)
SODIUM: 130 mmol/L — AB (ref 135–145)
Sodium: 130 mmol/L — ABNORMAL LOW (ref 135–145)

## 2015-05-25 LAB — TROPONIN I: Troponin I: <0.03 ng/mL (ref <0.031)

## 2015-05-25 MED ORDER — POTASSIUM CHLORIDE CRYS ER 20 MEQ PO TBCR
40.0000 meq | EXTENDED_RELEASE_TABLET | Freq: Once | ORAL | Status: AC
  Administered 2015-05-25: 40 meq via ORAL
  Filled 2015-05-25: qty 2

## 2015-05-25 MED ORDER — ACETAMINOPHEN 650 MG RE SUPP: 650.0000 mg | Freq: Four times a day (QID) | RECTAL | Status: DC | PRN

## 2015-05-25 MED ORDER — ARIPIPRAZOLE 5 MG PO TABS
30.0000 mg | ORAL_TABLET | Freq: Every day | ORAL | Status: DC
  Administered 2015-05-25 – 2015-05-26 (×2): 30 mg via ORAL
  Filled 2015-05-25 (×2): qty 6

## 2015-05-25 MED ORDER — ONDANSETRON HCL 4 MG PO TABS: 4.0000 mg | ORAL_TABLET | Freq: Four times a day (QID) | ORAL | Status: DC | PRN

## 2015-05-25 MED ORDER — TRAMADOL HCL 50 MG PO TABS: 100.0000 mg | ORAL_TABLET | Freq: Every day | ORAL | Status: DC

## 2015-05-25 MED ORDER — HEPARIN SODIUM (PORCINE) 5000 UNIT/ML IJ SOLN
5000.0000 [IU] | Freq: Three times a day (TID) | INTRAMUSCULAR | Status: DC
  Administered 2015-05-25 – 2015-05-26 (×4): 5000 [IU] via SUBCUTANEOUS
  Filled 2015-05-25 (×4): qty 1

## 2015-05-25 MED ORDER — CLONAZEPAM 0.5 MG PO TABS
0.5000 mg | ORAL_TABLET | Freq: Two times a day (BID) | ORAL | Status: DC
  Administered 2015-05-25 – 2015-05-26 (×2): 0.5 mg via ORAL
  Filled 2015-05-25 (×2): qty 1

## 2015-05-25 MED ORDER — VITAMIN D (ERGOCALCIFEROL) 1.25 MG (50000 UNIT) PO CAPS
50000.0000 [IU] | ORAL_CAPSULE | ORAL | Status: DC
  Administered 2015-05-25: 50000 [IU] via ORAL
  Filled 2015-05-25: qty 1

## 2015-05-25 MED ORDER — ONDANSETRON HCL 4 MG/2ML IJ SOLN
4.0000 mg | Freq: Four times a day (QID) | INTRAMUSCULAR | Status: DC | PRN
  Administered 2015-05-25 – 2015-05-26 (×2): 4 mg via INTRAVENOUS
  Filled 2015-05-25 (×2): qty 2

## 2015-05-25 MED ORDER — LISINOPRIL 10 MG PO TABS
10.0000 mg | ORAL_TABLET | Freq: Every day | ORAL | Status: DC
Start: 2015-05-25 — End: 2015-05-25

## 2015-05-25 MED ORDER — GUAIFENESIN ER 600 MG PO TB12
600.0000 mg | ORAL_TABLET | Freq: Two times a day (BID) | ORAL | Status: DC
  Administered 2015-05-25 – 2015-05-26 (×3): 600 mg via ORAL
  Filled 2015-05-25 (×3): qty 1

## 2015-05-25 MED ORDER — IPRATROPIUM-ALBUTEROL 0.5-2.5 (3) MG/3ML IN SOLN
3.0000 mL | RESPIRATORY_TRACT | Status: DC
  Administered 2015-05-25 (×4): 3 mL via RESPIRATORY_TRACT
  Filled 2015-05-25 (×5): qty 3

## 2015-05-25 MED ORDER — TRAMADOL HCL 50 MG PO TABS
100.0000 mg | ORAL_TABLET | Freq: Two times a day (BID) | ORAL | Status: DC
  Administered 2015-05-25: 100 mg via ORAL
  Filled 2015-05-25: qty 2

## 2015-05-25 MED ORDER — VALACYCLOVIR HCL 500 MG PO TABS
500.0000 mg | ORAL_TABLET | Freq: Every day | ORAL | Status: DC
  Administered 2015-05-25: 500 mg via ORAL
  Filled 2015-05-25: qty 1

## 2015-05-25 MED ORDER — CLONAZEPAM 1 MG PO TABS
1.0000 mg | ORAL_TABLET | Freq: Two times a day (BID) | ORAL | Status: DC
Start: 2015-05-25 — End: 2015-05-25
  Administered 2015-05-25: 1 mg via ORAL
  Filled 2015-05-25: qty 1

## 2015-05-25 MED ORDER — ACETAMINOPHEN 325 MG PO TABS
650.0000 mg | ORAL_TABLET | Freq: Four times a day (QID) | ORAL | Status: DC | PRN
  Administered 2015-05-25 – 2015-05-26 (×2): 650 mg via ORAL
  Filled 2015-05-25 (×2): qty 2

## 2015-05-25 MED ORDER — OXYCODONE HCL 5 MG PO TABS
5.0000 mg | ORAL_TABLET | ORAL | Status: DC | PRN
  Administered 2015-05-25: 5 mg via ORAL
  Filled 2015-05-25: qty 1

## 2015-05-25 MED ORDER — METHYLPREDNISOLONE SODIUM SUCC 125 MG IJ SOLR
60.0000 mg | Freq: Four times a day (QID) | INTRAMUSCULAR | Status: DC
  Administered 2015-05-25 – 2015-05-26 (×5): 60 mg via INTRAVENOUS
  Filled 2015-05-25 (×6): qty 2

## 2015-05-25 MED ORDER — ALBUTEROL SULFATE (2.5 MG/3ML) 0.083% IN NEBU
2.5000 mg | INHALATION_SOLUTION | Freq: Once | RESPIRATORY_TRACT | Status: AC
  Administered 2015-05-25: 2.5 mg via RESPIRATORY_TRACT
  Filled 2015-05-25: qty 3

## 2015-05-25 MED ORDER — METHYLPREDNISOLONE SODIUM SUCC 125 MG IJ SOLR
60.0000 mg | INTRAMUSCULAR | Status: DC
  Administered 2015-05-25: 60 mg via INTRAVENOUS
  Filled 2015-05-25: qty 2

## 2015-05-25 MED ORDER — OXCARBAZEPINE 150 MG PO TABS
150.0000 mg | ORAL_TABLET | Freq: Two times a day (BID) | ORAL | Status: DC
  Administered 2015-05-25 – 2015-05-26 (×3): 150 mg via ORAL
  Filled 2015-05-25 (×4): qty 1

## 2015-05-25 MED ORDER — PANTOPRAZOLE SODIUM 40 MG PO TBEC
40.0000 mg | DELAYED_RELEASE_TABLET | Freq: Every day | ORAL | Status: DC
  Administered 2015-05-25 – 2015-05-26 (×2): 40 mg via ORAL
  Filled 2015-05-25 (×2): qty 1

## 2015-05-25 MED ORDER — ARIPIPRAZOLE 5 MG PO TABS: 20.0000 mg | ORAL_TABLET | Freq: Every day | ORAL | Status: DC

## 2015-05-25 MED ORDER — AZITHROMYCIN 500 MG IV SOLR
500.0000 mg | INTRAVENOUS | Status: DC
  Administered 2015-05-25 – 2015-05-26 (×2): 500 mg via INTRAVENOUS
  Filled 2015-05-25 (×2): qty 500

## 2015-05-25 NOTE — Progress Notes (Addendum)
[place] at [facility]                                                                                                                                                                                            Patient Demographics   Brandi Bell, is a 54 y.o. female, DOB - 09/13/1960, [id number]  Admit date - 05/24/2015   Admitting Physician Lytle Butte, MD  Outpatient Primary MD for the patient is Pcp Not In System   LOS - 0  Subjective: Patient is breathing improved compared to admission but still short of breath and wheezing. Has some dry cough     Review of Systems:   CONSTITUTIONAL: No documented fever. No fatigue, weakness. No weight gain, no weight loss.  EYES: No blurry or double vision.  ENT: No tinnitus. No postnasal drip. No redness of the oropharynx.  RESPIRATORY: Dry cough, positive wheeze, no hemoptysis. Positive dyspnea.  CARDIOVASCULAR: No chest pain. No orthopnea. No palpitations. No syncope.  GASTROINTESTINAL: No nausea, no vomiting or diarrhea. No abdominal pain. No melena or hematochezia.  GENITOURINARY: No dysuria or hematuria.  ENDOCRINE: No polyuria or nocturia. No heat or cold intolerance.  HEMATOLOGY: No anemia. No bruising. No bleeding.  INTEGUMENTARY: No rashes. No lesions.  MUSCULOSKELETAL: No arthritis. No swelling. No gout.  NEUROLOGIC: No numbness, tingling, or ataxia. No seizure-type activity.  PSYCHIATRIC: No anxiety. No insomnia. No ADD.    Vitals:   Filed Vitals:   05/25/15 0330 05/25/15 0421 05/25/15 0457 05/25/15 0734  BP: 117/60 129/75 105/60 99/47  Pulse: 82 101 88 94  Temp:  98.4 F (36.9 C) 97.7 F (36.5 C) 97.5 F (36.4 C)  TempSrc:  Oral Oral Oral  Resp: 18 18 18 20   Height:      Weight:      SpO2: 91% 97% 96% 100%    Wt Readings from Last 3 Encounters:  05/24/15 95.255 kg (210 lb)  05/24/15 95.255 kg (210 lb)  02/09/15 94.348 kg (208 lb)     Intake/Output Summary (Last 24  hours) at 05/25/15 1338 Last data filed at 05/25/15 0900  Gross per 24 hour  Intake    240 ml  Output      0 ml  Net    240 ml    Physical Exam:   GENERAL: Pleasant-appearing in no apparent distress.  HEAD, EYES, EARS, NOSE AND THROAT: Atraumatic, normocephalic. Extraocular muscles are intact. Pupils equal and reactive to light. Sclerae anicteric. No conjunctival injection. No oro-pharyngeal erythema.  NECK: Supple. There is no jugular venous distention. No bruits, no lymphadenopathy, no thyromegaly.  HEART: Regular rate and rhythm,. No murmurs, no rubs, no clicks.  LUNGS: Bilateral wheezing throughout both lungs no rhonchi or crackles no necessary muscle usage ABDOMEN: Soft, flat, nontender, nondistended. Has good bowel sounds. No hepatosplenomegaly appreciated.  EXTREMITIES: No evidence of any cyanosis, clubbing, or peripheral edema.  +2 pedal and radial pulses bilaterally.  NEUROLOGIC: The patient is alert, awake, and oriented x3 with no focal motor or sensory deficits appreciated bilaterally.  SKIN: Moist and warm with no rashes appreciated.  Psych: Not anxious, depressed LN: No inguinal LN enlargement    Antibiotics   Anti-infectives    Start     Dose/Rate Route Frequency Ordered Stop   05/25/15 2200  valACYclovir (VALTREX) tablet 500 mg     500 mg Oral Daily at bedtime 05/25/15 0449     05/25/15 1115  azithromycin (ZITHROMAX) 500 mg in dextrose 5 % 250 mL IVPB     500 mg 250 mL/hr over 60 Minutes Intravenous Every 24 hours 05/25/15 1107        Medications   Scheduled Meds: . ARIPiprazole  30 mg Oral Daily  . azithromycin  500 mg Intravenous Q24H  . clonazePAM  0.5 mg Oral Q12H  . guaiFENesin  600 mg Oral BID  . heparin  5,000 Units Subcutaneous 3 times per day  . ipratropium-albuterol  3 mL Nebulization Q4H  . methylPREDNISolone (SOLU-MEDROL) injection  60 mg Intravenous Q6H  . OXcarbazepine  150 mg Oral BID  . pantoprazole  40 mg Oral Daily  . [START ON  05/26/2015] traMADol  100 mg Oral QHS  . valACYclovir  500 mg Oral QHS  . Vitamin D (Ergocalciferol)  50,000 Units Oral Q7 days   Continuous Infusions:  PRN Meds:.acetaminophen **OR** acetaminophen, ondansetron **OR** ondansetron (ZOFRAN) IV, oxyCODONE   Data Review:   Micro Results No results found for this or any previous visit (from the past 240 hour(s)).  Radiology Reports Dg Chest 2 View  05/24/2015  CLINICAL DATA:  Patient with cough and shortness of breath. EXAM: CHEST  2 VIEW COMPARISON:  Chest radiograph 11/06/2013. FINDINGS: Stable cardiac and mediastinal contours. No consolidative pulmonary opacities. No pleural effusion or pneumothorax. Regional skeleton is unremarkable. IMPRESSION: No acute cardiopulmonary process. Electronically Signed   By: Lovey Newcomer M.D.   On: 05/24/2015 23:15     CBC  Recent Labs Lab 05/24/15 2336 05/25/15 1033  WBC 10.3 11.4*  HGB 13.9 13.1  HCT 41.0 39.1  PLT 252 258  MCV 90.2 90.5  MCH 30.6 30.4  MCHC 33.9 33.6  RDW 13.1 13.3  LYMPHSABS 0.2* 0.4*  MONOABS 0.1* 0.3  EOSABS 0.0 0.0  BASOSABS 0.0 0.0    Chemistries   Recent Labs Lab 05/24/15 2336 05/25/15 1033  NA 130* 130*  K 2.9* 4.5  CL 102 99*  CO2 17* 22  GLUCOSE 175* 188*  BUN 9 11  CREATININE 0.66 0.60  CALCIUM 7.9* 9.5   ------------------------------------------------------------------------------------------------------------------ estimated creatinine clearance is 84.8 mL/min (by C-G formula based on Cr of 0.6). ------------------------------------------------------------------------------------------------------------------ No results for input(s): HGBA1C in the last 72 hours. ------------------------------------------------------------------------------------------------------------------ No results for input(s): CHOL, HDL, LDLCALC, TRIG, CHOLHDL, LDLDIRECT in the last 72  hours. ------------------------------------------------------------------------------------------------------------------ No results for input(s): TSH, T4TOTAL, T3FREE, THYROIDAB in the last 72 hours.  Invalid input(s): FREET3 ------------------------------------------------------------------------------------------------------------------ No results for input(s): VITAMINB12, FOLATE, FERRITIN, TIBC, IRON, RETICCTPCT in the last 72 hours.  Coagulation profile No results for input(s): INR, PROTIME in the last 168 hours.  No results for input(s): DDIMER  in the last 72 hours.  Cardiac Enzymes  Recent Labs Lab 05/24/15 2336  TROPONINI <0.03   ------------------------------------------------------------------------------------------------------------------ Invalid input(s): POCBNP    Assessment & Plan   55 year old Caucasian female history of asthma previously requiring intubation presenting with shortness of breath  1. Asthma exacerbation/acute respiratory failure I will change her Solu-Medrol to every 6 hours due to persistent wheezing, also has cough I will start her on azithromycin for acute bronchitis, add Mucinex to assist with cough loosening 2. Hypokalemia: Replace replaced 3. Hyponatremia: IV fluid hydration sodium continues to be low continue to monitor IV. Essential hypertension: Continue Lisinopril 5. Venous embolism prophylactic: Heparin subcutaneous      Code Status Orders        Start     Ordered   05/25/15 0338  Full code   Continuous     05/25/15 0338           Consults none DVT Prophylaxis  heparin  Lab Results  Component Value Date   PLT 258 05/25/2015     Time Spent in minutes   45 minutes  Dustin Flock M.D on 05/25/2015 at 1:38 PM  Between 7am to 6pm - Pager - [phone]  After 6pm go to [email or web address] - password [email or web address] [facility]   Office  [phone]

## 2015-05-25 NOTE — H&P (Signed)
[facility] [place] NAME: Brandi Bell    MR#:  [id number]  DATE OF BIRTH:  1960-07-13   DATE OF ADMISSION:  05/24/2015  PRIMARY CARE PHYSICIAN: Pcp Not In System   REQUESTING/REFERRING PHYSICIAN: Karma Greaser  CHIEF COMPLAINT:   Chief Complaint  Patient presents with  . Shortness of Breath  . Cough    HISTORY OF PRESENT ILLNESS:  Brandi Bell  is a 54 y.o. female with a known history of asthma requiring intubation previously 2 is presenting shortness of breath. She states 1 day duration shortness of breath occurring after being outside in the [place] environment. She experienced cough nonproductive no fever no chills with this acute shortness of breath. She tried multiple breathing treatments prior to coming to the hospital on arrival she was in acute respiratory distress requiring BiPAP therapy for this she was able to come off of BiPAP and has improved somewhat since initial presentation  PAST MEDICAL HISTORY:   Past Medical History  Diagnosis Date  . Depression   . Suicide attempt ([place])   . Microhematuria   . History of migraine headaches   . GERD (gastroesophageal reflux disease)   . Rhinitis   . Bipolar disorder ([place])   . PTSD (post-traumatic stress disorder)   . Anxiety   . Arthritis     Feet, Hands  . Spinal stenosis of cervical region   . Bulging lumbar disc   . Shortness of breath dyspnea   . Elevated LFTs     having ultrasound [facility] - 12/14/14  . Restless leg syndrome   . Bipolar disorder ([place])   . Asthma     [facility] admission - on Bipap - 7/13 - report on paper chart  . PONV (postoperative nausea and vomiting)   . Motion sickness   . Hypertension     Echo -1/16 - report on paper chart    PAST SURGICAL HISTORY:   Past Surgical History  Procedure Laterality Date  . Vaginal hysterectomy    . Tubaligation    . Cholecystectomy    . Foot surgery      multiple  . Septoplasty    . Anterior and posterior  repair    . Bladder tact    . Shoulder arthroscopy    . Tonsillectomy    . Cardiac catheterization  2008    [facility] - neg - report in paper chart  . Colonoscopy N/A 01/04/2015    Procedure: COLONOSCOPY;  Surgeon: Lucilla Lame, MD;  Location: [facility];  Service: Gastroenterology;  Laterality: N/A;  with biopsies    SOCIAL HISTORY:   Social History  Substance Use Topics  . Smoking status: Never Smoker   . Smokeless tobacco: Not on file  . Alcohol Use: No    FAMILY HISTORY:   Family History  Problem Relation Age of Onset  . Asthma Mother   . Cancer Mother   . Diabetes Mother   . Heart attack Father     DRUG ALLERGIES:   Allergies  Allergen Reactions  . Ciprofloxacin Anaphylaxis  . Amoxicillin Hives  . Morphine And Related Nausea And Vomiting  . Pineapple Other (See Comments)    Reaction:  Mouth blisters  . Sulfa Antibiotics Hives  . Tape Other (See Comments)    Reaction:  Clear tape tears skin.  Tegaderm and paper tape are ok.      REVIEW OF SYSTEMS:  REVIEW OF SYSTEMS:  CONSTITUTIONAL: Denies fevers, chills, positive  fatigue, weakness.  EYES: Denies blurred vision, double vision, or eye pain.  EARS, NOSE, THROAT: Denies tinnitus, ear pain, hearing loss.  RESPIRATORY: Positive cough, shortness of breath, denies wheezing  CARDIOVASCULAR: Denies chest pain, palpitations, edema.  GASTROINTESTINAL: Denies nausea, vomiting, diarrhea, abdominal pain.  GENITOURINARY: Denies dysuria, hematuria.  ENDOCRINE: Denies nocturia or thyroid problems. HEMATOLOGIC AND LYMPHATIC: Denies easy bruising or bleeding.  SKIN: Denies rash or lesions.  MUSCULOSKELETAL: Denies pain in neck, back, shoulder, knees, hips, or further arthritic symptoms.  NEUROLOGIC: Denies paralysis, paresthesias.  PSYCHIATRIC: Denies anxiety or depressive symptoms. Otherwise full review of systems performed by me is negative.   MEDICATIONS AT HOME:   Prior to Admission medications   Medication Sig  Start Date End Date Taking? Authorizing Provider  albuterol (PROVENTIL HFA;VENTOLIN HFA) 108 (90 BASE) MCG/ACT inhaler Inhale 1-2 puffs into the lungs every 6 (six) hours as needed for wheezing or shortness of breath.     Historical Provider, MD  ARIPiprazole (ABILIFY) 20 MG tablet Take 20 mg by mouth daily.    Historical Provider, MD  clonazePAM (KLONOPIN) 1 MG tablet Take 1 mg by mouth every 12 (twelve) hours.    Historical Provider, MD  HYDROcodone-acetaminophen (NORCO) 10-325 MG per tablet Take 1 tablet by mouth every 6 (six) hours as needed for severe pain.    Historical Provider, MD  lisinopril (PRINIVIL,ZESTRIL) 10 MG tablet Take 10 mg by mouth daily.     Historical Provider, MD  ondansetron (ZOFRAN-ODT) 8 MG disintegrating tablet Take 8 mg by mouth every 8 (eight) hours as needed for nausea or vomiting.    Historical Provider, MD  pantoprazole (PROTONIX) 40 MG tablet Take 40 mg by mouth daily.     Historical Provider, MD  predniSONE (DELTASONE) 10 MG tablet Take 2 tablets (20 mg total) by mouth daily. For 3 days 05/24/15   Jan Fireman, PA-C  rizatriptan (MAXALT-MLT) 10 MG disintegrating tablet Take 10 mg by mouth as needed for migraine. May repeat in 2 hours if needed    Historical Provider, MD  traMADol (ULTRAM) 50 MG tablet Take 100 mg by mouth 2 (two) times daily.    Historical Provider, MD  valACYclovir (VALTREX) 500 MG tablet Take 500 mg by mouth at bedtime.     Historical Provider, MD  Vitamin D, Ergocalciferol, (DRISDOL) 50000 UNITS CAPS capsule Take 50,000 Units by mouth every 7 (seven) days.    Historical Provider, MD      VITAL SIGNS:  Blood pressure 117/60, pulse 82, temperature 98.2 F (36.8 C), temperature source Oral, resp. rate 18, height 5\' 1"  (1.549 m), weight 210 lb (95.255 kg), SpO2 91 %.  PHYSICAL EXAMINATION:  VITAL SIGNS: Filed Vitals:   05/25/15 0330  BP: 117/60  Pulse: 82  Temp:   Resp: 18   GENERAL:54 y.o.female currently in no acute distress.  HEAD:  Normocephalic, atraumatic.  EYES: Pupils equal, round, reactive to light. Extraocular muscles intact. No scleral icterus.  MOUTH: Moist mucosal membrane. Dentition intact. No abscess noted.  EAR, NOSE, THROAT: Clear without exudates. No external lesions.  NECK: Supple. No thyromegaly. No nodules. No JVD.  PULMONARY: Grossly diminished breath sounds without wheeze rails or rhonci. Tachypneic without use of accessory muscles, Good respiratory effort. Poor air entry bilaterally CHEST: Nontender to palpation.  CARDIOVASCULAR: S1 and S2. Tachycardic. No murmurs, rubs, or gallops. No edema. Pedal pulses 2+ bilaterally.  GASTROINTESTINAL: Soft, nontender, nondistended. No masses. Positive bowel sounds. No hepatosplenomegaly.  MUSCULOSKELETAL: No swelling, clubbing, or edema. Range  of motion full in all extremities.  NEUROLOGIC: Cranial nerves II through XII are intact. No gross focal neurological deficits. Sensation intact. Reflexes intact.  SKIN: No ulceration, lesions, rashes, or cyanosis. Skin warm and dry. Turgor intact.  PSYCHIATRIC: Mood, affect within normal limits. The patient is awake, alert and oriented x 3. Insight, judgment intact.    LABORATORY PANEL:   CBC  Recent Labs Lab 05/24/15 2336  WBC 10.3  HGB 13.9  HCT 41.0  PLT 252   ------------------------------------------------------------------------------------------------------------------  Chemistries   Recent Labs Lab 05/24/15 2336  NA 130*  K 2.9*  CL 102  CO2 17*  GLUCOSE 175*  BUN 9  CREATININE 0.66  CALCIUM 7.9*   ------------------------------------------------------------------------------------------------------------------  Cardiac Enzymes  Recent Labs Lab 05/24/15 2336  TROPONINI <0.03   ------------------------------------------------------------------------------------------------------------------  RADIOLOGY:  Dg Chest 2 View  05/24/2015  CLINICAL DATA:  Patient with cough and shortness of  breath. EXAM: CHEST  2 VIEW COMPARISON:  Chest radiograph 11/06/2013. FINDINGS: Stable cardiac and mediastinal contours. No consolidative pulmonary opacities. No pleural effusion or pneumothorax. Regional skeleton is unremarkable. IMPRESSION: No acute cardiopulmonary process. Electronically Signed   By: Lovey Newcomer M.D.   On: 05/24/2015 23:15    EKG:   Orders placed or performed during the hospital encounter of 05/24/15  . ED EKG  . ED EKG    IMPRESSION AND PLAN:   54 year old Caucasian female history of asthma previously requiring intubation presenting with shortness of breath  1. Asthma exacerbation/acute respiratory insufficiency: Continue with steroids, breathing treatments every 4 hours maintaining to increase to every 2 hours depending on her condition 2. Hypokalemia: Replace potassium goal 4-5 3. Hyponatremia: IV fluid hydration and follow sodium level IV. Essential hypertension: Lisinopril 5. Venous embolism prophylactic: Heparin subcutaneous    All the records are reviewed and case discussed with ED provider. Management plans discussed with the patient, family and they are in agreement.  CODE STATUS: Full  TOTAL TIME TAKING CARE OF THIS PATIENT: 35 minutes.    Hower,  Karenann Cai.D on 05/25/2015 at 4:05 AM  Between 7am to 6pm - Pager - [phone]  After 6pm: House Pager: - [phone]  [facility]  Office  [phone]  CC: Primary care physician; Pcp Not In System

## 2015-05-25 NOTE — Progress Notes (Signed)
Up to hallway. Less dyspnea. Decreased breath sounds. np cough. Tolerating iv steroids and zithromax

## 2015-05-26 LAB — BASIC METABOLIC PANEL
Anion gap: 6 (ref 5–15)
BUN: 8 mg/dL (ref 6–20)
CHLORIDE: 106 mmol/L (ref 101–111)
CO2: 24 mmol/L (ref 22–32)
Calcium: 9.2 mg/dL (ref 8.9–10.3)
Creatinine, Ser: 0.48 mg/dL (ref 0.44–1.00)
GFR calc Af Amer: >60 mL/min (ref >60)
GLUCOSE: 153 mg/dL — AB (ref 65–99)
POTASSIUM: 4.8 mmol/L (ref 3.5–5.1)
SODIUM: 136 mmol/L (ref 135–145)

## 2015-05-26 MED ORDER — GUAIFENESIN ER 600 MG PO TB12: 600.0000 mg | ORAL_TABLET | Freq: Two times a day (BID) | ORAL | Status: DC

## 2015-05-26 MED ORDER — PREDNISONE 10 MG (21) PO TBPK: 10.0000 mg | ORAL_TABLET | Freq: Every day | ORAL | Status: DC

## 2015-05-26 MED ORDER — AZITHROMYCIN 500 MG PO TABS: 500.0000 mg | ORAL_TABLET | Freq: Every day | ORAL | Status: DC

## 2015-05-26 NOTE — Care Management Important Message (Signed)
Important Message  Patient Details  Name: Brandi Bell MRN: [id number] Date of Birth: Apr 14, 1961   Medicare Important Message Given:  Yes    Daliyah Sramek A, RN 05/26/2015, 9:41 AM

## 2015-05-26 NOTE — Care Management Note (Addendum)
Case Management Note  Patient Details  Name: Brandi Bell MRN: [id number] Date of Birth: 10/23/1960  Subjective/Objective: A Case Management referral was made to assist [name] with the cost of co-pay for her discharge antibiotic. [name] has two different insurers. Per [facility] in [place], the co-pay for Zithromax for [name] will be $0.73cents. No other discharge needs.                    Action/Plan:   Expected Discharge Date:  05/27/15               Expected Discharge Plan:     In-House Referral:     Discharge planning Services     Post Acute Care Choice:    Choice offered to:     DME Arranged:    DME Agency:     HH Arranged:    [place] Agency:     Status of Service:     Medicare Important Message Given:  Yes Date Medicare IM Given:    Medicare IM give by:    Date Additional Medicare IM Given:    Additional Medicare Important Message give by:     If discussed at [place] of Stay Meetings, dates discussed:    Additional Comments:  Hassani Sliney A, RN 05/26/2015, 11:37 AM

## 2015-05-26 NOTE — Progress Notes (Signed)
Pt discharged  For hospital via w/c. Will be discharged home on prednisone and zithromax. Med teaching complete

## 2015-05-26 NOTE — Discharge Summary (Signed)
Brandi Bell, 54 y.o., DOB 1960-08-11, MRN [id number]. Admission date: 05/24/2015 Discharge Date 05/26/2015 Primary MD Pcp Not In System Admitting Physician Lytle Butte, MD  Admission Diagnosis  Acute severe exacerbation of asthma [J45.901]  Discharge Diagnosis   Principal Problem:   Asthma exacerbation   Acute bronchitis Depression Suicidal attempt History of migraine headaches GERD Rhinitis Bipolar disorder PTSD Anxiety Arthritis Cervical spinal stenosis Elevated LFTs Restless leg syndrome Hypertension     Hospital Course    Brandi Bell is a 54 y.o. female with a known history of asthma requiring intubation previously 2 is presented with shortness of breath. She was also complaining of cough. She tried multiple breathing treatments prior to coming to the hospital on arrival she was in acute respiratory distress requiring BiPAP therapy . Patient was admitted to the hospital continued on nebulizers steroids and  Antibiotics. She is doing much better shortness of breath is resolved and her wheezing is resolved she wants to go home.           Consults  None  Significant Tests:  See full reports for all details    Dg Chest 2 View  05/24/2015  CLINICAL DATA:  Patient with cough and shortness of breath. EXAM: CHEST  2 VIEW COMPARISON:  Chest radiograph 11/06/2013. FINDINGS: Stable cardiac and mediastinal contours. No consolidative pulmonary opacities. No pleural effusion or pneumothorax. Regional skeleton is unremarkable. IMPRESSION: No acute cardiopulmonary process. Electronically Signed   By: Lovey Newcomer M.D.   On: 05/24/2015 23:15       Today   Subjective:   Brandi Bell  feels much better shortness of breath resolved  Objective:   Blood pressure 125/85, pulse 84, temperature 97.7 F (36.5 C), temperature source Oral, resp. rate 16, height 5\' 1"  (1.549 m), weight 95.255 kg (210 lb), SpO2 99 %.  .  Intake/Output Summary (Last 24 hours) at 05/26/15  1255 Last data filed at 05/26/15 0900  Gross per 24 hour  Intake    720 ml  Output      0 ml  Net    720 ml    Exam VITAL SIGNS: Blood pressure 125/85, pulse 84, temperature 97.7 F (36.5 C), temperature source Oral, resp. rate 16, height 5\' 1"  (1.549 m), weight 95.255 kg (210 lb), SpO2 99 %.  GENERAL:  54 y.o.-year-old patient lying in the bed with no acute distress.  EYES: Pupils equal, round, reactive to light and accommodation. No scleral icterus. Extraocular muscles intact.  HEENT: Head atraumatic, normocephalic. Oropharynx and nasopharynx clear.  NECK:  Supple, no jugular venous distention. No thyroid enlargement, no tenderness.  LUNGS: Normal breath sounds bilaterally, no wheezing, rales,rhonchi or crepitation. No use of accessory muscles of respiration.  CARDIOVASCULAR: S1, S2 normal. No murmurs, rubs, or gallops.  ABDOMEN: Soft, nontender, nondistended. Bowel sounds present. No organomegaly or mass.  EXTREMITIES: No pedal edema, cyanosis, or clubbing.  NEUROLOGIC: Cranial nerves II through XII are intact. Muscle strength 5/5 in all extremities. Sensation intact. Gait not checked.  PSYCHIATRIC: The patient is alert and oriented x 3.  SKIN: No obvious rash, lesion, or ulcer.   Data Review     CBC w Diff: Lab Results  Component Value Date   WBC 11.4* 05/25/2015   WBC 13.1* 11/07/2013   HGB 13.1 05/25/2015   HGB 13.2 11/07/2013   HCT 39.1 05/25/2015   HCT 39.0 11/07/2013   PLT 258 05/25/2015   PLT 217 11/07/2013   LYMPHOPCT 4 05/25/2015   LYMPHOPCT 2.0  11/07/2013   MONOPCT 3 05/25/2015   MONOPCT 3.1 11/07/2013   EOSPCT 0 05/25/2015   EOSPCT 0.0 11/07/2013   BASOPCT 0 05/25/2015   BASOPCT 0.1 11/07/2013   CMP: Lab Results  Component Value Date   NA 136 05/26/2015   NA 138 11/07/2013   K 4.8 05/26/2015   K 3.4* 11/07/2013   CL 106 05/26/2015   CL 107 11/07/2013   CO2 24 05/26/2015   CO2 20* 11/07/2013   BUN 8 05/26/2015   BUN 9 11/07/2013   CREATININE  0.48 05/26/2015   CREATININE 0.80 11/07/2013   PROT 7.1 02/08/2015   PROT 6.6 03/15/2013   ALBUMIN 4.0 02/08/2015   ALBUMIN 3.5 03/15/2013   BILITOT 0.2* 02/08/2015   BILITOT 0.8 03/15/2013   ALKPHOS 91 02/08/2015   ALKPHOS 135 03/15/2013   AST 22 02/08/2015   AST 43* 03/15/2013   ALT 30 02/08/2015   ALT 77 03/15/2013  .  Micro Results No results found for this or any previous visit (from the past 240 hour(s)).      Code Status Orders        Start     Ordered   05/25/15 0338  Full code   Continuous     05/25/15 0338          Follow-up Information    Follow up with pcp In 7 days.      Discharge Medications     Medication List    STOP taking these medications        predniSONE 10 MG tablet  Commonly known as:  DELTASONE  Replaced by:  predniSONE 10 MG (21) Tbpk tablet      TAKE these medications        albuterol 108 (90 BASE) MCG/ACT inhaler  Commonly known as:  PROVENTIL HFA;VENTOLIN HFA  Inhale 1-2 puffs into the lungs every 6 (six) hours as needed for wheezing or shortness of breath.     ARIPiprazole 20 MG tablet  Commonly known as:  ABILIFY  Take 20 mg by mouth daily.     azithromycin 500 MG tablet  Commonly known as:  ZITHROMAX  Take 1 tablet (500 mg total) by mouth daily.     clonazePAM 1 MG tablet  Commonly known as:  KLONOPIN  Take 1 mg by mouth every 12 (twelve) hours.     guaiFENesin 600 MG 12 hr tablet  Commonly known as:  MUCINEX  Take 1 tablet (600 mg total) by mouth 2 (two) times daily.     HYDROcodone-acetaminophen 10-325 MG tablet  Commonly known as:  NORCO  Take 1 tablet by mouth every 6 (six) hours as needed for severe pain.     lisinopril 10 MG tablet  Commonly known as:  PRINIVIL,ZESTRIL  Take 10 mg by mouth daily.     ondansetron 8 MG disintegrating tablet  Commonly known as:  ZOFRAN-ODT  Take 8 mg by mouth every 8 (eight) hours as needed for nausea or vomiting.     pantoprazole 40 MG tablet  Commonly known as:   PROTONIX  Take 40 mg by mouth daily.     predniSONE 10 MG (21) Tbpk tablet  Commonly known as:  STERAPRED UNI-PAK 21 TAB  Take 1 tablet (10 mg total) by mouth daily. Label  & dispense according to the schedule below.  5tablets day one, then 4 tablets day 2, then 3 tablets day 3, then2 tablets day 4, 1 tablets day 5     rizatriptan 10  MG disintegrating tablet  Commonly known as:  MAXALT-MLT  Take 10 mg by mouth as needed for migraine. May repeat in 2 hours if needed     rOPINIRole 3 MG tablet  Commonly known as:  REQUIP  Take 3 mg by mouth at bedtime.     traMADol 50 MG tablet  Commonly known as:  ULTRAM  Take 100 mg by mouth 2 (two) times daily.     valACYclovir 500 MG tablet  Commonly known as:  VALTREX  Take 500 mg by mouth at bedtime.     Vitamin D (Ergocalciferol) 50000 UNITS Caps capsule  Commonly known as:  DRISDOL  Take 50,000 Units by mouth every 7 (seven) days.           Total Time in preparing paper work, data evaluation and todays exam - 35 minutes  Dustin Flock M.D on 05/26/2015 at 12:55 PM  [facility]   Office  [phone]

## 2015-05-26 NOTE — Discharge Instructions (Signed)

## 2015-06-21 ENCOUNTER — Ambulatory Visit
Admission: EM | Admit: 2015-06-21 | Discharge: 2015-06-21 | Disposition: A | Discharge status: Home / Self Care | Payer: Medicare Other | Attending: Family Medicine | Admitting: Family Medicine

## 2015-06-21 DIAGNOSIS — H9202 Otalgia, left ear: Secondary | ICD-10-CM

## 2015-06-21 DIAGNOSIS — J0100 Acute maxillary sinusitis, unspecified: Secondary | ICD-10-CM

## 2015-06-21 MED ORDER — AZITHROMYCIN 250 MG PO TABS
ORAL_TABLET | ORAL | Status: DC
Start: 2015-06-21 — End: 2016-07-19

## 2015-06-21 NOTE — ED Provider Notes (Signed)
CSN: [id number]     Arrival date & time 06/21/15  1407 History   First MD Initiated Contact with Patient 06/21/15 1601     Chief Complaint  Patient presents with  . Otalgia   (Consider location/radiation/quality/duration/timing/severity/associated sxs/prior Treatment) Patient is a 54 y.o. female presenting with ear pain. The history is provided by the patient.  Otalgia Location:  Bilateral (left greater than right) Quality:  Pressure and throbbing Severity:  Moderate Onset quality:  Gradual Duration:  1 week Timing:  Constant Progression:  Worsening Chronicity:  New Associated symptoms: congestion and headaches   Associated symptoms: no ear discharge     Past Medical History  Diagnosis Date  . Depression   . Suicide attempt ([place])   . Microhematuria   . History of migraine headaches   . GERD (gastroesophageal reflux disease)   . Rhinitis   . Bipolar disorder ([place])   . PTSD (post-traumatic stress disorder)   . Anxiety   . Arthritis     Feet, Hands  . Spinal stenosis of cervical region   . Bulging lumbar disc   . Shortness of breath dyspnea   . Elevated LFTs     having ultrasound [facility] - 12/14/14  . Restless leg syndrome   . Bipolar disorder ([place])   . Asthma     [facility] admission - on Bipap - 7/13 - report on paper chart  . PONV (postoperative nausea and vomiting)   . Motion sickness   . Hypertension     Echo -1/16 - report on paper chart   Past Surgical History  Procedure Laterality Date  . Vaginal hysterectomy    . Tubaligation    . Cholecystectomy    . Foot surgery      multiple  . Septoplasty    . Anterior and posterior repair    . Bladder tact    . Shoulder arthroscopy    . Tonsillectomy    . Cardiac catheterization  2008    [facility] - neg - report in paper chart  . Colonoscopy N/A 01/04/2015    Procedure: COLONOSCOPY;  Surgeon: Lucilla Lame, MD;  Location: [place];  Service: Gastroenterology;  Laterality: N/A;  with biopsies   Family History   Problem Relation Age of Onset  . Asthma Mother   . Cancer Mother   . Diabetes Mother   . Heart attack Father    Social History  Substance Use Topics  . Smoking status: Never Smoker   . Smokeless tobacco: None  . Alcohol Use: No   OB History    No data available     Review of Systems  HENT: Positive for congestion and ear pain. Negative for ear discharge.   Neurological: Positive for headaches.    Allergies  Ciprofloxacin; Amoxicillin; Morphine and related; Pineapple; Sulfa antibiotics; and Tape  Home Medications   Prior to Admission medications   Medication Sig Start Date End Date Taking? Authorizing Provider  albuterol (PROVENTIL HFA;VENTOLIN HFA) 108 (90 BASE) MCG/ACT inhaler Inhale 1-2 puffs into the lungs every 6 (six) hours as needed for wheezing or shortness of breath.    Yes Historical Provider, MD  ARIPiprazole (ABILIFY) 20 MG tablet Take 20 mg by mouth daily.   Yes Historical Provider, MD  clonazePAM (KLONOPIN) 1 MG tablet Take 1 mg by mouth every 12 (twelve) hours.   Yes Historical Provider, MD  lisinopril (PRINIVIL,ZESTRIL) 10 MG tablet Take 10 mg by mouth daily.    Yes Historical Provider, MD  pantoprazole ([place])  40 MG tablet Take 40 mg by mouth daily.    Yes Historical Provider, MD  QUEtiapine (SEROQUEL) 300 MG tablet Take 300 mg by mouth at bedtime.   Yes Historical Provider, MD  rOPINIRole (REQUIP) 3 MG tablet Take 3 mg by mouth at bedtime.   Yes Historical Provider, MD  traMADol (ULTRAM) 50 MG tablet Take 100 mg by mouth 2 (two) times daily.   Yes Historical Provider, MD  valACYclovir (VALTREX) 500 MG tablet Take 500 mg by mouth at bedtime.    Yes Historical Provider, MD  Vitamin D, Ergocalciferol, (DRISDOL) 50000 UNITS CAPS capsule Take 50,000 Units by mouth every 7 (seven) days.   Yes Historical Provider, MD  azithromycin (ZITHROMAX Z-PAK) 250 MG tablet 2 tabs po once day 1, then 1 tab po qd for next 4 days 06/21/15   Norval Gable, MD  guaiFENesin  (MUCINEX) 600 MG 12 hr tablet Take 1 tablet (600 mg total) by mouth 2 (two) times daily. 05/26/15   Dustin Flock, MD  HYDROcodone-acetaminophen (NORCO) 10-325 MG per tablet Take 1 tablet by mouth every 6 (six) hours as needed for severe pain.    Historical Provider, MD  ondansetron (ZOFRAN-ODT) 8 MG disintegrating tablet Take 8 mg by mouth every 8 (eight) hours as needed for nausea or vomiting.    Historical Provider, MD  predniSONE (STERAPRED UNI-PAK 21 TAB) 10 MG (21) TBPK tablet Take 1 tablet (10 mg total) by mouth daily. Label  & dispense according to the schedule below.  5tablets day one, then 4 tablets day 2, then 3 tablets day 3, then2 tablets day 4, 1 tablets day 5 05/26/15   Dustin Flock, MD  rizatriptan (MAXALT-MLT) 10 MG disintegrating tablet Take 10 mg by mouth as needed for migraine. May repeat in 2 hours if needed    Historical Provider, MD   Meds Ordered and Administered this Visit  Medications - No data to display  BP 111/64 mmHg  Pulse 97  Temp(Src) 97.6 F (36.4 C) (Tympanic)  Resp 18  Ht 5\' 1"  (1.549 m)  Wt 215 lb (97.523 kg)  BMI 40.64 kg/m2  SpO2 99% No data found.   Physical Exam  Constitutional: She appears well-developed and well-nourished. No distress.  HENT:  Head: Normocephalic and atraumatic.  Right Ear: Tympanic membrane, external ear and ear canal normal.  Left Ear: External ear and ear canal normal. A middle ear effusion is present.  Nose: Mucosal edema and rhinorrhea present. No nose lacerations, sinus tenderness, nasal deformity, septal deviation or nasal septal hematoma. No epistaxis.  No foreign bodies. Right sinus exhibits maxillary sinus tenderness and frontal sinus tenderness. Left sinus exhibits maxillary sinus tenderness and frontal sinus tenderness.  Mouth/Throat: Uvula is midline, oropharynx is clear and moist and mucous membranes are normal. No oropharyngeal exudate.  Eyes: Conjunctivae and EOM are normal. Pupils are equal, round, and  reactive to light. Right eye exhibits no discharge. Left eye exhibits no discharge. No scleral icterus.  Neck: Normal range of motion. Neck supple. No thyromegaly present.  Cardiovascular: Normal rate, regular rhythm and normal heart sounds.   Pulmonary/Chest: Effort normal and breath sounds normal. No respiratory distress. She has no wheezes. She has no rales.  Lymphadenopathy:    She has no cervical adenopathy.  Skin: She is not diaphoretic.  Nursing note and vitals reviewed.   ED Course  Procedures (including critical care time)  Labs Review Labs Reviewed - No data to display  Imaging Review No results found.   Visual  Acuity Review  Right Eye Distance:   Left Eye Distance:   Bilateral Distance:    Right Eye Near:   Left Eye Near:    Bilateral Near:         MDM   1. Acute maxillary sinusitis, recurrence not specified   2. Otalgia, left   (eustachian tube dysfunction?)   Discharge Medication List as of 06/21/2015  4:45 PM     1. diagnosis reviewed with patient 2. rx as per orders above; reviewed possible side effects, interactions, risks and benefits  3. Recommend supportive treatment with otc analgesics, anti-histamine and decongestants   4. Follow-up prn if symptoms worsen or don't improve    Norval Gable, MD 06/21/15 1751

## 2015-06-21 NOTE — ED Notes (Signed)
C/o left ear pain since last weekend. Saw PMD who said "had fluid" in both ears, left worse than right. When blows nose "I feel stuff blowing through into my throat"

## 2015-10-02 DIAGNOSIS — D352 Benign neoplasm of pituitary gland: Secondary | ICD-10-CM | POA: Insufficient documentation

## 2015-12-06 ENCOUNTER — Telehealth: Payer: Self-pay | Admitting: Gastroenterology

## 2015-12-06 NOTE — Telephone Encounter (Signed)
Patient called and wanted to get an earlier appointment with Dr. Allen Norris. I told her what you said about checking with her PCP. She said she would call them to see if she could get in there. She would like to talk to you. She stated she has been so nauseated everyday.

## 2015-12-09 NOTE — Telephone Encounter (Signed)
Pt has made an appt with PCP due to appt with Dr. Allen Norris on 12/26/15. Pt will contact me if she needs anything else.

## 2015-12-16 ENCOUNTER — Other Ambulatory Visit: Payer: Self-pay

## 2015-12-16 DIAGNOSIS — R112 Nausea with vomiting, unspecified: Secondary | ICD-10-CM

## 2015-12-17 ENCOUNTER — Telehealth: Payer: Self-pay | Admitting: Gastroenterology

## 2015-12-17 NOTE — Telephone Encounter (Signed)
Pt scheduled for a gastric emptying study at [facility] on Monday, June 19th @ 9:00am. Pt has been notified of date, time location and prep instructions. Pt has been told to stop her Nexium and Zofran 24 hrs prior to her appt. Pt verbalized understanding.

## 2015-12-17 NOTE — Telephone Encounter (Signed)
Patient called and wanted to know if we got authorization for her procedure yet? Please call

## 2015-12-23 ENCOUNTER — Ambulatory Visit
Admission: RE | Admit: 2015-12-23 | Discharge: 2015-12-23 | Disposition: A | Discharge status: Home / Self Care | Payer: Medicare Other | Source: Ambulatory Visit | Attending: Gastroenterology | Admitting: Gastroenterology

## 2015-12-23 DIAGNOSIS — R112 Nausea with vomiting, unspecified: Secondary | ICD-10-CM

## 2015-12-26 ENCOUNTER — Ambulatory Visit: Payer: Medicare Other | Admitting: Gastroenterology

## 2015-12-26 NOTE — Discharge Instructions (Signed)

## 2015-12-30 ENCOUNTER — Ambulatory Visit
Admission: RE | Admit: 2015-12-30 | Discharge: 2015-12-30 | Disposition: A | Discharge status: Home / Self Care | Payer: Medicare Other | Source: Ambulatory Visit | Attending: Gastroenterology | Admitting: Gastroenterology

## 2015-12-30 ENCOUNTER — Ambulatory Visit: Payer: Medicare Other | Admitting: Anesthesiology

## 2015-12-30 ENCOUNTER — Encounter
Admission: RE | Disposition: A | Discharge status: Home / Self Care | Payer: Self-pay | Source: Ambulatory Visit | Attending: Gastroenterology

## 2015-12-30 DIAGNOSIS — G2581 Restless legs syndrome: Secondary | ICD-10-CM | POA: Insufficient documentation

## 2015-12-30 DIAGNOSIS — K449 Diaphragmatic hernia without obstruction or gangrene: Secondary | ICD-10-CM | POA: Insufficient documentation

## 2015-12-30 DIAGNOSIS — R011 Cardiac murmur, unspecified: Secondary | ICD-10-CM | POA: Diagnosis not present

## 2015-12-30 DIAGNOSIS — Z8541 Personal history of malignant neoplasm of cervix uteri: Secondary | ICD-10-CM | POA: Diagnosis not present

## 2015-12-30 DIAGNOSIS — M4802 Spinal stenosis, cervical region: Secondary | ICD-10-CM | POA: Insufficient documentation

## 2015-12-30 DIAGNOSIS — F4310 Post-traumatic stress disorder, unspecified: Secondary | ICD-10-CM | POA: Diagnosis not present

## 2015-12-30 DIAGNOSIS — F419 Anxiety disorder, unspecified: Secondary | ICD-10-CM | POA: Insufficient documentation

## 2015-12-30 DIAGNOSIS — K3189 Other diseases of stomach and duodenum: Secondary | ICD-10-CM | POA: Diagnosis not present

## 2015-12-30 DIAGNOSIS — M1990 Unspecified osteoarthritis, unspecified site: Secondary | ICD-10-CM | POA: Diagnosis not present

## 2015-12-30 DIAGNOSIS — F329 Major depressive disorder, single episode, unspecified: Secondary | ICD-10-CM | POA: Diagnosis not present

## 2015-12-30 DIAGNOSIS — Z6839 Body mass index (BMI) 39.0-39.9, adult: Secondary | ICD-10-CM | POA: Insufficient documentation

## 2015-12-30 DIAGNOSIS — J45909 Unspecified asthma, uncomplicated: Secondary | ICD-10-CM | POA: Diagnosis not present

## 2015-12-30 DIAGNOSIS — G43909 Migraine, unspecified, not intractable, without status migrainosus: Secondary | ICD-10-CM | POA: Insufficient documentation

## 2015-12-30 DIAGNOSIS — Z79899 Other long term (current) drug therapy: Secondary | ICD-10-CM | POA: Insufficient documentation

## 2015-12-30 DIAGNOSIS — I10 Essential (primary) hypertension: Secondary | ICD-10-CM | POA: Diagnosis not present

## 2015-12-30 DIAGNOSIS — R112 Nausea with vomiting, unspecified: Secondary | ICD-10-CM | POA: Diagnosis present

## 2015-12-30 DIAGNOSIS — K2970 Gastritis, unspecified, without bleeding: Secondary | ICD-10-CM | POA: Diagnosis not present

## 2015-12-30 DIAGNOSIS — K219 Gastro-esophageal reflux disease without esophagitis: Secondary | ICD-10-CM | POA: Diagnosis not present

## 2015-12-30 DIAGNOSIS — E669 Obesity, unspecified: Secondary | ICD-10-CM | POA: Insufficient documentation

## 2015-12-30 HISTORY — DX: Pneumonia, unspecified organism: J18.9

## 2015-12-30 HISTORY — PX: ESOPHAGOGASTRODUODENOSCOPY (EGD) WITH PROPOFOL: SHX5813

## 2015-12-30 HISTORY — DX: Dizziness and giddiness: R42

## 2015-12-30 HISTORY — DX: Headache, unspecified: R51.9

## 2015-12-30 HISTORY — DX: Cardiac murmur, unspecified: R01.1

## 2015-12-30 HISTORY — DX: Headache: R51

## 2015-12-30 SURGERY — ESOPHAGOGASTRODUODENOSCOPY (EGD) WITH PROPOFOL
Anesthesia: Monitor Anesthesia Care | Wound class: Clean Contaminated

## 2015-12-30 MED ORDER — SIMETHICONE 40 MG/0.6ML PO SUSP
ORAL | Status: DC | PRN
  Administered 2015-12-30: 12:00:00

## 2015-12-30 MED ORDER — LIDOCAINE HCL (CARDIAC) 20 MG/ML IV SOLN
INTRAVENOUS | Status: DC | PRN
  Administered 2015-12-30: 50 mg via INTRAVENOUS

## 2015-12-30 MED ORDER — GLYCOPYRROLATE 0.2 MG/ML IJ SOLN
INTRAMUSCULAR | Status: DC | PRN
  Administered 2015-12-30: 0.1 mg via INTRAVENOUS

## 2015-12-30 MED ORDER — PROPOFOL 10 MG/ML IV BOLUS
INTRAVENOUS | Status: DC | PRN
  Administered 2015-12-30: 120 mg via INTRAVENOUS
  Administered 2015-12-30: 10 mg via INTRAVENOUS
  Administered 2015-12-30: 40 mg via INTRAVENOUS

## 2015-12-30 MED ORDER — LACTATED RINGERS IV SOLN
INTRAVENOUS | Status: DC
  Administered 2015-12-30: 12:00:00 via INTRAVENOUS

## 2015-12-30 SURGICAL SUPPLY — 32 items
BALLN DILATOR 10-12 8 (BALLOONS)
BALLN DILATOR 12-15 8 (BALLOONS)
BALLN DILATOR 15-18 8 (BALLOONS)
BALLN DILATOR CRE 0-12 8 (BALLOONS)
BALLN DILATOR ESOPH 8 10 CRE (MISCELLANEOUS) IMPLANT
BALLOON DILATOR 12-15 8 (BALLOONS) IMPLANT
BALLOON DILATOR 15-18 8 (BALLOONS) IMPLANT
BALLOON DILATOR CRE 0-12 8 (BALLOONS) IMPLANT
BLOCK BITE 60FR ADLT L/F GRN (MISCELLANEOUS) ×2 IMPLANT
CANISTER SUCT 1200ML W/VALVE (MISCELLANEOUS) ×2 IMPLANT
CLIP HMST 235XBRD CATH ROT (MISCELLANEOUS) IMPLANT
CLIP RESOLUTION 360 11X235 (MISCELLANEOUS)
FCP ESCP3.2XJMB 240X2.8X (MISCELLANEOUS)
FORCEPS BIOP RAD 4 LRG CAP 4 (CUTTING FORCEPS) ×1 IMPLANT
FORCEPS BIOP RJ4 240 W/NDL (MISCELLANEOUS)
FORCEPS ESCP3.2XJMB 240X2.8X (MISCELLANEOUS) IMPLANT
GOWN CVR UNV OPN BCK APRN NK (MISCELLANEOUS) ×2 IMPLANT
GOWN ISOL THUMB LOOP REG UNIV (MISCELLANEOUS) ×4
INJECTOR VARIJECT VIN23 (MISCELLANEOUS) IMPLANT
KIT DEFENDO VALVE AND CONN (KITS) IMPLANT
KIT ENDO PROCEDURE OLY (KITS) ×2 IMPLANT
MARKER SPOT ENDO TATTOO 5ML (MISCELLANEOUS) IMPLANT
PAD GROUND ADULT SPLIT (MISCELLANEOUS) IMPLANT
RETRIEVER NET PLAT FOOD (MISCELLANEOUS) IMPLANT
SNARE SHORT THROW 13M SML OVAL (MISCELLANEOUS) IMPLANT
SNARE SHORT THROW 30M LRG OVAL (MISCELLANEOUS) IMPLANT
SPOT EX ENDOSCOPIC TATTOO (MISCELLANEOUS)
SYR INFLATION 60ML (SYRINGE) IMPLANT
TRAP ETRAP POLY (MISCELLANEOUS) IMPLANT
VARIJECT INJECTOR VIN23 (MISCELLANEOUS)
WATER STERILE IRR 250ML POUR (IV SOLUTION) ×2 IMPLANT
WIRE CRE 18-20MM 8CM F G (MISCELLANEOUS) IMPLANT

## 2015-12-30 NOTE — Anesthesia Procedure Notes (Signed)
Procedure Name: MAC Date/Time: 12/30/2015 11:49 AM Performed by: Cameron Ali Pre-anesthesia Checklist: Patient identified, Emergency Drugs available, Suction available, Timeout performed and Patient being monitored Patient Re-evaluated:Patient Re-evaluated prior to inductionOxygen Delivery Method: Nasal cannula Placement Confirmation: positive ETCO2

## 2015-12-30 NOTE — Transfer of Care (Signed)
Immediate Anesthesia Transfer of Care Note  Patient: Brandi Bell  Procedure(s) Performed: Procedure(s): ESOPHAGOGASTRODUODENOSCOPY (EGD) WITH PROPOFOL (N/A)  Patient Location: PACU  Anesthesia Type: MAC  Level of Consciousness: awake, alert  and patient cooperative  Airway and Oxygen Therapy: Patient Spontanous Breathing and Patient connected to supplemental oxygen  Post-op Assessment: Post-op Vital signs reviewed, Patient's Cardiovascular Status Stable, Respiratory Function Stable, Patent Airway and No signs of Nausea or vomiting  Post-op Vital Signs: Reviewed and stable  Complications: No apparent anesthesia complications

## 2015-12-30 NOTE — Anesthesia Preprocedure Evaluation (Addendum)
Anesthesia Evaluation  Patient identified by MRN, date of birth, ID band  Reviewed: NPO status   History of Anesthesia Complications Negative for: history of anesthetic complications  Airway Mallampati: II  TM Distance: >3 FB Neck ROM: full    Dental no notable dental hx.    Pulmonary asthma ,    Pulmonary exam normal        Cardiovascular Exercise Tolerance: Good hypertension, Normal cardiovascular exam  Hx Atypical chest pain > neg. Cardiac workup.   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     Plan Discussed with: CRNA  Anesthesia Plan Comments:         Anesthesia Quick Evaluation

## 2015-12-30 NOTE — Op Note (Signed)
[facility] Gastroenterology Patient Name: Brandi Bell Procedure Date: 12/30/2015 11:46 AM MRN: [id number] Account #: [id number] Date of Birth: 02-Jul-1961 Admit Type: Outpatient Age: 55 Room: [facility] OR ROOM 01 Gender: Female Note Status: Finalized Procedure:            Upper GI endoscopy Indications:          Nausea with vomiting Providers:            Lucilla Lame, MD Medicines:            Propofol per Anesthesia Complications:        No immediate complications. Procedure:            Pre-Anesthesia Assessment:                       - Prior to the procedure, a History and Physical was                        performed, and patient medications and allergies were                        reviewed. The patient's tolerance of previous                        anesthesia was also reviewed. The risks and benefits of                        the procedure and the sedation options and risks were                        discussed with the patient. All questions were                        answered, and informed consent was obtained. Prior                        Anticoagulants: The patient has taken no previous                        anticoagulant or antiplatelet agents. ASA Grade                        Assessment: II - A patient with mild systemic disease.                        After reviewing the risks and benefits, the patient was                        deemed in satisfactory condition to undergo the                        procedure.                       After obtaining informed consent, the endoscope was                        passed under direct vision. Throughout the procedure,                        the patient's blood pressure, pulse,  and oxygen                        saturations were monitored continuously. The was                        introduced through the mouth, and advanced to the                        second part of duodenum. The upper GI endoscopy was              accomplished without difficulty. The patient tolerated                        the procedure well. Findings:      A small hiatal hernia was present.      Localized mild inflammation characterized by erythema was found in the       gastric antrum. Biopsies were taken with a cold forceps for histology.      The examined duodenum was normal. Impression:           - Small hiatal hernia.                       - Gastritis. Biopsied.                       - Normal examined duodenum. Recommendation:       - Await pathology results. Procedure Code(s):    --- Professional ---                       [id number], Esophagogastroduodenoscopy, flexible, transoral;                        with biopsy, single or multiple Diagnosis Code(s):    --- Professional ---                       R11.2, Nausea with vomiting, unspecified                       K44.9, Diaphragmatic hernia without obstruction or                        gangrene                       K29.70, Gastritis, unspecified, without bleeding CPT copyright 2016 American Medical Association. All rights reserved. The codes documented in this report are preliminary and upon coder review may  be revised to meet current compliance requirements. Lucilla Lame, MD 12/30/2015 11:57:49 AM This report has been signed electronically. Number of Addenda: 0 Note Initiated On: 12/30/2015 11:46 AM      [facility]

## 2015-12-30 NOTE — H&P (Signed)
Brandi Lame, MD [facility] [address] [place], [place] [address] Phone: [phone] Fax : [phone]  Primary Care Physician:  Nydia Bouton, MD Primary Gastroenterologist:  Dr. Allen Norris  Pre-Procedure History & Physical: HPI:  Brandi Bell is a 55 y.o. female is here for an endoscopy.   Past Medical History  Diagnosis Date  . Depression   . Suicide attempt ([place])   . Microhematuria   . History of migraine headaches   . GERD (gastroesophageal reflux disease)   . Rhinitis   . Bipolar disorder ([place])   . PTSD (post-traumatic stress disorder)   . Anxiety   . Spinal stenosis of cervical region   . Bulging lumbar disc   . Shortness of breath dyspnea   . Elevated LFTs     having ultrasound [facility] - 12/14/14  . Restless leg syndrome   . Bipolar disorder ([place])   . PONV (postoperative nausea and vomiting)   . Motion sickness   . Hypertension     Echo -1/16 - report on paper chart/controlled on meds  . 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 Asthma     [facility] admission - on Bipap - 7/13 - report on paper chart/ flare 3 mos ago  . Pneumonia     hx of  . Headache     migraines, every three or 4 days/ stress related  . Arthritis     osteo Back, Feet, Hands  . Vertigo 6 yrs ago    hx of    Past Surgical History  Procedure Laterality Date  . Vaginal hysterectomy    . Tubaligation    . Cholecystectomy    . Foot surgery      multiple  . Septoplasty    . Anterior and posterior repair      done with bladder tact  . Bladder tact    . Shoulder arthroscopy    . Tonsillectomy    . Cardiac catheterization  2008    [facility] - neg - report in paper chart  . Colonoscopy N/A 01/04/2015    Procedure: COLONOSCOPY;  Surgeon: Brandi Lame, MD;  Location: [place];  Service: Gastroenterology;  Laterality: N/A;  with biopsies  . Cataract extraction Bilateral 2007    Prior to Admission medications   Medication Sig Start Date End Date Taking?  Authorizing Provider  albuterol (PROVENTIL HFA;VENTOLIN HFA) 108 (90 BASE) MCG/ACT inhaler Inhale 1-2 puffs into the lungs every 6 (six) hours as needed for wheezing or shortness of breath.    Yes Historical Provider, MD  ARIPiprazole (ABILIFY) 20 MG tablet Take 30 mg by mouth daily. 8am   Yes Historical Provider, MD  clonazePAM (KLONOPIN) 1 MG tablet Take 0.5 mg by mouth 2 (two) times daily. Am and pm   Yes Historical Provider, MD  esomeprazole (NEXIUM) 20 MG capsule Take 20 mg by mouth daily at 12 noon. am   Yes Historical Provider, MD  Levomilnacipran HCl ER (FETZIMA) 20 MG CP24 Take by mouth daily. am   Yes Historical Provider, MD  lisinopril (PRINIVIL,ZESTRIL) 10 MG tablet Take 10 mg by mouth daily. am   Yes Historical Provider, MD  ondansetron (ZOFRAN-ODT) 8 MG disintegrating tablet Take 8 mg by mouth every 8 (eight) hours as needed for nausea or vomiting.   Yes Historical Provider, MD  OXcarbazepine (TRILEPTAL) 150 MG tablet Take 150 mg by mouth 2 (two) times daily. 8am and 8pm   Yes Historical  Provider, MD  rizatriptan (MAXALT-MLT) 10 MG disintegrating tablet Take 10 mg by mouth as needed for migraine. May repeat in 2 hours if needed   Yes Historical Provider, MD  rOPINIRole (REQUIP) 3 MG tablet Take 3 mg by mouth at bedtime.   Yes Historical Provider, MD  traMADol (ULTRAM) 50 MG tablet Take 100 mg by mouth at bedtime.    Yes Historical Provider, MD  valACYclovir (VALTREX) 500 MG tablet Take 500 mg by mouth at bedtime.    Yes Historical Provider, MD  azithromycin (ZITHROMAX Z-PAK) 250 MG tablet 2 tabs po once day 1, then 1 tab po qd for next 4 days Patient not taking: Reported on 12/25/2015 06/21/15   Norval Gable, MD  HYDROcodone-acetaminophen (NORCO) 10-325 MG per tablet Take 1 tablet by mouth every 6 (six) hours as needed for severe pain. Reported on 12/25/2015    Historical Provider, MD    Allergies as of 12/16/2015 - Review Complete 06/21/2015  Allergen Reaction Noted  . Ciprofloxacin  Anaphylaxis 05/23/2012  . Amoxicillin Hives 05/23/2012  . Morphine and related Nausea And Vomiting 05/23/2012  . Pineapple Other (See Comments) 12/13/2014  . Sulfa antibiotics Hives 12/07/2014  . Tape Other (See Comments) 12/13/2014    Family History  Problem Relation Age of Onset  . Asthma Mother   . Cancer Mother   . Diabetes Mother   . Heart attack Father     Social History   Social History  . Marital Status: Divorced    Spouse Name: N/A  . Number of Children: N/A  . Years of Education: N/A   Occupational History  . Not on file.   Social History Main Topics  . Smoking status: Never Smoker   . Smokeless tobacco: Never Used  . Alcohol Use: No  . Drug Use: No  . Sexual Activity: No   Other Topics Concern  . Not on file   Social History Narrative    Review of Systems: See HPI, otherwise negative ROS  Physical Exam: Ht 5\' 1"  (1.549 m)  Wt 208 lb (94.348 kg)  BMI 39.32 kg/m2 General:   Alert,  pleasant and cooperative in NAD Head:  Normocephalic and atraumatic. Neck:  Supple; no masses or thyromegaly. Lungs:  Clear throughout to auscultation.    Heart:  Regular rate and rhythm. Abdomen:  Soft, nontender and nondistended. Normal bowel sounds, without guarding, and without rebound.   Neurologic:  Alert and  oriented x4;  grossly normal neurologically.  Impression/Plan: Brandi Bell is here for an endoscopy to be performed for nausea and vomiting.  Risks, benefits, limitations, and alternatives regarding  endoscopy have been reviewed with the patient.  Questions have been answered.  All parties agreeable.   Brandi Lame, MD  12/30/2015, 11:19 AM

## 2015-12-30 NOTE — Anesthesia Postprocedure Evaluation (Signed)
Anesthesia Post Note  Patient: Brandi Bell  Procedure(s) Performed: Procedure(s) (LRB): ESOPHAGOGASTRODUODENOSCOPY (EGD) WITH PROPOFOL (N/A)  Patient location during evaluation: PACU Anesthesia Type: MAC Level of consciousness: awake and alert Pain management: pain level controlled Vital Signs Assessment: post-procedure vital signs reviewed and stable Respiratory status: spontaneous breathing, nonlabored ventilation, respiratory function stable and patient connected to nasal cannula oxygen Cardiovascular status: stable and blood pressure returned to baseline Anesthetic complications: no    Nitish Roes

## 2015-12-31 ENCOUNTER — Encounter: Payer: Self-pay | Admitting: Gastroenterology

## 2016-01-07 ENCOUNTER — Encounter: Payer: Self-pay | Admitting: Gastroenterology

## 2016-01-08 ENCOUNTER — Ambulatory Visit: Admission: RE | Admit: 2016-01-08 | Payer: Medicare Other | Source: Ambulatory Visit

## 2016-01-13 ENCOUNTER — Other Ambulatory Visit: Payer: Self-pay

## 2016-01-13 ENCOUNTER — Telehealth: Payer: Self-pay | Admitting: Gastroenterology

## 2016-01-13 DIAGNOSIS — R112 Nausea with vomiting, unspecified: Secondary | ICD-10-CM

## 2016-01-13 MED ORDER — ESOMEPRAZOLE MAGNESIUM 40 MG PO CPDR: 40.0000 mg | DELAYED_RELEASE_CAPSULE | Freq: Every day | ORAL | Status: DC

## 2016-01-13 NOTE — Telephone Encounter (Signed)
What should she do about the gastritis and has a question regarding a test she needs to have

## 2016-01-13 NOTE — Telephone Encounter (Signed)
Pt advised to increase her Nexium to 40mg  for her gastritis dx from recent EGD. Also, pt requested her order for the gastric emptying study be sent to [facility] in [place]. Order faxed to [phone].

## 2016-01-24 ENCOUNTER — Telehealth: Payer: Self-pay | Admitting: Gastroenterology

## 2016-01-24 NOTE — Telephone Encounter (Signed)
Took test this morning at [facility]. Will you call her when you get the results?

## 2016-01-24 NOTE — Telephone Encounter (Signed)
Will call pt once we receive results and Dr. Allen Norris reviews.

## 2016-01-29 ENCOUNTER — Encounter: Payer: Self-pay | Admitting: Gastroenterology

## 2016-01-30 ENCOUNTER — Other Ambulatory Visit: Payer: Self-pay

## 2016-01-30 MED ORDER — ONDANSETRON 8 MG PO TBDP: 8.0000 mg | ORAL_TABLET | Freq: Three times a day (TID) | ORAL | 3 refills | Status: DC | PRN

## 2016-01-31 ENCOUNTER — Encounter: Payer: Self-pay | Admitting: Gastroenterology

## 2016-03-17 DIAGNOSIS — R7303 Prediabetes: Secondary | ICD-10-CM | POA: Insufficient documentation

## 2016-03-17 DIAGNOSIS — M7060 Trochanteric bursitis, unspecified hip: Secondary | ICD-10-CM | POA: Insufficient documentation

## 2016-06-01 ENCOUNTER — Other Ambulatory Visit: Payer: Self-pay

## 2016-06-01 ENCOUNTER — Telehealth: Payer: Self-pay | Admitting: Gastroenterology

## 2016-06-01 MED ORDER — ONDANSETRON 8 MG PO TBDP: 8.0000 mg | ORAL_TABLET | Freq: Three times a day (TID) | ORAL | 1 refills | Status: DC | PRN

## 2016-06-01 MED ORDER — ESOMEPRAZOLE MAGNESIUM 40 MG PO CPDR: 40.0000 mg | DELAYED_RELEASE_CAPSULE | Freq: Every day | ORAL | 3 refills | Status: DC

## 2016-06-01 NOTE — Telephone Encounter (Signed)
Medication has been sent to [facility].

## 2016-06-01 NOTE — Telephone Encounter (Signed)
Patient called and stated that she is going to mail order now. She needs Nextium and Zofran ODT called in. [facility] [phone]

## 2016-06-03 ENCOUNTER — Telehealth: Payer: Self-pay | Admitting: Gastroenterology

## 2016-06-03 NOTE — Telephone Encounter (Signed)
Patient would like to talk to you regarding constipation.

## 2016-06-03 NOTE — Telephone Encounter (Signed)
Pt stated she was taking iron supplements and this has made her constipated. She was told to add miralax daily She stated 1 packet wasn't helping so she added 2 packets. She then stated that was too much and it gave her extreme diarrhea. Pt was advised to cut the miralax to 1 1/2 packets and see if that will work better and if this is still too much then reduce to 1 packet daily. Also, advised her to add lots of water as well.

## 2016-06-10 ENCOUNTER — Telehealth: Payer: Self-pay | Admitting: Gastroenterology

## 2016-06-10 NOTE — Telephone Encounter (Signed)
[facility] needs an authorization for the Zofran.

## 2016-06-15 ENCOUNTER — Encounter: Payer: Self-pay | Admitting: Gastroenterology

## 2016-06-16 NOTE — Telephone Encounter (Signed)
Patient called this morning and was wondering if you talked to Dr. Allen Norris about the mediation yesterday.

## 2016-07-01 DIAGNOSIS — M810 Age-related osteoporosis without current pathological fracture: Secondary | ICD-10-CM | POA: Insufficient documentation

## 2016-07-01 DIAGNOSIS — G2581 Restless legs syndrome: Secondary | ICD-10-CM | POA: Insufficient documentation

## 2016-07-07 ENCOUNTER — Ambulatory Visit: Payer: Medicare Other | Admitting: Gastroenterology

## 2016-07-18 ENCOUNTER — Encounter: Payer: Self-pay | Admitting: Internal Medicine

## 2016-07-19 ENCOUNTER — Encounter: Payer: Self-pay | Admitting: Internal Medicine

## 2016-07-19 ENCOUNTER — Other Ambulatory Visit: Payer: Self-pay | Admitting: Internal Medicine

## 2016-07-19 DIAGNOSIS — G43909 Migraine, unspecified, not intractable, without status migrainosus: Secondary | ICD-10-CM | POA: Insufficient documentation

## 2016-07-19 DIAGNOSIS — Z9151 Personal history of suicidal behavior: Secondary | ICD-10-CM | POA: Insufficient documentation

## 2016-07-19 DIAGNOSIS — F4310 Post-traumatic stress disorder, unspecified: Secondary | ICD-10-CM | POA: Insufficient documentation

## 2016-07-19 DIAGNOSIS — Z915 Personal history of self-harm: Secondary | ICD-10-CM | POA: Insufficient documentation

## 2016-07-21 ENCOUNTER — Ambulatory Visit (INDEPENDENT_AMBULATORY_CARE_PROVIDER_SITE_OTHER): Payer: Medicare Other | Admitting: Internal Medicine

## 2016-07-21 ENCOUNTER — Encounter: Payer: Self-pay | Admitting: Internal Medicine

## 2016-07-21 VITALS — BP 108/60 | HR 109 | Temp 97.6°F | Ht 61.0 in | Wt 222.0 lb

## 2016-07-21 DIAGNOSIS — N649 Disorder of breast, unspecified: Secondary | ICD-10-CM | POA: Diagnosis not present

## 2016-07-21 DIAGNOSIS — E559 Vitamin D deficiency, unspecified: Secondary | ICD-10-CM | POA: Insufficient documentation

## 2016-07-21 DIAGNOSIS — J4520 Mild intermittent asthma, uncomplicated: Secondary | ICD-10-CM

## 2016-07-21 DIAGNOSIS — L988 Other specified disorders of the skin and subcutaneous tissue: Secondary | ICD-10-CM

## 2016-07-21 DIAGNOSIS — G43009 Migraine without aura, not intractable, without status migrainosus: Secondary | ICD-10-CM

## 2016-07-21 DIAGNOSIS — M818 Other osteoporosis without current pathological fracture: Secondary | ICD-10-CM

## 2016-07-21 DIAGNOSIS — I10 Essential (primary) hypertension: Secondary | ICD-10-CM

## 2016-07-21 NOTE — Progress Notes (Signed)
Date:  07/21/2016   Name:  Brandi Bell   DOB:  1961/06/13   MRN:  [id number]  New patient here to establish care. Chief Complaint: Establish Care and Open Wound Hypertension  This is a chronic problem. The current episode started more than 1 year ago. The problem is controlled. Pertinent negatives include no chest pain, headaches, palpitations or shortness of breath.  Migraine   Associated symptoms include coughing. Pertinent negatives include no abdominal pain, fever, numbness or tinnitus. Her past medical history is significant for hypertension.  Asthma  She complains of chest tightness and cough. There is no shortness of breath. This is a recurrent problem. The current episode started more than 1 year ago. The problem occurs intermittently. The cough is dry. Associated symptoms include heartburn. Pertinent negatives include no appetite change, chest pain, fever, headaches or trouble swallowing. Her symptoms are not alleviated by beta-agonist. Her past medical history is significant for asthma.  Hyperlipidemia  This is a chronic problem. Pertinent negatives include no chest pain or shortness of breath. She is currently on no antihyperlipidemic treatment.  Wound Check  She was originally treated 10 to 14 days ago. Her temperature was unmeasured prior to arrival. There has been no drainage from the wound. There is new redness present. There is new swelling present. The pain has worsened.  Gastroesophageal Reflux  She complains of coughing and heartburn. She reports no abdominal pain or no chest pain. This is a recurrent problem. The problem has been gradually improving. Pertinent negatives include no fatigue. She has tried a PPI for the symptoms. The treatment provided significant relief.  Osteoporosis - new dx and started Fosamax weekly with Dr. Nicole Kindred.  Vitamin D was low so started on supplement.    Review of Systems  Constitutional: Negative for appetite change, fatigue, fever and  unexpected weight change.  HENT: Negative for tinnitus and trouble swallowing.   Eyes: Negative for visual disturbance.  Respiratory: Positive for cough. Negative for chest tightness and shortness of breath.   Cardiovascular: Negative for chest pain, palpitations and leg swelling.  Gastrointestinal: Positive for heartburn. Negative for abdominal pain and blood in stool.  Endocrine: Negative for polydipsia and polyuria.  Genitourinary: Negative for dysuria and hematuria.  Musculoskeletal: Negative for arthralgias.  Skin: Positive for wound.  Neurological: Negative for tremors, numbness and headaches.  Psychiatric/Behavioral: Negative for dysphoric mood, hallucinations and sleep disturbance.    Patient Active Problem List   Diagnosis Date Noted  . Suicide attempt 07/19/2016  . PTSD (post-traumatic stress disorder) 07/19/2016  . Migraine headache 07/19/2016  . RLS (restless legs syndrome) 07/01/2016  . Osteoporosis 07/01/2016  . Trochanteric bursitis 03/17/2016  . Prediabetes 03/17/2016  . Gastritis   . Pituitary adenoma ([place]) 10/02/2015  . Borderline personality disorder 02/10/2015  . Asthma 02/10/2015  . HTN (hypertension) 02/10/2015  . Chronic pain syndrome 02/10/2015  . Arthritis 02/10/2015  . Hx of colonic polyps   . Seasonal allergies 12/21/2013  . Fibromyalgia 12/21/2013  . Moderate persistent asthma without complication 123XX123  . Chronic nausea 06/20/2013  . Bipolar disorder ([place]) 06/20/2013  . Malignant neoplasm of cervix ([place]) 07/20/2012  . Lumbar herniated disc 07/20/2012  . Hyperlipidemia 07/20/2012  . 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 Breast mass 07/20/2012    Prior to Admission medications   Medication Sig Start Date End Date Taking? Authorizing Provider  albuterol (PROVENTIL HFA;VENTOLIN HFA) 108 (90 BASE) MCG/ACT inhaler Inhale 1-2 puffs into the lungs every 6 (  six) hours as needed for wheezing or shortness of breath.     Historical  Provider, MD  alendronate (FOSAMAX) 70 MG tablet Take 70 mg by mouth once a week.    Historical Provider, MD  ARIPiprazole (ABILIFY) 20 MG tablet Take 30 mg by mouth daily. 8am    Historical Provider, MD  budesonide-formoterol (SYMBICORT) 160-4.5 MCG/ACT inhaler Inhale 2 puffs into the lungs 2 (two) times daily. 04/02/16 04/02/17  Historical Provider, MD  calcium carbonate (CALCIUM 600) 600 MG TABS tablet Take 600 mg by mouth 2 (two) times daily.    Historical Provider, MD  Cholecalciferol (VITAMIN D3) 5000 units TABS Take 5,000 Units by mouth daily.    Historical Provider, MD  clonazePAM (KLONOPIN) 1 MG tablet Take 0.5 mg by mouth 2 (two) times daily. Am and pm    Historical Provider, MD  esomeprazole (NEXIUM) 40 MG capsule Take 1 capsule (40 mg total) by mouth daily before breakfast. 06/01/16   Lucilla Lame, MD  fluticasone (FLONASE) 50 MCG/ACT nasal spray Place into the nose. 04/02/16   Historical Provider, MD  gabapentin (NEURONTIN) 100 MG capsule Take 600 mg by mouth.    Historical Provider, MD  iloperidone (FANAPT) 4 MG TABS tablet Take 4 mg by mouth.    Historical Provider, MD  ipratropium-albuterol (DUONEB) 0.5-2.5 (3) MG/3ML SOLN Inhale 3 mL by nebulization every six (6) hours as needed. 07/03/16 07/03/17  Historical Provider, MD  Levomilnacipran HCl ER (FETZIMA) 20 MG CP24 Take by mouth daily. am    Historical Provider, MD  losartan (COZAAR) 50 MG tablet Take 50 mg by mouth daily.    Historical Provider, MD  ondansetron (ZOFRAN-ODT) 8 MG disintegrating tablet Take 1 tablet (8 mg total) by mouth every 8 (eight) hours as needed for nausea or vomiting. 06/01/16   Lucilla Lame, MD  OXcarbazepine (TRILEPTAL) 150 MG tablet Take 150 mg by mouth 2 (two) times daily. 8am and 8pm    Historical Provider, MD  prazosin (MINIPRESS) 1 MG capsule Take 1 mg by mouth at bedtime.    Historical Provider, MD  rizatriptan (MAXALT-MLT) 10 MG disintegrating tablet Take 10 mg by mouth as needed for migraine. May repeat  in 2 hours if needed    Historical Provider, MD  rOPINIRole (REQUIP) 3 MG tablet Take 3 mg by mouth at bedtime.    Historical Provider, MD  traMADol (ULTRAM) 50 MG tablet Take 100 mg by mouth at bedtime.     Historical Provider, MD  valACYclovir (VALTREX) 500 MG tablet Take 500 mg by mouth at bedtime.     Historical Provider, MD    Allergies  Allergen Reactions  . Ciprofloxacin Anaphylaxis and Other (See Comments)    Other Reaction: Mouth Swelling Yrs ago  . Tapentadol Itching  . Amoxicillin Hives  . Morphine And Related Nausea And Vomiting  . Pineapple Other (See Comments)    Reaction:  Mouth blisters  . Sulfa Antibiotics Hives  . Tape Other (See Comments)    Reaction:  Clear tape tears skin.  Tegaderm and paper tape are ok.    . Other Other (See Comments) and Rash    Reaction:Clear tape tears skin.  Tegaderm and paper tape are ok. Reaction:Clear tape tears skin.  Tegaderm and paper tape are ok.    Past Surgical History:  Procedure Laterality Date  . ANTERIOR AND POSTERIOR REPAIR     done with bladder tact  . bladder tact    . CARDIAC CATHETERIZATION  2008   [facility] -  neg - report in paper chart  . CATARACT EXTRACTION Bilateral 2007  . CHOLECYSTECTOMY    . COLONOSCOPY N/A 01/04/2015   Procedure: COLONOSCOPY;  Surgeon: Lucilla Lame, MD;  Location: [place];  Service: Gastroenterology;  Laterality: N/A;  with biopsies  . ESOPHAGOGASTRODUODENOSCOPY (EGD) WITH PROPOFOL N/A 12/30/2015   Procedure: ESOPHAGOGASTRODUODENOSCOPY (EGD) WITH PROPOFOL;  Surgeon: Lucilla Lame, MD;  Location: [place];  Service: Endoscopy;  Laterality: N/A;  . FOOT SURGERY     multiple  . SEPTOPLASTY    . SHOULDER ARTHROSCOPY    . TONSILLECTOMY    . TUBAL LIGATION    . VAGINAL HYSTERECTOMY  1989   cervical cancer age 31    Social History  Substance Use Topics  . Smoking status: Never Smoker  . Smokeless tobacco: Never Used  . Alcohol use No     Medication list has  been reviewed and updated.   Physical Exam  Constitutional: She is oriented to person, place, and time. She appears well-developed. No distress.  HENT:  Head: Normocephalic and atraumatic.  Neck: Carotid bruit is not present.  Cardiovascular: Normal rate, regular rhythm, normal heart sounds and intact distal pulses.   No extrasystoles are present.  Pulmonary/Chest: Effort normal and breath sounds normal. No respiratory distress. She has no decreased breath sounds. She has no wheezes. She has no rhonchi.  Musculoskeletal: Normal range of motion.  Neurological: She is alert and oriented to person, place, and time.  Skin: Skin is warm and dry. No rash noted.     Psychiatric: She has a normal mood and affect. Her speech is normal and behavior is normal. Thought content normal.  Nursing note and vitals reviewed.   BP 108/60   Pulse (!) 109   Temp 97.6 F (36.4 C)   Ht 5\' 1"  (1.549 m)   Wt 222 lb (100.7 kg)   SpO2 97%   BMI 41.95 kg/m   Assessment and Plan: 1. Essential hypertension controlled  2. Migraine without aura and without status migrainosus, not intractable stable  3. Intermittent asthma, unspecified asthma severity, unspecified whether complicated Recent hospitalization Has not benefited from maintenance therapy in the past Has albuterol to use if needed  4. Skin lesion of breast Follow up with Dermatology re:bx   Halina Maidens, MD [facility]  07/21/2016

## 2016-07-22 ENCOUNTER — Encounter: Payer: Self-pay | Admitting: Internal Medicine

## 2016-07-27 ENCOUNTER — Encounter: Payer: Self-pay | Admitting: Internal Medicine

## 2016-07-27 ENCOUNTER — Ambulatory Visit (INDEPENDENT_AMBULATORY_CARE_PROVIDER_SITE_OTHER): Payer: Medicare Other | Admitting: Internal Medicine

## 2016-07-27 VITALS — BP 108/64 | HR 95 | Temp 97.6°F | Ht 61.0 in | Wt 224.0 lb

## 2016-07-27 DIAGNOSIS — J0100 Acute maxillary sinusitis, unspecified: Secondary | ICD-10-CM | POA: Diagnosis not present

## 2016-07-27 MED ORDER — AZITHROMYCIN 250 MG PO TABS: ORAL_TABLET | ORAL | 0 refills | Status: DC

## 2016-07-27 NOTE — Progress Notes (Signed)
Date:  07/27/2016   Name:  Brandi Bell   DOB:  Aug 08, 1960   MRN:  [id number]   Chief Complaint: Fatigue She noticed last night that everything smells like ammonia and she is fatigued.  Her fever from last week resolved.  The skin lesion turned out to be inflammation from EKG lead.  It is healing up nicely.   Review of Systems  Constitutional: Positive for fatigue. Negative for chills and fever.  HENT: Negative for congestion, sinus pain, sinus pressure, sore throat, tinnitus and trouble swallowing.        Change in sense of smell  Eyes: Negative for visual disturbance.  Respiratory: Negative for chest tightness, shortness of breath and wheezing.   Cardiovascular: Negative for chest pain.    Patient Active Problem List   Diagnosis Date Noted  . Vitamin D deficiency 07/21/2016  . Suicide attempt 07/19/2016  . PTSD (post-traumatic stress disorder) 07/19/2016  . Migraine headache 07/19/2016  . RLS (restless legs syndrome) 07/01/2016  . Osteoporosis 07/01/2016  . Trochanteric bursitis 03/17/2016  . Prediabetes 03/17/2016  . Gastritis   . Pituitary adenoma ([place]) 10/02/2015  . Borderline personality disorder 02/10/2015  . HTN (hypertension) 02/10/2015  . Chronic pain syndrome 02/10/2015  . Arthritis 02/10/2015  . Hx of colonic polyps   . Seasonal allergies 12/21/2013  . Fibromyalgia 12/21/2013  . Moderate persistent asthma without complication 123XX123  . Chronic nausea 06/20/2013  . Bipolar disorder ([place]) 06/20/2013  . Malignant neoplasm of cervix ([place]) 07/20/2012  . Lumbar herniated disc 07/20/2012  . Hyperlipidemia 07/20/2012  . 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 Breast mass 07/20/2012    Prior to Admission medications   Medication Sig Start Date End Date Taking? Authorizing Provider  albuterol (PROVENTIL HFA;VENTOLIN HFA) 108 (90 BASE) MCG/ACT inhaler Inhale 1-2 puffs into the lungs every 6 (six) hours as needed for wheezing or shortness  of breath.    Yes Historical Provider, MD  alendronate (FOSAMAX) 70 MG tablet Take 70 mg by mouth once a week.   Yes Historical Provider, MD  calcium carbonate (CALCIUM 600) 600 MG TABS tablet Take 600 mg by mouth 2 (two) times daily.   Yes Historical Provider, MD  Cholecalciferol (VITAMIN D3) 5000 units TABS Take 5,000 Units by mouth daily.   Yes Historical Provider, MD  clonazePAM (KLONOPIN) 1 MG tablet Take 0.5 mg by mouth 2 (two) times daily. Am and pm   Yes Historical Provider, MD  esomeprazole (NEXIUM) 40 MG capsule Take 1 capsule (40 mg total) by mouth daily before breakfast. 06/01/16  Yes Lucilla Lame, MD  gabapentin (NEURONTIN) 100 MG capsule Take 600 mg by mouth.   Yes Historical Provider, MD  Iloperidone (FANAPT) 6 MG TABS Take by mouth.   Yes Historical Provider, MD  ipratropium-albuterol (DUONEB) 0.5-2.5 (3) MG/3ML SOLN Inhale 3 mL by nebulization every six (6) hours as needed. 07/03/16 07/03/17 Yes Historical Provider, MD  losartan (COZAAR) 50 MG tablet Take 50 mg by mouth daily.   Yes Historical Provider, MD  ondansetron (ZOFRAN-ODT) 8 MG disintegrating tablet Take 1 tablet (8 mg total) by mouth every 8 (eight) hours as needed for nausea or vomiting. 06/01/16  Yes Lucilla Lame, MD  prazosin (MINIPRESS) 1 MG capsule Take 1 mg by mouth at bedtime.   Yes Historical Provider, MD  rizatriptan (MAXALT-MLT) 10 MG disintegrating tablet Take 10 mg by mouth as needed for migraine. May repeat in 2 hours if needed   Yes Historical  Provider, MD  rOPINIRole (REQUIP) 3 MG tablet Take 3 mg by mouth at bedtime.   Yes Historical Provider, MD  traMADol (ULTRAM) 50 MG tablet Take 100 mg by mouth at bedtime.    Yes Historical Provider, MD  valACYclovir (VALTREX) 500 MG tablet Take 500 mg by mouth at bedtime.    Yes Historical Provider, MD    Allergies  Allergen Reactions  . Ciprofloxacin Anaphylaxis and Other (See Comments)    Other Reaction: Mouth Swelling Yrs ago  . Tapentadol Itching  .  Amoxicillin Hives  . Morphine And Related Nausea And Vomiting  . Pineapple Other (See Comments)    Reaction:  Mouth blisters  . Sulfa Antibiotics Hives  . Tape Other (See Comments)    Reaction:  Clear tape tears skin.  Tegaderm and paper tape are ok.    . Other Other (See Comments) and Rash    Reaction:Clear tape tears skin.  Tegaderm and paper tape are ok. Reaction:Clear tape tears skin.  Tegaderm and paper tape are ok.    Past Surgical History:  Procedure Laterality Date  . ANTERIOR AND POSTERIOR REPAIR     done with bladder tact  . BILATERAL SALPINGOOPHORECTOMY  2004   pain from scar tissue  . bladder tact    . CARDIAC CATHETERIZATION  2008   [facility] - neg - report in paper chart  . CATARACT EXTRACTION Bilateral 2007  . CHOLECYSTECTOMY    . COLONOSCOPY N/A 01/04/2015   Procedure: COLONOSCOPY;  Surgeon: Lucilla Lame, MD;  Location: [place];  Service: Gastroenterology;  Laterality: N/A;  with biopsies  . ESOPHAGOGASTRODUODENOSCOPY (EGD) WITH PROPOFOL N/A 12/30/2015   Procedure: ESOPHAGOGASTRODUODENOSCOPY (EGD) WITH PROPOFOL;  Surgeon: Lucilla Lame, MD;  Location: [place];  Service: Endoscopy;  Laterality: N/A;  . FOOT SURGERY     multiple  . SEPTOPLASTY    . SHOULDER ARTHROSCOPY Bilateral 2008  . TONSILLECTOMY  6  . TUBAL LIGATION    . VAGINAL HYSTERECTOMY  1989   cervical cancer age 26    Social History  Substance Use Topics  . Smoking status: Never Smoker  . Smokeless tobacco: Never Used  . Alcohol use No     Medication list has been reviewed and updated.   Physical Exam  Constitutional: She is oriented to person, place, and time. She appears well-developed. No distress.  HENT:  Head: Normocephalic and atraumatic.  Right Ear: Tympanic membrane is retracted. Tympanic membrane is not erythematous.  Left Ear: Tympanic membrane is retracted. Tympanic membrane is not erythematous.  Nose: Right sinus exhibits no maxillary sinus tenderness  and no frontal sinus tenderness. Left sinus exhibits no maxillary sinus tenderness and no frontal sinus tenderness.  Mouth/Throat: Posterior oropharyngeal erythema present. No oropharyngeal exudate or posterior oropharyngeal edema.  Cardiovascular: Normal rate, regular rhythm and normal heart sounds.   Pulmonary/Chest: Effort normal and breath sounds normal. No respiratory distress.  Musculoskeletal: Normal range of motion.  Neurological: She is alert and oriented to person, place, and time.  Skin: Skin is warm and dry. No rash noted.  Psychiatric: She has a normal mood and affect. Her behavior is normal. Thought content normal.  Nursing note and vitals reviewed.   BP 108/64   Pulse 95   Temp 97.6 F (36.4 C)   Ht 5\' 1"  (1.549 m)   Wt 224 lb (101.6 kg)   SpO2 99%   BMI 42.32 kg/m   Assessment and Plan: 1. Acute non-recurrent maxillary sinusitis If ammonia odor does not  resolve, will need ENT evaluation - azithromycin (ZITHROMAX Z-PAK) 250 MG tablet; UAD  Dispense: 6 each; Refill: 0   Halina Maidens, MD [facility]  07/27/2016

## 2016-08-04 ENCOUNTER — Encounter: Payer: Self-pay | Admitting: Internal Medicine

## 2016-08-26 ENCOUNTER — Encounter: Payer: Self-pay | Admitting: Internal Medicine

## 2016-09-08 ENCOUNTER — Ambulatory Visit (INDEPENDENT_AMBULATORY_CARE_PROVIDER_SITE_OTHER): Payer: Medicare Other | Admitting: Internal Medicine

## 2016-09-08 ENCOUNTER — Encounter: Payer: Self-pay | Admitting: Internal Medicine

## 2016-09-08 VITALS — BP 120/82 | HR 80 | Ht 61.0 in | Wt 219.0 lb

## 2016-09-08 DIAGNOSIS — N3001 Acute cystitis with hematuria: Secondary | ICD-10-CM

## 2016-09-08 LAB — POC URINALYSIS WITH MICROSCOPIC (NON AUTO)MANUAL RESULT
Bilirubin, UA: NEGATIVE
CRYSTALS: 0
EPITHELIAL CELLS, URINE PER MICROSCOPY: 0
Glucose, UA: NEGATIVE
Ketones, UA: NEGATIVE
MUCUS UA: 0
Nitrite, UA: POSITIVE
PH UA: 8.5
PROTEIN UA: NEGATIVE
RBC: 100 M/uL — AB (ref 4.04–5.48)
Spec Grav, UA: <=1.005
Urobilinogen, UA: 0.2
WBC CASTS UA: 0

## 2016-09-08 MED ORDER — NITROFURANTOIN MONOHYD MACRO 100 MG PO CAPS: 100.0000 mg | ORAL_CAPSULE | Freq: Two times a day (BID) | ORAL | 0 refills | Status: DC

## 2016-09-08 NOTE — Progress Notes (Signed)
Date:  09/08/2016   Name:  Brandi Bell   DOB:  12/10/1960   MRN:  [id number]   Chief Complaint: Urinary Tract Infection (Blood clots in urine started this morning. Symptoms of UTI started this morning as well. "Had urges to go.") Hematuria  This is a new problem. The current episode started today. The problem is unchanged. She describes the hematuria as gross hematuria. The pain is mild. Irritative symptoms include frequency and urgency. Associated symptoms include dysuria. Pertinent negatives include no abdominal pain, chills, hesitancy, inability to urinate or nausea.  She had mild right flank pain 2 days ago that was similar to previous kidney stones.  However it did not last.    Review of Systems  Constitutional: Negative for chills.  Respiratory: Negative for chest tightness, shortness of breath and wheezing.   Cardiovascular: Negative for chest pain.  Gastrointestinal: Negative for abdominal pain and nausea.  Genitourinary: Positive for dysuria, frequency, hematuria and urgency. Negative for hesitancy.    Patient Active Problem List   Diagnosis Date Noted  . Vitamin D deficiency 07/21/2016  . Suicide attempt 07/19/2016  . PTSD (post-traumatic stress disorder) 07/19/2016  . Migraine headache 07/19/2016  . RLS (restless legs syndrome) 07/01/2016  . Osteoporosis 07/01/2016  . Trochanteric bursitis 03/17/2016  . Prediabetes 03/17/2016  . Gastritis   . Pituitary adenoma ([place]) 10/02/2015  . Borderline personality disorder 02/10/2015  . HTN (hypertension) 02/10/2015  . Chronic pain syndrome 02/10/2015  . Arthritis 02/10/2015  . Hx of colonic polyps   . Seasonal allergies 12/21/2013  . Fibromyalgia 12/21/2013  . Moderate persistent asthma without complication 123XX123  . Chronic nausea 06/20/2013  . Bipolar disorder ([place]) 06/20/2013  . Malignant neoplasm of cervix ([place]) 07/20/2012  . Lumbar herniated disc 07/20/2012  . Hyperlipidemia 07/20/2012  . HSV-2 (herpes  simplex virus 2) infection 07/20/2012  . Breast mass 07/20/2012    Prior to Admission medications   Medication Sig Start Date End Date Taking? Authorizing Provider  albuterol (PROVENTIL HFA;VENTOLIN HFA) 108 (90 BASE) MCG/ACT inhaler Inhale 1-2 puffs into the lungs every 6 (six) hours as needed for wheezing or shortness of breath.    Yes Historical Provider, MD  alendronate (FOSAMAX) 70 MG tablet Take 70 mg by mouth once a week.   Yes Historical Provider, MD  calcium carbonate (CALCIUM 600) 600 MG TABS tablet Take 600 mg by mouth 2 (two) times daily.   Yes Historical Provider, MD  Cholecalciferol (VITAMIN D3) 5000 units TABS Take 5,000 Units by mouth daily.   Yes Historical Provider, MD  clonazePAM (KLONOPIN) 1 MG tablet Take 0.5 mg by mouth 2 (two) times daily. Am and pm   Yes Historical Provider, MD  esomeprazole (NEXIUM) 40 MG capsule Take 1 capsule (40 mg total) by mouth daily before breakfast. 06/01/16  Yes Lucilla Lame, MD  ipratropium-albuterol (DUONEB) 0.5-2.5 (3) MG/3ML SOLN Inhale 3 mL by nebulization every six (6) hours as needed. 07/03/16 07/03/17 Yes Historical Provider, MD  ondansetron (ZOFRAN-ODT) 8 MG disintegrating tablet Take 1 tablet (8 mg total) by mouth every 8 (eight) hours as needed for nausea or vomiting. 06/01/16  Yes Lucilla Lame, MD  Oxcarbazepine (TRILEPTAL) 300 MG tablet Take 300 mg by mouth 2 (two) times daily.   Yes Historical Provider, MD  prazosin (MINIPRESS) 1 MG capsule Take 1 mg by mouth at bedtime.   Yes Historical Provider, MD  rizatriptan (MAXALT-MLT) 10 MG disintegrating tablet Take 10 mg by mouth as needed for migraine. May  repeat in 2 hours if needed   Yes Historical Provider, MD  rOPINIRole (REQUIP) 3 MG tablet Take 3 mg by mouth at bedtime.   Yes Historical Provider, MD  traMADol (ULTRAM) 50 MG tablet Take 100 mg by mouth at bedtime.    Yes Historical Provider, MD  valACYclovir (VALTREX) 500 MG tablet Take 500 mg by mouth at bedtime.    Yes Historical  Provider, MD  vortioxetine HBr (TRINTELLIX) 5 MG TABS Take 1 mg by mouth daily.   Yes Historical Provider, MD  azithromycin (ZITHROMAX Z-PAK) 250 MG tablet UAD Patient not taking: Reported on 09/08/2016 07/27/16   Glean Hess, MD  gabapentin (NEURONTIN) 100 MG capsule Take 600 mg by mouth.    Historical Provider, MD  Iloperidone (FANAPT) 6 MG TABS Take by mouth.    Historical Provider, MD  losartan (COZAAR) 50 MG tablet Take 50 mg by mouth daily.    Historical Provider, MD    Allergies  Allergen Reactions  . Ciprofloxacin Anaphylaxis and Other (See Comments)    Other Reaction: Mouth Swelling Yrs ago  . Tapentadol Itching  . Amoxicillin Hives  . Morphine And Related Nausea And Vomiting  . Pineapple Other (See Comments)    Reaction:  Mouth blisters  . Sulfa Antibiotics Hives  . Tape Other (See Comments)    Reaction:  Clear tape tears skin.  Tegaderm and paper tape are ok.    . Other Other (See Comments) and Rash    Reaction:Clear tape tears skin.  Tegaderm and paper tape are ok. Reaction:Clear tape tears skin.  Tegaderm and paper tape are ok.    Past Surgical History:  Procedure Laterality Date  . ANTERIOR AND POSTERIOR REPAIR     done with bladder tact  . BILATERAL SALPINGOOPHORECTOMY  2004   pain from scar tissue  . bladder tact    . CARDIAC CATHETERIZATION  2008   [facility] - neg - report in paper chart  . CATARACT EXTRACTION Bilateral 2007  . CHOLECYSTECTOMY    . COLONOSCOPY N/A 01/04/2015   Procedure: COLONOSCOPY;  Surgeon: Lucilla Lame, MD;  Location: [place];  Service: Gastroenterology;  Laterality: N/A;  with biopsies  . ESOPHAGOGASTRODUODENOSCOPY (EGD) WITH PROPOFOL N/A 12/30/2015   Procedure: ESOPHAGOGASTRODUODENOSCOPY (EGD) WITH PROPOFOL;  Surgeon: Lucilla Lame, MD;  Location: [place];  Service: Endoscopy;  Laterality: N/A;  . FOOT SURGERY     multiple  . SEPTOPLASTY    . SHOULDER ARTHROSCOPY Bilateral 2008  . TONSILLECTOMY  6  .  TUBAL LIGATION    . VAGINAL HYSTERECTOMY  1989   cervical cancer age 15    Social History  Substance Use Topics  . Smoking status: Never Smoker  . Smokeless tobacco: Never Used  . Alcohol use No     Medication list has been reviewed and updated.   Physical Exam  Constitutional: She appears well-developed and well-nourished.  Cardiovascular: Normal rate, regular rhythm and normal heart sounds.   Pulmonary/Chest: Effort normal and breath sounds normal. No respiratory distress.  Abdominal: Soft. Bowel sounds are normal. There is tenderness in the suprapubic area. There is no rebound, no guarding and no CVA tenderness.  Psychiatric: She has a normal mood and affect.  Nursing note and vitals reviewed.   BP 120/82   Pulse 80   Ht 5\' 1"  (1.549 m)   Wt 219 lb (99.3 kg)   SpO2 99%   BMI 41.38 kg/m   Assessment and Plan: 1. Acute cystitis with hematuria Return  for recheck in 7-10 days - POC urinalysis w microscopic (non auto)   Meds ordered this encounter  Medications  . nitrofurantoin, macrocrystal-monohydrate, (MACROBID) 100 MG capsule    Sig: Take 1 capsule (100 mg total) by mouth 2 (two) times daily.    Dispense:  14 capsule    Refill:  0    Halina Maidens, MD [facility]  09/08/2016

## 2016-09-16 ENCOUNTER — Ambulatory Visit (INDEPENDENT_AMBULATORY_CARE_PROVIDER_SITE_OTHER): Payer: Medicare Other

## 2016-09-16 VITALS — Wt 213.0 lb

## 2016-09-16 DIAGNOSIS — R3129 Other microscopic hematuria: Secondary | ICD-10-CM

## 2016-09-16 DIAGNOSIS — N390 Urinary tract infection, site not specified: Secondary | ICD-10-CM

## 2016-09-16 DIAGNOSIS — R319 Hematuria, unspecified: Secondary | ICD-10-CM

## 2016-09-16 LAB — POCT URINALYSIS DIPSTICK
BILIRUBIN UA: NEGATIVE
GLUCOSE UA: NEGATIVE
Ketones, UA: NEGATIVE
Leukocytes, UA: NEGATIVE
NITRITE UA: NEGATIVE
Spec Grav, UA: 1.015
UROBILINOGEN UA: 0.2
pH, UA: 6

## 2016-09-16 NOTE — Progress Notes (Signed)
Persistent hematuria (microscopic) and may have passed a kidney stone last week. Treated for UTI without resolution.

## 2016-09-17 ENCOUNTER — Other Ambulatory Visit: Payer: Self-pay | Admitting: *Deleted

## 2016-09-17 DIAGNOSIS — R3129 Other microscopic hematuria: Secondary | ICD-10-CM

## 2016-09-18 ENCOUNTER — Other Ambulatory Visit
Admission: RE | Admit: 2016-09-18 | Discharge: 2016-09-18 | Disposition: A | Discharge status: Home / Self Care | Payer: Medicare Other | Source: Ambulatory Visit | Attending: Urology | Admitting: Urology

## 2016-09-18 ENCOUNTER — Encounter: Payer: Self-pay | Admitting: Urology

## 2016-09-18 ENCOUNTER — Ambulatory Visit (INDEPENDENT_AMBULATORY_CARE_PROVIDER_SITE_OTHER): Payer: Medicare Other | Admitting: Urology

## 2016-09-18 VITALS — BP 150/82 | HR 82 | Ht 61.0 in | Wt 214.0 lb

## 2016-09-18 DIAGNOSIS — R3129 Other microscopic hematuria: Secondary | ICD-10-CM | POA: Diagnosis present

## 2016-09-18 DIAGNOSIS — R310 Gross hematuria: Secondary | ICD-10-CM

## 2016-09-18 DIAGNOSIS — Z87442 Personal history of urinary calculi: Secondary | ICD-10-CM | POA: Diagnosis not present

## 2016-09-18 LAB — URINALYSIS, COMPLETE (UACMP) WITH MICROSCOPIC
BACTERIA UA: NONE SEEN
Bilirubin Urine: NEGATIVE
Glucose, UA: NEGATIVE mg/dL
KETONES UR: NEGATIVE mg/dL
Leukocytes, UA: NEGATIVE
Nitrite: NEGATIVE
PROTEIN: NEGATIVE mg/dL
Specific Gravity, Urine: 1.025 (ref 1.005–1.030)
pH: 6.5 (ref 5.0–8.0)

## 2016-09-18 LAB — CREATININE, SERUM
CREATININE: 0.72 mg/dL (ref 0.44–1.00)
GFR calc Af Amer: >60 mL/min (ref >60)

## 2016-09-18 LAB — BUN: BUN: 14 mg/dL (ref 6–20)

## 2016-09-18 NOTE — Patient Instructions (Addendum)
Hematuria, Adult Hematuria is blood in your urine. It can be caused by a bladder infection, kidney infection, prostate infection, kidney stone, or cancer of your urinary tract. Infections can usually be treated with medicine, and a kidney stone usually will pass through your urine. If neither of these is the cause of your hematuria, further workup to find out the reason may be needed. It is very important that you tell your health care provider about any blood you see in your urine, even if the blood stops without treatment or happens without causing pain. Blood in your urine that happens and then stops and then happens again can be a symptom of a very serious condition. Also, pain is not a symptom in the initial stages of many urinary cancers. Follow these instructions at home:  Drink lots of fluid, 3-4 quarts a day. If you have been diagnosed with an infection, cranberry juice is especially recommended, in addition to large amounts of water.  Avoid caffeine, tea, and carbonated beverages because they tend to irritate the bladder.  Avoid alcohol because it may irritate the prostate.  Take all medicines as directed by your health care provider.  If you were prescribed an antibiotic medicine, finish it all even if you start to feel better.  If you have been diagnosed with a kidney stone, follow your health care provider's instructions regarding straining your urine to catch the stone.  Empty your bladder often. Avoid holding urine for long periods of time.  After a bowel movement, women should cleanse front to back. Use each tissue only once.  Empty your bladder before and after sexual intercourse if you are a female. Contact a health care provider if:  You develop back pain.  You have a fever.  You have a feeling of sickness in your stomach (nausea) or vomiting.  Your symptoms are not better in 3 days. Return sooner if you are getting worse. Get help right away if:  You develop  severe vomiting and are unable to keep the medicine down.  You develop severe back or abdominal pain despite taking your medicines.  You begin passing a large amount of blood or clots in your urine.  You feel extremely weak or faint, or you pass out. This information is not intended to replace advice given to you by your health care provider. Make sure you discuss any questions you have with your health care provider. Document Released: 06/22/2005 Document Revised: 11/28/2015 Document Reviewed: 02/20/2013 Elsevier Interactive Patient Education  2017 [place] scan is a kind of X-ray. A CT scan makes pictures of the inside of your body. In this procedure, the pictures will be taken in a large machine that has an opening (CT scanner). What happens before the procedure? Staying hydrated  Follow instructions from your doctor about hydration, which may include:  Up to 2 hours before the procedure - you may continue to drink clear liquids. These include water, clear fruit juice, black coffee, and plain tea. Eating and drinking restrictions  Follow instructions from your doctor about eating and drinking, which may include:  24 hours before the procedure - stop drinking caffeinated drinks. These include energy drinks, tea, soda, coffee, and hot chocolate.  8 hours before the procedure - stop eating heavy meals or foods. These include meat, fried foods, or fatty foods.  6 hours before the procedure - stop eating light meals or foods. These include toast or cereal.  6 hours before the procedure -  stop drinking milk or drinks that have milk in them.  2 hours before the procedure - stop drinking clear liquids. General instructions   Take off any jewelry.  Ask your doctor about changing or stopping your normal medicines. This is important if you take diabetes medicines or blood thinners. What happens during the procedure?  You will lie on a table with your arms above your  head.  An IV tube may be put into one of your veins.  Dye may be put into the IV tube. You may feel warm or have a metal taste in your mouth.  The table you will be lying on will move into the CT scanner.  You will be able to see, hear, and talk to the person who is running the machine while you are in it. Follow that person's directions.  The machine will move around you to take pictures. Do not move.  When the machine is done taking pictures, it will be turned off.  The table will be moved out of the machine.  Your IV tube will be taken out. The procedure may vary among doctors and hospitals. What happens after the procedure?  It is up to you to get your results. Ask when your results will be ready. Summary  A CT scan is a kind of X-ray.  A CT scan makes pictures of the inside of your body.  Follow instructions from your doctor about eating and drinking before the procedure.  You will be able to see, hear, and talk to the person who is running the machine while you are in it. Follow that person's directions. This information is not intended to replace advice given to you by your health care provider. Make sure you discuss any questions you have with your health care provider. Document Released: 09/18/2008 Document Revised: 07/09/2016 Document Reviewed: 07/09/2016 Elsevier Interactive Patient Education  2017 [place].  Cystoscopy Cystoscopy is a procedure that is used to help diagnose and sometimes treat conditions that affect that lower urinary tract. The lower urinary tract includes the bladder and the tube that drains urine from the bladder out of the body (urethra). Cystoscopy is performed with a thin, tube-shaped instrument with a light and camera at the end (cystoscope). The cystoscope may be hard (rigid) or flexible, depending on the goal of the procedure.The cystoscope is inserted through the urethra, into the bladder. Cystoscopy may be recommended if you  have:  Urinary tractinfections that keep coming back (recurring).  Blood in the urine (hematuria).  Loss of bladder control (urinary incontinence) or an overactive bladder.  Unusual cells found in a urine sample.  A blockage in the urethra.  Painful urination.  An abnormality in the bladder found during an intravenous pyelogram (IVP) or CT scan. Cystoscopy may also be done to remove a sample of tissue to be examined under a microscope (biopsy). Tell a health care provider about:  Any allergies you have.  All medicines you are taking, including vitamins, herbs, eye drops, creams, and over-the-counter medicines.  Any problems you or family members have had with anesthetic medicines.  Any blood disorders you have.  Any surgeries you have had.  Any medical conditions you have.  Whether you are pregnant or may be pregnant. What are the risks? Generally, this is a safe procedure. However, problems may occur, including:  Infection.  Bleeding.  Allergic reactions to medicines.  Damage to other structures or organs. What happens before the procedure?  Ask your health care provider  about:  Changing or stopping your regular medicines. This is especially important if you are taking diabetes medicines or blood thinners.  Taking medicines such as aspirin and ibuprofen. These medicines can thin your blood. Do not take these medicines before your procedure if your health care provider instructs you not to.  Follow instructions from your health care provider about eating or drinking restrictions.  You may be given antibiotic medicine to help prevent infection.  You may have an exam or testing, such as X-rays of the bladder, urethra, or kidneys.  You may have urine tests to check for signs of infection.  Plan to have someone take you home after the procedure. What happens during the procedure?  To reduce your risk of infection,your health care team will wash or sanitize  their hands.  You will be given one or more of the following:  A medicine to help you relax (sedative).  A medicine to numb the area (local anesthetic).  The area around the opening of your urethra will be cleaned.  The cystoscope will be passed through your urethra into your bladder.  Germ-free (sterile)fluid will flow through the cystoscope to fill your bladder. The fluid will stretch your bladder so that your surgeon can clearly examine your bladder walls.  The cystoscope will be removed and your bladder will be emptied. The procedure may vary among health care providers and hospitals. What happens after the procedure?  You may have some soreness or pain in your abdomen and urethra. Medicines will be available to help you.  You may have some blood in your urine.  Do not drive for 24 hours if you received a sedative. This information is not intended to replace advice given to you by your health care provider. Make sure you discuss any questions you have with your health care provider. Document Released: 06/19/2000 Document Revised: 10/31/2015 Document Reviewed: 05/09/2015 Elsevier Interactive Patient Education  2017 [employer].

## 2016-09-18 NOTE — Progress Notes (Signed)
09/18/2016 10:39 AM   Brandi Bell 1961/03/30 [id number]  Referring provider: Glean Hess, MD [address] [place], [place] [address]  Chief Complaint  Patient presents with  . New Patient (Initial Visit)    microscopic hematuria referred by Dr. Aida Puffer    HPI: Patient is a 56 -year-old [place] female who presents today as a referral from their PCP, Dr. Carolin Coy, for gross hematuria.    Patient had gross hematuria with clots over a week ago that lasted for several days.  She did not have any pain associated with the hematuria.    She does have a prior history of nephrolithiasis which she has passed spontaneously.  Her stone composition is unknown at this time.  She has not passed a stone in over 5 years.  She did have A/P repair with bladder sling about 5 years ago.  She does not have a history of trauma to the genitourinary tract or malignancies of the genitourinary tract.   Her daughter has a history of stones.  Her paternal aunt of bladder cancer.    Her baseline symptoms consist of frequent urination x 8-9,  urgency, nocturia x 1 and intermittency.  Her UA today demonstrates 6-30 RBC's.    She is not experiencing any suprapubic pain, abdominal pain or flank pain.  She denies any recent fevers, chills, nausea or vomiting.   Non contrast CT On 12/20/2012 performed for severe left-sided pain moving to the suprapubic area and noted bilateral nephrolithiasis. No hydronephrosis or ureterolithiasis evident. No acute abdominal or pelvic abnormality. It appears the patient is status  post hysterectomy. No adnexal mass is evident.   Films are not available for review.   She is not a smoker.  She not exposed to second hand smoke.  She did not work with Sports administrator, trichloroethylene, etc.   She has HTN.  She has a high BMI.     PMH: Past Medical History:  Diagnosis Date  . Anxiety   . Arthritis    osteo Back, Feet, Hands  . Asthma    [facility]  admission - on Bipap - 7/13 - report on paper chart/ flare 3 mos ago  . Bipolar disorder ([place])   . Bipolar disorder ([place])   . Bulging lumbar disc   . Cancer ([facility])    cervical  . Cataract   . Depression   . Elevated LFTs    having ultrasound [facility] - 12/14/14  . GERD (gastroesophageal reflux disease)   . Headache    migraines, every three or 4 days/ stress related  . Heart attack   . 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 History of migraine headaches   . Hypertension    [facility]cho -1/16 - report on paper chart/controlled on meds  . Microhematuria   . Motion sickness   . Osteoporosis   . Pneumonia    hx of  . PONV (postoperative nausea and vomiting)   . PTSD (post-traumatic stress disorder)   . Restless leg syndrome   . Rhinitis   . Shortness of breath dyspnea   . Spinal stenosis of cervical region   . Suicide attempt   . Vertigo 6 yrs ago   hx of    Surgical History: Past Surgical History:  Procedure Laterality Date  . ANTERIOR AND POSTERIOR REPAIR     done with bladder tact  . BILATERAL SALPINGOOPHORECTOMY  2004   pain from scar tissue  .  bladder tact    . CARDIAC CATHETERIZATION  2008   [facility] - neg - report in paper chart  . CATARACT EXTRACTION Bilateral 2007  . CHOLECYSTECTOMY    . COLONOSCOPY N/A 01/04/2015   Procedure: COLONOSCOPY;  Surgeon: Lucilla Lame, MD;  Location: [place];  Service: Gastroenterology;  Laterality: N/A;  with biopsies  . ESOPHAGOGASTRODUODENOSCOPY (EGD) WITH PROPOFOL N/A 12/30/2015   Procedure: ESOPHAGOGASTRODUODENOSCOPY (EGD) WITH PROPOFOL;  Surgeon: Lucilla Lame, MD;  Location: [place] [facility];  Service: Endoscopy;  Laterality: N/A;  . FOOT SURGERY     multiple  . SEPTOPLASTY    . SHOULDER ARTHROSCOPY Bilateral 2008  . TONSILLECTOMY  6  . TUBAL LIGATION    . VAGINAL HYSTERECTOMY  1989   cervical cancer age 7    Home Medications:  Allergies as of 09/18/2016      Reactions   Ciprofloxacin  Anaphylaxis, Other (See Comments)   Other Reaction: Mouth Swelling Yrs ago   Tapentadol Itching   Amoxicillin Hives   Morphine And Related Nausea And Vomiting   Pineapple Other (See Comments)   Reaction:  Mouth blisters   Sulfa Antibiotics Hives   Tape Other (See Comments)   Reaction:  Clear tape tears skin.  Tegaderm and paper tape are ok.     Other Other (See Comments), Rash   Reaction:Clear tape tears skin.  Tegaderm and paper tape are ok. Reaction:Clear tape tears skin.  Tegaderm and paper tape are ok.      Medication List       Accurate as of 09/18/16 10:39 AM. Always use your most recent med list.          albuterol 108 (90 Base) MCG/ACT inhaler Commonly known as:  PROVENTIL HFA;VENTOLIN HFA Inhale 1-2 puffs into the lungs every 6 (six) hours as needed for wheezing or shortness of breath.   alendronate 70 MG tablet Commonly known as:  FOSAMAX Take 70 mg by mouth once a week.   budesonide-formoterol 160-4.5 MCG/ACT inhaler Commonly known as:  SYMBICORT Inhale into the lungs.   CALCIUM 600 600 MG Tabs tablet Generic drug:  calcium carbonate Take 600 mg by mouth 2 (two) times daily.   Calcium Carbonate-Vitamin D3 600-400 MG-UNIT Tabs Take by mouth.   clonazePAM 1 MG tablet Commonly known as:  KLONOPIN Take 0.5 mg by mouth 2 (two) times daily. Am and pm   esomeprazole 40 MG capsule Commonly known as:  NEXIUM Take 1 capsule (40 mg total) by mouth daily before breakfast.   FANAPT 6 MG Tabs Generic drug:  Iloperidone Take by mouth.   gabapentin 100 MG capsule Commonly known as:  NEURONTIN Take 600 mg by mouth.   ipratropium-albuterol 0.5-2.5 (3) MG/3ML Soln Commonly known as:  DUONEB Inhale 3 mL by nebulization every six (6) hours as needed.   losartan 50 MG tablet Commonly known as:  COZAAR Take 50 mg by mouth daily.   mometasone 50 MCG/ACT nasal spray Commonly known as:  NASONEX Place into the nose.   MULTIVITAMIN ADULT PO Take by  mouth.   nitrofurantoin (macrocrystal-monohydrate) 100 MG capsule Commonly known as:  MACROBID Take 1 capsule (100 mg total) by mouth 2 (two) times daily.   ondansetron 8 MG disintegrating tablet Commonly known as:  ZOFRAN-ODT Take 1 tablet (8 mg total) by mouth every 8 (eight) hours as needed for nausea or vomiting.   Oxcarbazepine 300 MG tablet Commonly known as:  TRILEPTAL Take 300 mg by mouth 2 (two) times daily.  prazosin 1 MG capsule Commonly known as:  MINIPRESS Take 1 mg by mouth at bedtime.   rizatriptan 10 MG disintegrating tablet Commonly known as:  MAXALT-MLT Take 10 mg by mouth as needed for migraine. May repeat in 2 hours if needed   rOPINIRole 3 MG tablet Commonly known as:  REQUIP Take 3 mg by mouth at bedtime.   traMADol 50 MG tablet Commonly known as:  ULTRAM Take 100 mg by mouth at bedtime.   TRINTELLIX 5 MG Tabs Generic drug:  vortioxetine HBr Take 1 mg by mouth daily.   valACYclovir 500 MG tablet Commonly known as:  VALTREX Take 500 mg by mouth at bedtime.   Vitamin D3 5000 units Tabs Take 5,000 Units by mouth daily.       Allergies:  Allergies  Allergen Reactions  . Ciprofloxacin Anaphylaxis and Other (See Comments)    Other Reaction: Mouth Swelling Yrs ago  . Tapentadol Itching  . Amoxicillin Hives  . Morphine And Related Nausea And Vomiting  . Pineapple Other (See Comments)    Reaction:  Mouth blisters  . Sulfa Antibiotics Hives  . Tape Other (See Comments)    Reaction:  Clear tape tears skin.  Tegaderm and paper tape are ok.    . Other Other (See Comments) and Rash    Reaction:Clear tape tears skin.  Tegaderm and paper tape are ok. Reaction:Clear tape tears skin.  Tegaderm and paper tape are ok.    Family History: Family History  Problem Relation Age of Onset  . Asthma Mother   . Diabetes Mother   . Breast cancer Mother   . Melanoma Mother   . Heart attack Father 26  . Breast cancer Sister   . Brain cancer  Brother   . Kidney cancer Neg Hx   . Bladder Cancer Neg Hx   . Prostate cancer Neg Hx     Social History:  reports that she has never smoked. She has never used smokeless tobacco. She reports that she does not drink alcohol or use drugs.  ROS: UROLOGY Frequent Urination?: Yes Hard to postpone urination?: Yes Burning/pain with urination?: No Get up at night to urinate?: Yes Leakage of urine?: No Urine stream starts and stops?: Yes Trouble starting stream?: No Do you have to strain to urinate?: No Blood in urine?: Yes Urinary tract infection?: No Sexually transmitted disease?: Yes Injury to kidneys or bladder?: No Painful intercourse?: No Weak stream?: No Currently pregnant?: No Vaginal bleeding?: No Last menstrual period?: n  Gastrointestinal Nausea?: Yes Vomiting?: Yes Indigestion/heartburn?: Yes Diarrhea?: No Constipation?: No  Constitutional Fever: No Night sweats?: No Weight loss?: No Fatigue?: Yes  Skin Skin rash/lesions?: No Itching?: No  Eyes Blurred vision?: Yes Double vision?: No  Ears/Nose/Throat Sore throat?: No Sinus problems?: No  Hematologic/Lymphatic Swollen glands?: No Easy bruising?: No  Cardiovascular Leg swelling?: No Chest pain?: No  Respiratory Cough?: No Shortness of breath?: No  Endocrine Excessive thirst?: No  Musculoskeletal Back pain?: No Joint pain?: Yes  Neurological Headaches?: Yes Dizziness?: No  Psychologic Depression?: Yes Anxiety?: Yes  Physical Exam: BP (!) 150/82   Pulse 82   Ht 5\' 1"  (1.549 m)   Wt 214 lb (97.1 kg)   BMI 40.43 kg/m   Constitutional: Well nourished. Alert and oriented, No acute distress. HEENT: [place] AT, moist mucus membranes. Trachea midline, no masses. Cardiovascular: No clubbing, cyanosis, or edema. Respiratory: Normal respiratory effort, no increased work of breathing. GI: Abdomen is soft, non tender, non distended, no abdominal  masses. Liver and spleen not palpable.  No  hernias appreciated.  Stool sample for occult testing is not indicated.   GU: No CVA tenderness.  No bladder fullness or masses.   Skin: No rashes, bruises or suspicious lesions. Lymph: No cervical or inguinal adenopathy. Neurologic: Grossly intact, no focal deficits, moving all 4 extremities. Psychiatric: Normal mood and affect.  Laboratory Data: Lab Results  Component Value Date   WBC 11.4 (H) 05/25/2015   HGB 13.1 05/25/2015   HCT 39.1 05/25/2015   MCV 90.5 05/25/2015   PLT 258 05/25/2015    Lab Results  Component Value Date   CREATININE 0.72 09/18/2016     Lab Results  Component Value Date   HGBA1C 5.1 02/10/2015    Lab Results  Component Value Date   TSH 1.476 02/08/2015       Component Value Date/Time   CHOL 239 (H) 02/10/2015 1329   HDL 62 02/10/2015 1329   CHOLHDL 3.9 02/10/2015 1329   VLDL 19 02/10/2015 1329   LDLCALC 158 (H) 02/10/2015 1329    Lab Results  Component Value Date   AST 22 02/08/2015   Lab Results  Component Value Date   ALT 30 02/08/2015    Urinalysis 6-30 RBC's.  See EPIC.    Pertinent Imaging: REASON FOR EXAM:    severe l sided pain moving to suprapubic area  COMMENTS:   PROCEDURE:     CT  - CT ABDOMEN /PELVIS WO (STONE)  - Dec 20 2012  8:21AM   RESULT:     Renal stone protocol noncontrast CT of the abdomen and pelvis  is  reconstructed at 3 mm slice thickness in the axial plane. There is  previous  contrast-enhanced study from 06/10/2011 for comparison along with a study  of  01/30/2009.   Bilateral nephrolithiasis is present with multiple small approximately 2  possibly 3 mm calculi in both kidneys without evidence of hydronephrosis  or  hydroureter. No ureterolithiasis is seen. Phleboliths appear present in  the  pelvic region. No bladder calculi are evident. The urinary bladder is not  abnormally distended. There is no evidence of acute appendicitis, abnormal  bowel distention or bowel perforation. There is no  definite renal mass.  Cholecystectomy clips are present. Granulomatous calcifications are seen  within the spleen. The adrenal glands appear unremarkable. The pancreas is  within normal limits. The liver shows no focal mass or ductal dilation. No  adenopathy is evident. The aorta is normal in caliber. Bony structures  show  some degenerative disc narrowing at L5-S1 but are otherwise unremarkable.  Images through the base of the lungs demonstrate normal appearing  aeration.   IMPRESSION:  1. Bilateral nephrolithiasis. No hydronephrosis or ureterolithiasis  evident.  No acute abdominal or pelvic abnormality. It appears the patient is status  post hysterectomy. No adnexal mass is evident.   Dictation Site: 1     Assessment & Plan:    1. Gross hematuria  - I explained to the patient that there are a number of causes that can be associated with blood in the urine, such as stones, UTI's, damage to the urinary tract and/or cancer.  - At this time, I felt that the patient warranted further urologic evaluation.   The AUA guidelines state that a CT urogram is the preferred imaging study to evaluate hematuria.  - I explained to the patient that a contrast material will be injected into a vein and that in rare instances, an allergic reaction can  result and may even life threatening   The patient denies any allergies to contrast, iodine and/or seafood and is not taking metformin.  - Her reproductive status is hysterectomy  - Following the imaging study,  I've recommended a cystoscopy. I described how this is performed, typically in an office setting with a flexible cystoscope. We described the risks, benefits, and possible side effects, the most common of which is a minor amount of blood in the urine and/or burning which usually resolves in 24 to 48 hours.    - The patient had the opportunity to ask questions which were answered. Based upon this discussion, the patient is willing to proceed.  Therefore, I've ordered: a CT Urogram and cystoscopy.  - The patient will return following all of the above for discussion of the results.   - UA  - Urine culture  - BUN + creatinine    2. History of nephrolithiasis  - see above  Return for CT Urogram report and cystoscopy.  These notes generated with voice recognition software. I apologize for typographical errors.  Zara Council, [facility] [address] [place], [place] [address] [phone]

## 2016-09-19 LAB — URINE CULTURE

## 2016-09-20 ENCOUNTER — Encounter: Payer: Self-pay | Admitting: Urology

## 2016-09-21 ENCOUNTER — Telehealth: Payer: Self-pay

## 2016-09-21 NOTE — Telephone Encounter (Signed)
Spoke with pt in reference CT and ucx results. Pt stated that she does have her CT tomorrow morning at 10:30. Reinforced with pt to keep that appt as CT will give more information. Pt voiced understanding.

## 2016-09-21 NOTE — Telephone Encounter (Signed)
Pt sent several mychart message over the weekend and called after hours triage line. Pt is c/o frequency, gross hematuria with clots, and a dull ache in her lower abd. Pt mychart message says she cant wait until April to have CT and cysto. Please advise.

## 2016-09-21 NOTE — Telephone Encounter (Signed)
Her urine culture is negative from Friday. Her CT urogram is tomorrow according to Epic. Is this correct?

## 2016-09-22 ENCOUNTER — Other Ambulatory Visit: Payer: Self-pay | Admitting: Urology

## 2016-09-22 ENCOUNTER — Ambulatory Visit
Admission: RE | Admit: 2016-09-22 | Discharge: 2016-09-22 | Disposition: A | Discharge status: Home / Self Care | Payer: Medicare Other | Source: Ambulatory Visit | Attending: Urology | Admitting: Urology

## 2016-09-22 DIAGNOSIS — N202 Calculus of kidney with calculus of ureter: Secondary | ICD-10-CM | POA: Diagnosis not present

## 2016-09-22 DIAGNOSIS — I700 Atherosclerosis of aorta: Secondary | ICD-10-CM | POA: Diagnosis not present

## 2016-09-22 DIAGNOSIS — R310 Gross hematuria: Secondary | ICD-10-CM

## 2016-09-22 DIAGNOSIS — N200 Calculus of kidney: Secondary | ICD-10-CM

## 2016-09-22 MED ORDER — IOPAMIDOL (ISOVUE-300) INJECTION 61%
150.0000 mL | Freq: Once | INTRAVENOUS | Status: AC | PRN
  Administered 2016-09-22: 125 mL via INTRAVENOUS

## 2016-09-22 MED ORDER — TAMSULOSIN HCL 0.4 MG PO CAPS: 0.4000 mg | ORAL_CAPSULE | Freq: Every day | ORAL | 0 refills | Status: DC

## 2016-09-22 NOTE — Progress Notes (Signed)
Please schedule a KUB and UA in 2 weeks in [place].  Patient will pick up a straining tomorrow in [place].

## 2016-09-23 ENCOUNTER — Telehealth: Payer: Self-pay

## 2016-09-23 NOTE — Progress Notes (Signed)
Ordered KUB and UA for [place] for two weeks. Per Larene Beach patient knows.

## 2016-09-23 NOTE — Telephone Encounter (Signed)
Pt inquired about how long she should wait for stone to pass. Per Larene Beach pt should come to f/u appt to discuss things further.

## 2016-09-23 NOTE — Telephone Encounter (Signed)
-----   Message from Nori Riis, PA-C sent at 09/22/2016  8:55 PM EDT ----- I tried to call the patient last night, but there was no identification on her voicemail. I couldn't leave a detailed message.  These led the patient know that her CT scan demonstrated that she has kidney stones. So far, no cancer has been identified. I would like to speak with her on how she would want to address the stones. If she would let me know what time when she is available to speak with her regarding this.

## 2016-10-04 ENCOUNTER — Encounter: Payer: Self-pay | Admitting: Urology

## 2016-10-04 ENCOUNTER — Emergency Department
Admission: EM | Admit: 2016-10-04 | Discharge: 2016-10-04 | Disposition: A | Discharge status: Home / Self Care | Payer: Medicare Other | Attending: Emergency Medicine | Admitting: Emergency Medicine

## 2016-10-04 ENCOUNTER — Emergency Department: Payer: Medicare Other

## 2016-10-04 ENCOUNTER — Encounter: Payer: Self-pay | Admitting: Emergency Medicine

## 2016-10-04 DIAGNOSIS — Z79899 Other long term (current) drug therapy: Secondary | ICD-10-CM | POA: Insufficient documentation

## 2016-10-04 DIAGNOSIS — J45909 Unspecified asthma, uncomplicated: Secondary | ICD-10-CM | POA: Insufficient documentation

## 2016-10-04 DIAGNOSIS — R1030 Lower abdominal pain, unspecified: Secondary | ICD-10-CM | POA: Diagnosis present

## 2016-10-04 DIAGNOSIS — I10 Essential (primary) hypertension: Secondary | ICD-10-CM | POA: Diagnosis not present

## 2016-10-04 DIAGNOSIS — N201 Calculus of ureter: Secondary | ICD-10-CM | POA: Insufficient documentation

## 2016-10-04 LAB — URINALYSIS, COMPLETE (UACMP) WITH MICROSCOPIC
BACTERIA UA: NONE SEEN
Bilirubin Urine: NEGATIVE
GLUCOSE, UA: NEGATIVE mg/dL
HGB URINE DIPSTICK: NEGATIVE
KETONES UR: NEGATIVE mg/dL
NITRITE: NEGATIVE
PROTEIN: NEGATIVE mg/dL
Specific Gravity, Urine: 1.008 (ref 1.005–1.030)
pH: 6 (ref 5.0–8.0)

## 2016-10-04 MED ORDER — OXYCODONE-ACETAMINOPHEN 5-325 MG PO TABS: 1.0000 | ORAL_TABLET | Freq: Three times a day (TID) | ORAL | 0 refills | Status: AC | PRN

## 2016-10-04 MED ORDER — ONDANSETRON 4 MG PO TBDP
4.0000 mg | ORAL_TABLET | Freq: Once | ORAL | Status: AC
  Administered 2016-10-04: 4 mg via ORAL
  Filled 2016-10-04: qty 1

## 2016-10-04 MED ORDER — ONDANSETRON HCL 4 MG PO TABS: 4.0000 mg | ORAL_TABLET | Freq: Three times a day (TID) | ORAL | 1 refills | Status: AC | PRN

## 2016-10-04 MED ORDER — OXYCODONE-ACETAMINOPHEN 5-325 MG PO TABS
1.0000 | ORAL_TABLET | Freq: Once | ORAL | Status: AC
Start: 2016-10-04 — End: 2016-10-04
  Administered 2016-10-04: 1 via ORAL
  Filled 2016-10-04: qty 1

## 2016-10-04 NOTE — ED Notes (Signed)

## 2016-10-04 NOTE — ED Notes (Signed)
Patient transported to CT 

## 2016-10-04 NOTE — ED Notes (Addendum)
Pt states still on flomax but unsure if passed since no longer having hematuria. States has not been straining the urine. Follow up is scheduled with [facility]. Denies flank pain, states it constant bladder pain that burns

## 2016-10-04 NOTE — ED Triage Notes (Signed)
Pt was dx with kidney stone 3 weeks ago which she says will be evaluated by urology on the 14th to see if still there. Today she started having bladder pain.

## 2016-10-04 NOTE — ED Provider Notes (Signed)
[facility] Emergency Department Provider Note  ____________________________________________  Time seen: Approximately 8:06 PM  I have reviewed the triage vital signs and the nursing notes.   HISTORY  Chief Complaint Dysuria    HPI Brandi Bell is a 56 y.o. female with a history of nephrolithiasis presents to the emergency department with worsening 6/10 suprapubic pain. Patient states that she was diagnosed with a 4 mm left renal stone on 09/20/2016 without hydronephrosis at [facility] emergency Department. Patient states that her hematuria resolved and flank pain resolved. However, suprapubic pain started to occur today. Patient states that she has not been able to strain urine as directed. She denies dysuria or increased urinary frequency. Patient's suprapubic pain does not change with urination. Patient denies fever or chills. No alleviating measures have been attempted. Patient states that she has some nausea secondary to pain. She denies vomiting.  Past Medical History:  Diagnosis Date  . Anxiety   . Arthritis    osteo Back, Feet, Hands  . Asthma    [facility] admission - on Bipap - 7/13 - report on paper chart/ flare 3 mos ago  . Bipolar disorder ([place])   . Bipolar disorder ([place])   . Bulging lumbar disc   . Cancer (HCC)    cervical  . Cataract   . Depression   . Elevated LFTs    having ultrasound [facility] - 12/14/14  . GERD (gastroesophageal reflux disease)   . Headache    migraines, every three or 4 days/ stress related  . Heart attack   . 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 History of migraine headaches   . Hypertension    Echo -1/16 - report on paper chart/controlled on meds  . Microhematuria   . Motion sickness   . Osteoporosis   . Pneumonia    hx of  . PONV (postoperative nausea and vomiting)   . PTSD (post-traumatic stress disorder)   . Restless leg syndrome   . Rhinitis   . Shortness of  breath dyspnea   . Spinal stenosis of cervical region   . Suicide attempt   . Vertigo 6 yrs ago   hx of    Patient Active Problem List   Diagnosis Date Noted  . Vitamin D deficiency 07/21/2016  . Suicide attempt 07/19/2016  . PTSD (post-traumatic stress disorder) 07/19/2016  . Migraine headache 07/19/2016  . RLS (restless legs syndrome) 07/01/2016  . Osteoporosis 07/01/2016  . Trochanteric bursitis 03/17/2016  . Prediabetes 03/17/2016  . Gastritis   . Pituitary adenoma ([place]) 10/02/2015  . Borderline personality disorder 02/10/2015  . HTN (hypertension) 02/10/2015  . Chronic pain syndrome 02/10/2015  . Arthritis 02/10/2015  . Hx of colonic polyps   . Seasonal allergies 12/21/2013  . Fibromyalgia 12/21/2013  . Moderate persistent asthma without complication 61/44/3154  . Chronic nausea 06/20/2013  . Bipolar disorder ([place]) 06/20/2013  . Malignant neoplasm of cervix ([place]) 07/20/2012  . Lumbar herniated disc 07/20/2012  . Hyperlipidemia 07/20/2012  . 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 Breast mass 07/20/2012    Past Surgical History:  Procedure Laterality Date  . ANTERIOR AND POSTERIOR REPAIR     done with bladder tact  . BILATERAL SALPINGOOPHORECTOMY  2004   pain from scar tissue  . bladder tact    . CARDIAC CATHETERIZATION  2008   [facility] - neg - report in paper chart  . CATARACT EXTRACTION Bilateral 2007  .  CHOLECYSTECTOMY    . COLONOSCOPY N/A 01/04/2015   Procedure: COLONOSCOPY;  Surgeon: Lucilla Lame, MD;  Location: [place];  Service: Gastroenterology;  Laterality: N/A;  with biopsies  . ESOPHAGOGASTRODUODENOSCOPY (EGD) WITH PROPOFOL N/A 12/30/2015   Procedure: ESOPHAGOGASTRODUODENOSCOPY (EGD) WITH PROPOFOL;  Surgeon: Lucilla Lame, MD;  Location: [place];  Service: Endoscopy;  Laterality: N/A;  . FOOT SURGERY     multiple  . SEPTOPLASTY    . SHOULDER ARTHROSCOPY Bilateral 2008  . TONSILLECTOMY  6  . TUBAL LIGATION    .  VAGINAL HYSTERECTOMY  1989   cervical cancer age 89    Prior to Admission medications   Medication Sig Start Date End Date Taking? Authorizing Provider  albuterol (PROVENTIL HFA;VENTOLIN HFA) 108 (90 BASE) MCG/ACT inhaler Inhale 1-2 puffs into the lungs every 6 (six) hours as needed for wheezing or shortness of breath.     Historical Provider, MD  alendronate (FOSAMAX) 70 MG tablet Take 70 mg by mouth once a week.    Historical Provider, MD  budesonide-formoterol (SYMBICORT) 160-4.5 MCG/ACT inhaler Inhale into the lungs. 06/04/16 06/04/17  Historical Provider, MD  calcium carbonate (CALCIUM 600) 600 MG TABS tablet Take 600 mg by mouth 2 (two) times daily.    Historical Provider, MD  Calcium Carbonate-Vitamin D3 600-400 MG-UNIT TABS Take by mouth.    Historical Provider, MD  Cholecalciferol (VITAMIN D3) 5000 units TABS Take 5,000 Units by mouth daily.    Historical Provider, MD  clonazePAM (KLONOPIN) 1 MG tablet Take 0.5 mg by mouth 2 (two) times daily. Am and pm    Historical Provider, MD  esomeprazole (NEXIUM) 40 MG capsule Take 1 capsule (40 mg total) by mouth daily before breakfast. 06/01/16   Lucilla Lame, MD  gabapentin (NEURONTIN) 100 MG capsule Take 600 mg by mouth.    Historical Provider, MD  Iloperidone (FANAPT) 6 MG TABS Take by mouth.    Historical Provider, MD  ipratropium-albuterol (DUONEB) 0.5-2.5 (3) MG/3ML SOLN Inhale 3 mL by nebulization every six (6) hours as needed. 07/03/16 07/03/17  Historical Provider, MD  losartan (COZAAR) 50 MG tablet Take 50 mg by mouth daily.    Historical Provider, MD  mometasone (NASONEX) 50 MCG/ACT nasal spray Place into the nose. 06/12/16   Historical Provider, MD  Multiple Vitamins-Minerals (MULTIVITAMIN ADULT PO) Take by mouth.    Historical Provider, MD  nitrofurantoin, macrocrystal-monohydrate, (MACROBID) 100 MG capsule Take 1 capsule (100 mg total) by mouth 2 (two) times daily. Patient not taking: Reported on 09/18/2016 09/08/16   Glean Hess,  MD  ondansetron (ZOFRAN) 4 MG tablet Take 1 tablet (4 mg total) by mouth every 8 (eight) hours as needed for nausea or vomiting. 10/04/16 10/09/16  Lannie Fields, PA-C  ondansetron (ZOFRAN-ODT) 8 MG disintegrating tablet Take 1 tablet (8 mg total) by mouth every 8 (eight) hours as needed for nausea or vomiting. 06/01/16   Lucilla Lame, MD  Oxcarbazepine (TRILEPTAL) 300 MG tablet Take 300 mg by mouth 2 (two) times daily.    Historical Provider, MD  oxyCODONE-acetaminophen (ROXICET) 5-325 MG tablet Take 1 tablet by mouth every 8 (eight) hours as needed for severe pain. 10/04/16 10/09/16  Lannie Fields, PA-C  prazosin (MINIPRESS) 1 MG capsule Take 1 mg by mouth at bedtime.    Historical Provider, MD  rizatriptan (MAXALT-MLT) 10 MG disintegrating tablet Take 10 mg by mouth as needed for migraine. May repeat in 2 hours if needed    Historical Provider, MD  rOPINIRole (  REQUIP) 3 MG tablet Take 3 mg by mouth at bedtime.    Historical Provider, MD  tamsulosin (FLOMAX) 0.4 MG CAPS capsule Take 1 capsule (0.4 mg total) by mouth daily. 09/22/16   Nori Riis, PA-C  traMADol (ULTRAM) 50 MG tablet Take 100 mg by mouth at bedtime.     Historical Provider, MD  valACYclovir (VALTREX) 500 MG tablet Take 500 mg by mouth at bedtime.     Historical Provider, MD  vortioxetine HBr (TRINTELLIX) 5 MG TABS Take 1 mg by mouth daily.    Historical Provider, MD    Allergies Ciprofloxacin; Tapentadol; Amoxicillin; Morphine and related; Pineapple; Sulfa antibiotics; Tape; and Other  Family History  Problem Relation Age of Onset  . Asthma Mother   . Diabetes Mother   . Breast cancer Mother   . Melanoma Mother   . Heart attack Father 38  . Breast cancer Sister   . Brain cancer Brother   . Kidney cancer Neg Hx   . Bladder Cancer Neg Hx   . Prostate cancer Neg Hx     Social History Social History  Substance Use Topics  . Smoking status: Never Smoker  . Smokeless tobacco: Never Used  . Alcohol use No     Review  of Systems  Constitutional: No fever/chills Eyes: No visual changes. No discharge ENT: No upper respiratory complaints. Cardiovascular: no chest pain. Respiratory: no cough. No SOB. Gastrointestinal: Patient has suprapubic pain.  No nausea, no vomiting.  No diarrhea.  No constipation. Genitourinary: Negative for dysuria. No hematuria Musculoskeletal: Negative for musculoskeletal pain. Skin: Negative for rash, abrasions, lacerations, ecchymosis. Neurological: Negative for headaches, focal weakness or numbness.  ____________________________________________   PHYSICAL EXAM:  VITAL SIGNS: ED Triage Vitals  Enc Vitals Group     BP 10/04/16 1758 (!) 152/86     Pulse Rate 10/04/16 1758 96     Resp 10/04/16 1758 18     Temp 10/04/16 1758 98 F (36.7 C)     Temp src --      SpO2 10/04/16 1758 99 %     Weight 10/04/16 1759 214 lb (97.1 kg)     Height 10/04/16 1759 5\' 1"  (1.549 m)     Head Circumference --      Peak Flow --      Pain Score 10/04/16 1758 5     Pain Loc --      Pain Edu? --      Excl. in [place]? --     Constitutional: Alert and oriented. Well appearing and in no acute distress. Eyes: Conjunctivae are normal. PERRL. EOMI. Head: Atraumatic. Neck: No stridor.  No cervical spine tenderness to palpation. Cardiovascular: Normal rate, regular rhythm. Normal S1 and S2.  Good peripheral circulation. Respiratory: Normal respiratory effort without tachypnea or retractions. Lungs CTAB. Good air entry to the bases with no decreased or absent breath sounds. Gastrointestinal: Bowel sounds 4 quadrants. Suprapubic pain is elicited with light palpation. No guarding or rigidity. No palpable masses. No distention. No CVA tenderness. Musculoskeletal: Full range of motion to all extremities. No gross deformities appreciated. Neurologic:  Normal speech and language. No gross focal neurologic deficits are appreciated.  Skin:  Skin is warm, dry and intact. No rash noted. Psychiatric: Mood and  affect are normal. Speech and behavior are normal. Patient exhibits appropriate insight and judgement. ____________________________________________   LABS (all labs ordered are listed, but only abnormal results are displayed)  Labs Reviewed  URINALYSIS, COMPLETE (UACMP) WITH MICROSCOPIC -  Abnormal; Notable for the following:       Result Value   Color, Urine YELLOW (*)    APPearance CLEAR (*)    Leukocytes, UA TRACE (*)    Squamous Epithelial / LPF 0-5 (*)    All other components within normal limits   ____________________________________________  EKG   ____________________________________________  RADIOLOGY Unk Pinto, personally viewed and evaluated these images as part of my medical decision making, as well as reviewing the written report by the radiologist.  Ct Pelvis Wo Contrast  Result Date: 10/04/2016 CLINICAL DATA:  Suprapubic pain, hematuria for 3 weeks, diagnosed with a 4 mm distal LEFT ureteral calculus on 09/22/2016 EXAM: CT PELVIS WITHOUT CONTRAST TECHNIQUE: Multidetector CT imaging of the pelvis was performed following the standard protocol without intravenous contrast. Sagittal and coronal MPR images reconstructed from axial data set. Oral contrast not administered for this indication. COMPARISON:  09/22/2016 FINDINGS: Urinary Tract: 4 mm distal LEFT ureteral calculus again identified. Bladder and ureters otherwise normal appearance. Bowel: Few distal colonic diverticula noted. Pelvic bowel loops otherwise unremarkable. Vascular/Lymphatic: Scattered pelvic phleboliths. Iliac arterial calcifications. Reproductive: Uterus surgically absent. Nonvisualization of ovaries. Other:  No free air free fluid. Musculoskeletal: Unremarkable IMPRESSION: Persistent 4 mm distal LEFT ureteral calculus. Few uncomplicated distal colonic diverticula. Electronically Signed   By: Lavonia Dana M.D.   On: 10/04/2016 21:04     ____________________________________________    PROCEDURES  Procedure(s) performed:    Procedures    Medications  oxyCODONE-acetaminophen (PERCOCET/ROXICET) 5-325 MG per tablet 1 tablet (1 tablet Oral Given 10/04/16 2015)  ondansetron (ZOFRAN-ODT) disintegrating tablet 4 mg (4 mg Oral Given 10/04/16 2016)     ____________________________________________   INITIAL IMPRESSION / ASSESSMENT AND PLAN / ED COURSE  Pertinent labs & imaging results that were available during my care of the patient were reviewed by me and considered in my medical decision making (see chart for details).  Review of the  [place]RS was performed in accordance of the [place] prior to dispensing any controlled drugs.     Assessment and plan: Ureteral calculus Patient presents to the emergency department with suprapubic pain. CT pelvis without contrast indicates a persistent left 4 mm ureteral calculus. Patient was given Roxicet in the emergency department. She was discharged with Roxicet for pain. Patient has a follow-up appointment with her urologist on April 13th. Patient was advised to keep her appointment on April 13. Hydration was encouraged. All patient questions were answered.  ____________________________________________  FINAL CLINICAL IMPRESSION(S) / ED DIAGNOSES  Final diagnoses:  Nephrolithiasis      NEW MEDICATIONS STARTED DURING THIS VISIT:  New Prescriptions   ONDANSETRON (ZOFRAN) 4 MG TABLET    Take 1 tablet (4 mg total) by mouth every 8 (eight) hours as needed for nausea or vomiting.   OXYCODONE-ACETAMINOPHEN (ROXICET) 5-325 MG TABLET    Take 1 tablet by mouth every 8 (eight) hours as needed for severe pain.        This chart was dictated using voice recognition software/Dragon. Despite best efforts to proofread, errors can occur which can change the meaning. Any change was purely unintentional.    Lannie Fields, PA-C 10/04/16 2133    Darel Hong, MD 10/04/16  2210

## 2016-10-06 ENCOUNTER — Telehealth: Payer: Self-pay

## 2016-10-06 ENCOUNTER — Other Ambulatory Visit: Payer: Self-pay

## 2016-10-06 MED ORDER — ALBUTEROL SULFATE HFA 108 (90 BASE) MCG/ACT IN AERS: 1.0000 | INHALATION_SPRAY | Freq: Four times a day (QID) | RESPIRATORY_TRACT | 5 refills | Status: DC | PRN

## 2016-10-06 MED ORDER — RIZATRIPTAN BENZOATE 10 MG PO TBDP: 10.0000 mg | ORAL_TABLET | ORAL | 5 refills | Status: DC | PRN

## 2016-10-07 NOTE — Telephone Encounter (Signed)
ERROR MSG

## 2016-10-14 ENCOUNTER — Ambulatory Visit
Admission: RE | Admit: 2016-10-14 | Discharge: 2016-10-14 | Disposition: A | Discharge status: Home / Self Care | Payer: Medicare Other | Source: Ambulatory Visit | Attending: Urology | Admitting: Urology

## 2016-10-14 ENCOUNTER — Other Ambulatory Visit
Admission: RE | Admit: 2016-10-14 | Discharge: 2016-10-14 | Disposition: A | Discharge status: Home / Self Care | Payer: Medicare Other | Source: Ambulatory Visit | Attending: Urology | Admitting: Urology

## 2016-10-14 DIAGNOSIS — N200 Calculus of kidney: Secondary | ICD-10-CM

## 2016-10-14 DIAGNOSIS — N201 Calculus of ureter: Secondary | ICD-10-CM | POA: Diagnosis not present

## 2016-10-14 LAB — URINALYSIS, COMPLETE (UACMP) WITH MICROSCOPIC
Bacteria, UA: NONE SEEN
Bilirubin Urine: NEGATIVE
Glucose, UA: NEGATIVE mg/dL
Ketones, ur: NEGATIVE mg/dL
Leukocytes, UA: NEGATIVE
Nitrite: NEGATIVE
PROTEIN: NEGATIVE mg/dL
Specific Gravity, Urine: 1.015 (ref 1.005–1.030)
pH: 7 (ref 5.0–8.0)

## 2016-10-15 ENCOUNTER — Other Ambulatory Visit: Payer: Self-pay

## 2016-10-15 DIAGNOSIS — R310 Gross hematuria: Secondary | ICD-10-CM

## 2016-10-16 ENCOUNTER — Ambulatory Visit (INDEPENDENT_AMBULATORY_CARE_PROVIDER_SITE_OTHER): Payer: Medicare Other | Admitting: Urology

## 2016-10-16 ENCOUNTER — Encounter: Payer: Self-pay | Admitting: Urology

## 2016-10-16 VITALS — BP 145/92 | HR 91 | Ht 61.0 in | Wt 212.0 lb

## 2016-10-16 DIAGNOSIS — N201 Calculus of ureter: Secondary | ICD-10-CM

## 2016-10-16 DIAGNOSIS — R310 Gross hematuria: Secondary | ICD-10-CM | POA: Diagnosis not present

## 2016-10-16 MED ORDER — LIDOCAINE HCL 2 % EX GEL
1.0000 "application " | Freq: Once | CUTANEOUS | Status: AC
  Administered 2016-10-16: 1 via URETHRAL

## 2016-10-16 MED ORDER — DOXYCYCLINE HYCLATE 100 MG PO TABS
100.0000 mg | ORAL_TABLET | Freq: Once | ORAL | Status: AC
  Administered 2016-10-16: 100 mg via ORAL

## 2016-10-20 ENCOUNTER — Telehealth: Payer: Self-pay | Admitting: Radiology

## 2016-10-20 ENCOUNTER — Other Ambulatory Visit: Payer: Self-pay | Admitting: Radiology

## 2016-10-20 NOTE — Telephone Encounter (Signed)
Notified pt of surgery scheduled with Dr Erlene Quan on 10/26/16, pre-admit testing appt & to call Friday prior to surgery for arrival time to SDS. Questions answered. Pt voices understanding.

## 2016-10-20 NOTE — Progress Notes (Signed)
   10/20/16  CC:  Chief Complaint  Patient presents with  . Cysto    results    HPI: 56 year old female who presents today for cystoscopy for further evaluation of severe gross hematuria with clots. She previously underwent CT urogram on 09/22/2016 found have an incidental left still ureteral stone, 4 mm in caliber which appears to be nonobstructing as well as other smaller nonobstructing calculi.  She underwent follow-up CT scan ordered by her PCP on 10/04/2016 and her stone persists. Is also appreciated on KUB from 10/14/2016.  No other GU pathology was appreciated on CT urogram.  She denies of her flank pain.  She is a nonsmoker.  She does have a prior history of nephrolithiasis which she has passed spontaneously.  Her stone composition is unknown at this time.  She has not passed a stone in over 5 years.  She did have A/P repair with bladder sling about 5 years ago.  She does not have a history of trauma to the genitourinary tract or malignancies of the genitourinary tract  Blood pressure (!) 145/92, pulse 91, height 5\' 1"  (1.549 m), weight 212 lb (96.2 kg). NED. A&Ox3.   No respiratory distress   Abd soft, NT, ND Normal external genitalia with patent urethral meatus  Cystoscopy Procedure Note  Patient identification was confirmed, informed consent was obtained, and patient was prepped using Betadine solution.  Lidocaine jelly was administered per urethral meatus.    Preoperative abx where received prior to procedure.    Procedure: - Flexible cystoscope introduced, without any difficulty.   - Thorough search of the bladder revealed:    normal urethral meatus    normal urothelium    no stones    no ulcers     no tumors    no urethral polyps    no trabeculation  - Ureteral orifices were normal in position and appearance.  Post-Procedure: - Patient tolerated the procedure well  Assessment/ Plan:  1) Left ureteral stone- Retained ureteral stone, recommend  intervention.   Risks and benefits of ureteroscopy were reviewed including but not limited to infection, bleeding, pain, ureteral injury which could require open surgery versus prolonged indwelling if ureteralperforation occurs, persistent stone disease, requirement for staged procedure, possible stent, and global anesthesia risks. Patient expressed understanding and desires to proceed with ureteroscopy.  2) Hematuria- Cysto negative.  Likely secondary #1.    Hollice Espy, MD

## 2016-10-21 ENCOUNTER — Telehealth: Payer: Self-pay

## 2016-10-21 ENCOUNTER — Encounter: Payer: Self-pay | Admitting: Urology

## 2016-10-21 NOTE — Telephone Encounter (Signed)
Pt called stating she is having left side URS on Monday but now is having right sided pain. Nurse was reading through pt previous imaging and noted where the left side was mentioned several times but not the right. While nurse was reading through pt chart pt became angry and stated "im not arguing with you" and hung up.

## 2016-10-21 NOTE — Telephone Encounter (Signed)
Spoke with pt regarding new symptoms. Per Dr Erlene Quan, advised pt that she would discuss this with the patient before surgery on 10/26/16 & would address these new symptoms at that time. Pt voices understanding.

## 2016-10-22 ENCOUNTER — Encounter
Admission: RE | Admit: 2016-10-22 | Discharge: 2016-10-22 | Disposition: A | Discharge status: Home / Self Care | Payer: Medicare Other | Source: Ambulatory Visit | Attending: Urology | Admitting: Urology

## 2016-10-22 DIAGNOSIS — Z01810 Encounter for preprocedural cardiovascular examination: Secondary | ICD-10-CM | POA: Diagnosis present

## 2016-10-22 DIAGNOSIS — Z01812 Encounter for preprocedural laboratory examination: Secondary | ICD-10-CM | POA: Insufficient documentation

## 2016-10-22 DIAGNOSIS — I071 Rheumatic tricuspid insufficiency: Secondary | ICD-10-CM | POA: Diagnosis not present

## 2016-10-22 HISTORY — DX: Personal history of urinary calculi: Z87.442

## 2016-10-22 HISTORY — DX: Irritable bowel syndrome, unspecified: K58.9

## 2016-10-22 HISTORY — DX: Anemia, unspecified: D64.9

## 2016-10-22 LAB — CBC
HCT: 43.6 % (ref 35.0–47.0)
HEMOGLOBIN: 14.8 g/dL (ref 12.0–16.0)
MCH: 30.5 pg (ref 26.0–34.0)
MCHC: 33.9 g/dL (ref 32.0–36.0)
MCV: 89.9 fL (ref 80.0–100.0)
Platelets: 246 10*3/uL (ref 150–440)
RBC: 4.86 MIL/uL (ref 3.80–5.20)
RDW: 12.9 % (ref 11.5–14.5)
WBC: 7 10*3/uL (ref 3.6–11.0)

## 2016-10-22 LAB — DIFFERENTIAL
Basophils Absolute: 0.1 10*3/uL (ref 0–0.1)
Basophils Relative: 1 %
EOS ABS: 0.1 10*3/uL (ref 0–0.7)
EOS PCT: 1 %
LYMPHS ABS: 1.9 10*3/uL (ref 1.0–3.6)
LYMPHS PCT: 27 %
Monocytes Absolute: 0.7 10*3/uL (ref 0.2–0.9)
Monocytes Relative: 11 %
Neutro Abs: 4.2 10*3/uL (ref 1.4–6.5)
Neutrophils Relative %: 60 %

## 2016-10-22 NOTE — Progress Notes (Signed)
Gentamicin consult for Surgical prophylaxis  56 yo F for URETEROSCOPY  With cystoscopy.  Will order Gentamicin 340 mg (5mg /kg) x 1 oncall for Surgical prophylaxis for 10/26/16. Entered as Signed and held order.  Chinita Greenland PharmD Clinical Pharmacist 10/22/2016

## 2016-10-22 NOTE — Pre-Procedure Instructions (Signed)
EKG sent to Anesthesia for review. 

## 2016-10-22 NOTE — Patient Instructions (Signed)
Your procedure is scheduled on: October 26, 2016 (Monday) Report to Same Day Surgery 2nd floor medical mall [facility] Entrance-take elevator on left to 2nd floor.  Check in with surgery information desk.) To find out your arrival time please call [phone] between 1PM - 3PM on October 23, 2016 (Friday) Remember: Instructions that are not followed completely may result in serious medical risk, up to and including death, or upon the discretion of your surgeon and anesthesiologist your surgery may need to be rescheduled.    _x___ 1. Do not eat food or drink liquids after midnight. No gum chewing or hard candies                            __x__ 2. No Alcohol for 24 hours before or after surgery.   __x__3. No Smoking for 24 prior to surgery.   ____  4. Bring all medications with you on the day of surgery if instructed.    __x__ 5. Notify your doctor if there is any change in your medical condition     (cold, fever, infections).     Do not wear jewelry, make-up, hairpins, clips or nail polish.  Do not wear lotions, powders, or perfumes. You may wear deodorant.  Do not shave 48 hours prior to surgery. Men may shave face and neck.  Do not bring valuables to the hospital.    [facility] is not responsible for any belongings or valuables.               Contacts, dentures or bridgework may not be worn into surgery.  Leave your suitcase in the car. After surgery it may be brought to your room.  For patients admitted to the hospital, discharge time is determined by your treatment team                         Patients discharged the day of surgery will not be allowed to drive home.  You will need someone to drive you home and stay with you the night of your procedure.    Please read over the following fact sheets that you were given:   [facility] Preparing for Surgery and or MRSA Information   _x___ Take anti-hypertensive (unless it includes a diuretic), cardiac, seizure, asthma,      anti-reflux and psychiatric medicines with a sip of water These include:  1. Nexium (Nexium at bedtime on Sunday night, April 22)  2. Clonazepam  3. Trintellix  4.  5.  6.  ____Fleets enema or Magnesium Citrate as directed.   ___ Use CHG Soap or sage wipes as directed on instruction sheet   __x__ Use inhalers on the day of surgery and bring to hospital day of surgery (USE DUONEB NEBULIZER THE MORNING OF SURGERY AND BRING ALBUTEROL [place] )  ____ Stop Metformin and Janumet 2 days prior to surgery.    ____ Take 1/2 of usual insulin dose the night before surgery and none on the morning surgery  _x___ Follow recommendations from Cardiologist, Pulmonologist or PCP regarding          stopping Aspirin, Coumadin, Pllavix ,Eliquis, Effient, or Pradaxa, and Pletal.  X____Stop Anti-inflammatories such as Advil, Aleve, Ibuprofen, Motrin, Naproxen, Naprosyn, Goodies powders or aspirin products. OK to take Tylenol  _x___ Stop supplements until after surgery.  But may continue Vitamin D, Vitamin B,       and multivitamin.   ____ Bring C-Pap to the hospital.

## 2016-10-26 ENCOUNTER — Emergency Department
Admission: EM | Admit: 2016-10-26 | Discharge: 2016-10-26 | Disposition: A | Discharge status: Home / Self Care | Payer: Medicare Other | Source: Home / Self Care | Attending: Emergency Medicine | Admitting: Emergency Medicine

## 2016-10-26 ENCOUNTER — Ambulatory Visit
Admission: RE | Admit: 2016-10-26 | Discharge: 2016-10-26 | Disposition: A | Discharge status: Home / Self Care | Payer: Medicare Other | Source: Ambulatory Visit | Attending: Urology | Admitting: Urology

## 2016-10-26 ENCOUNTER — Encounter
Admission: RE | Disposition: A | Discharge status: Home / Self Care | Payer: Self-pay | Source: Ambulatory Visit | Attending: Urology

## 2016-10-26 ENCOUNTER — Ambulatory Visit: Payer: Medicare Other | Admitting: Registered Nurse

## 2016-10-26 ENCOUNTER — Encounter: Payer: Self-pay | Admitting: *Deleted

## 2016-10-26 DIAGNOSIS — Z87442 Personal history of urinary calculi: Secondary | ICD-10-CM | POA: Diagnosis not present

## 2016-10-26 DIAGNOSIS — N132 Hydronephrosis with renal and ureteral calculous obstruction: Secondary | ICD-10-CM | POA: Insufficient documentation

## 2016-10-26 DIAGNOSIS — I10 Essential (primary) hypertension: Secondary | ICD-10-CM

## 2016-10-26 DIAGNOSIS — Z6841 Body Mass Index (BMI) 40.0 and over, adult: Secondary | ICD-10-CM | POA: Diagnosis not present

## 2016-10-26 DIAGNOSIS — R112 Nausea with vomiting, unspecified: Secondary | ICD-10-CM

## 2016-10-26 DIAGNOSIS — R310 Gross hematuria: Secondary | ICD-10-CM | POA: Diagnosis present

## 2016-10-26 DIAGNOSIS — R51 Headache: Secondary | ICD-10-CM | POA: Insufficient documentation

## 2016-10-26 DIAGNOSIS — N202 Calculus of kidney with calculus of ureter: Secondary | ICD-10-CM

## 2016-10-26 DIAGNOSIS — Z79899 Other long term (current) drug therapy: Secondary | ICD-10-CM | POA: Insufficient documentation

## 2016-10-26 DIAGNOSIS — Z9889 Other specified postprocedural states: Principal | ICD-10-CM

## 2016-10-26 DIAGNOSIS — R109 Unspecified abdominal pain: Secondary | ICD-10-CM | POA: Insufficient documentation

## 2016-10-26 DIAGNOSIS — N201 Calculus of ureter: Secondary | ICD-10-CM

## 2016-10-26 DIAGNOSIS — J45909 Unspecified asthma, uncomplicated: Secondary | ICD-10-CM | POA: Insufficient documentation

## 2016-10-26 HISTORY — PX: CYSTOSCOPY W/ RETROGRADES: SHX1426

## 2016-10-26 HISTORY — PX: URETEROSCOPY WITH HOLMIUM LASER LITHOTRIPSY: SHX6645

## 2016-10-26 HISTORY — PX: CYSTOSCOPY WITH STENT PLACEMENT: SHX5790

## 2016-10-26 LAB — CBC WITH DIFFERENTIAL/PLATELET
Basophils Absolute: 0.1 10*3/uL (ref 0–0.1)
Basophils Relative: 1 %
Eosinophils Absolute: 0 10*3/uL (ref 0–0.7)
Eosinophils Relative: 0 %
HEMATOCRIT: 46.2 % (ref 35.0–47.0)
Hemoglobin: 16 g/dL (ref 12.0–16.0)
LYMPHS ABS: 1.5 10*3/uL (ref 1.0–3.6)
LYMPHS PCT: 12 %
MCH: 31 pg (ref 26.0–34.0)
MCHC: 34.7 g/dL (ref 32.0–36.0)
MCV: 89.2 fL (ref 80.0–100.0)
MONO ABS: 0.7 10*3/uL (ref 0.2–0.9)
MONOS PCT: 6 %
NEUTROS ABS: 9.7 10*3/uL — AB (ref 1.4–6.5)
Neutrophils Relative %: 81 %
Platelets: 302 10*3/uL (ref 150–440)
RBC: 5.18 MIL/uL (ref 3.80–5.20)
RDW: 12.8 % (ref 11.5–14.5)
WBC: 12 10*3/uL — ABNORMAL HIGH (ref 3.6–11.0)

## 2016-10-26 LAB — URINALYSIS, COMPLETE (UACMP) WITH MICROSCOPIC
BACTERIA UA: NONE SEEN
Bilirubin Urine: NEGATIVE
Glucose, UA: NEGATIVE mg/dL
KETONES UR: 5 mg/dL — AB
Leukocytes, UA: NEGATIVE
Nitrite: NEGATIVE
PH: 9 — AB (ref 5.0–8.0)
Protein, ur: 100 mg/dL — AB
Specific Gravity, Urine: 1.009 (ref 1.005–1.030)

## 2016-10-26 LAB — BASIC METABOLIC PANEL
ANION GAP: 8 (ref 5–15)
BUN: 7 mg/dL (ref 6–20)
CALCIUM: 9.6 mg/dL (ref 8.9–10.3)
CO2: 27 mmol/L (ref 22–32)
Chloride: 100 mmol/L — ABNORMAL LOW (ref 101–111)
Creatinine, Ser: 0.76 mg/dL (ref 0.44–1.00)
Glucose, Bld: 120 mg/dL — ABNORMAL HIGH (ref 65–99)
Potassium: 4.4 mmol/L (ref 3.5–5.1)
Sodium: 135 mmol/L (ref 135–145)

## 2016-10-26 SURGERY — URETEROSCOPY, WITH LITHOTRIPSY USING HOLMIUM LASER
Anesthesia: General | Site: Ureter | Laterality: Right | Wound class: Clean Contaminated

## 2016-10-26 MED ORDER — PROMETHAZINE HCL 25 MG/ML IJ SOLN
6.2500 mg | INTRAMUSCULAR | Status: AC | PRN
  Administered 2016-10-26 (×2): 6.25 mg via INTRAVENOUS

## 2016-10-26 MED ORDER — SCOPOLAMINE 1 MG/3DAYS TD PT72
1.0000 | MEDICATED_PATCH | Freq: Once | TRANSDERMAL | Status: DC
  Administered 2016-10-26: 1.5 mg via TRANSDERMAL

## 2016-10-26 MED ORDER — HYDROCODONE-ACETAMINOPHEN 5-325 MG PO TABS: 1.0000 | ORAL_TABLET | Freq: Four times a day (QID) | ORAL | 0 refills | Status: DC | PRN

## 2016-10-26 MED ORDER — OXYBUTYNIN CHLORIDE 5 MG PO TABS: 5.0000 mg | ORAL_TABLET | Freq: Three times a day (TID) | ORAL | 0 refills | Status: DC | PRN

## 2016-10-26 MED ORDER — TERAZOSIN HCL 5 MG PO CAPS: 5.0000 mg | ORAL_CAPSULE | Freq: Every day | ORAL | 0 refills | Status: DC

## 2016-10-26 MED ORDER — FENTANYL CITRATE (PF) 100 MCG/2ML IJ SOLN
50.0000 ug | Freq: Once | INTRAMUSCULAR | Status: AC
  Administered 2016-10-26: 50 ug via INTRAVENOUS
  Filled 2016-10-26: qty 2

## 2016-10-26 MED ORDER — GENTAMICIN SULFATE 40 MG/ML IJ SOLN
5.0000 mg/kg | INTRAMUSCULAR | Status: AC
Start: 2016-10-26 — End: 2016-10-26
  Administered 2016-10-26: 340 mg via INTRAVENOUS
  Filled 2016-10-26: qty 8.5

## 2016-10-26 MED ORDER — PROMETHAZINE HCL 25 MG/ML IJ SOLN
12.5000 mg | Freq: Once | INTRAMUSCULAR | Status: AC
  Administered 2016-10-26: 12.5 mg via INTRAVENOUS
  Filled 2016-10-26: qty 1

## 2016-10-26 MED ORDER — ONDANSETRON HCL 4 MG/2ML IJ SOLN
INTRAMUSCULAR | Status: DC | PRN
  Administered 2016-10-26: 4 mg via INTRAVENOUS

## 2016-10-26 MED ORDER — FENTANYL CITRATE (PF) 100 MCG/2ML IJ SOLN
25.0000 ug | INTRAMUSCULAR | Status: DC | PRN
  Administered 2016-10-26 (×2): 50 ug via INTRAVENOUS

## 2016-10-26 MED ORDER — DOCUSATE SODIUM 100 MG PO CAPS
100.0000 mg | ORAL_CAPSULE | Freq: Two times a day (BID) | ORAL | 0 refills | Status: DC
Start: 2016-10-26 — End: 2016-11-09

## 2016-10-26 MED ORDER — LIDOCAINE HCL (CARDIAC) 20 MG/ML IV SOLN
INTRAVENOUS | Status: DC | PRN
  Administered 2016-10-26: 100 mg via INTRAVENOUS

## 2016-10-26 MED ORDER — PROPOFOL 10 MG/ML IV BOLUS
INTRAVENOUS | Status: DC | PRN
  Administered 2016-10-26: 200 mg via INTRAVENOUS

## 2016-10-26 MED ORDER — FENTANYL CITRATE (PF) 100 MCG/2ML IJ SOLN
INTRAMUSCULAR | Status: AC
  Filled 2016-10-26: qty 2

## 2016-10-26 MED ORDER — IOTHALAMATE MEGLUMINE 43 % IV SOLN
INTRAVENOUS | Status: DC | PRN
  Administered 2016-10-26: 20 mL

## 2016-10-26 MED ORDER — SODIUM CHLORIDE 0.9 % IJ SOLN
INTRAMUSCULAR | Status: AC
  Filled 2016-10-26: qty 20

## 2016-10-26 MED ORDER — FENTANYL CITRATE (PF) 100 MCG/2ML IJ SOLN
INTRAMUSCULAR | Status: DC | PRN
  Administered 2016-10-26 (×4): 25 ug via INTRAVENOUS

## 2016-10-26 MED ORDER — MIDAZOLAM HCL 2 MG/2ML IJ SOLN
INTRAMUSCULAR | Status: AC
  Filled 2016-10-26: qty 2

## 2016-10-26 MED ORDER — KETOROLAC TROMETHAMINE 30 MG/ML IJ SOLN
INTRAMUSCULAR | Status: DC | PRN
  Administered 2016-10-26: 30 mg via INTRAVENOUS

## 2016-10-26 MED ORDER — KETOROLAC TROMETHAMINE 30 MG/ML IJ SOLN
30.0000 mg | Freq: Once | INTRAMUSCULAR | Status: AC
  Administered 2016-10-26: 30 mg via INTRAVENOUS
  Filled 2016-10-26: qty 1

## 2016-10-26 MED ORDER — SCOPOLAMINE 1 MG/3DAYS TD PT72
MEDICATED_PATCH | TRANSDERMAL | Status: AC
  Filled 2016-10-26: qty 1

## 2016-10-26 MED ORDER — PROMETHAZINE HCL 25 MG/ML IJ SOLN
INTRAMUSCULAR | Status: AC
  Filled 2016-10-26: qty 1

## 2016-10-26 MED ORDER — PROMETHAZINE HCL 25 MG/ML IJ SOLN
INTRAMUSCULAR | Status: AC
  Administered 2016-10-26: 6.25 mg via INTRAVENOUS
  Filled 2016-10-26: qty 1

## 2016-10-26 MED ORDER — PROPOFOL 10 MG/ML IV BOLUS
INTRAVENOUS | Status: AC
  Filled 2016-10-26: qty 20

## 2016-10-26 MED ORDER — ONDANSETRON HCL 4 MG/2ML IJ SOLN
INTRAMUSCULAR | Status: AC
Start: 2016-10-26 — End: 2016-10-26
  Administered 2016-10-26: 4 mg
  Filled 2016-10-26: qty 2

## 2016-10-26 MED ORDER — LACTATED RINGERS IV SOLN
INTRAVENOUS | Status: DC
  Administered 2016-10-26: 09:00:00 via INTRAVENOUS

## 2016-10-26 MED ORDER — ONDANSETRON HCL 4 MG/2ML IJ SOLN
4.0000 mg | Freq: Once | INTRAMUSCULAR | Status: AC
  Administered 2016-10-26: 4 mg via INTRAVENOUS
  Filled 2016-10-26: qty 2

## 2016-10-26 MED ORDER — FENTANYL CITRATE (PF) 100 MCG/2ML IJ SOLN
INTRAMUSCULAR | Status: AC
  Administered 2016-10-26: 50 ug via INTRAVENOUS
  Filled 2016-10-26: qty 2

## 2016-10-26 MED ORDER — MIDAZOLAM HCL 2 MG/2ML IJ SOLN
INTRAMUSCULAR | Status: DC | PRN
Start: 2016-10-26 — End: 2016-10-26
  Administered 2016-10-26: 2 mg via INTRAVENOUS

## 2016-10-26 MED ORDER — PROMETHAZINE HCL 25 MG RE SUPP: 25.0000 mg | Freq: Four times a day (QID) | RECTAL | 1 refills | Status: DC | PRN

## 2016-10-26 MED ORDER — SODIUM CHLORIDE 0.9 % IV BOLUS (SEPSIS)
500.0000 mL | Freq: Once | INTRAVENOUS | Status: AC
  Administered 2016-10-26: 500 mL via INTRAVENOUS

## 2016-10-26 SURGICAL SUPPLY — 32 items
BAG DRAIN CYSTO-URO LG1000N (MISCELLANEOUS) ×3 IMPLANT
BASKET ZERO TIP 1.9FR (BASKET) ×1 IMPLANT
BSKT STON RTRVL ZERO TP 1.9FR (BASKET) ×2
CATH URETL 5X70 OPEN END (CATHETERS) ×3 IMPLANT
CNTNR SPEC 2.5X3XGRAD LEK (MISCELLANEOUS) ×2
CONRAY 43 FOR UROLOGY 50M (MISCELLANEOUS) ×3 IMPLANT
CONT SPEC 4OZ STER OR WHT (MISCELLANEOUS) ×1
CONT SPEC 4OZ STRL OR WHT (MISCELLANEOUS) ×2
CONTAINER SPEC 2.5X3XGRAD LEK (MISCELLANEOUS) IMPLANT
DRAPE UTILITY 15X26 TOWEL STRL (DRAPES) ×3 IMPLANT
FIBER LASER LITHO 273 (Laser) ×1 IMPLANT
GLOVE BIO SURGEON STRL SZ 6.5 (GLOVE) ×3 IMPLANT
GOWN STRL REUS W/ TWL LRG LVL3 (GOWN DISPOSABLE) ×4 IMPLANT
GOWN STRL REUS W/TWL LRG LVL3 (GOWN DISPOSABLE) ×6
GUIDEWIRE SUPER STIFF (WIRE) ×3 IMPLANT
INFUSOR MANOMETER BAG 3000ML (MISCELLANEOUS) ×3 IMPLANT
INTRODUCER DILATOR DOUBLE (INTRODUCER) ×1 IMPLANT
KIT RM TURNOVER CYSTO AR (KITS) ×3 IMPLANT
PACK CYSTO AR (MISCELLANEOUS) ×3 IMPLANT
SCRUB POVIDONE IODINE 4 OZ (MISCELLANEOUS) ×1 IMPLANT
SENSORWIRE 0.038 NOT ANGLED (WIRE) ×3
SET CYSTO W/LG BORE CLAMP LF (SET/KITS/TRAYS/PACK) ×3 IMPLANT
SHEATH URETERAL 12FRX35CM (MISCELLANEOUS) IMPLANT
SOL .9 NS 3000ML IRR  AL (IV SOLUTION) ×1
SOL .9 NS 3000ML IRR AL (IV SOLUTION) ×2
SOL .9 NS 3000ML IRR UROMATIC (IV SOLUTION) ×2 IMPLANT
STENT URET 6FRX24 CONTOUR (STENTS) ×1 IMPLANT
STENT URET 6FRX26 CONTOUR (STENTS) IMPLANT
SURGILUBE 2OZ TUBE FLIPTOP (MISCELLANEOUS) ×3 IMPLANT
SYRINGE IRR TOOMEY STRL 70CC (SYRINGE) ×3 IMPLANT
WATER STERILE IRR 1000ML POUR (IV SOLUTION) ×3 IMPLANT
WIRE SENSOR 0.038 NOT ANGLED (WIRE) ×2 IMPLANT

## 2016-10-26 NOTE — Anesthesia Post-op Follow-up Note (Cosign Needed)
Anesthesia QCDR form completed.        

## 2016-10-26 NOTE — Discharge Instructions (Signed)
Return to the emergency room for any new or worrisome symptoms including increased pain, fever, pain with urination, persistent vomiting or any other concerns. Follow closely with urology tomorrow.

## 2016-10-26 NOTE — Interval H&P Note (Signed)
History and Physical Interval Note:  10/26/2016 8:53 AM  Brandi Bell  has presented today for surgery, with the diagnosis of left ureteral stone  The various methods of treatment have been discussed with the patient and family. After consideration of risks, benefits and other options for treatment, the patient has consented to  Procedure(s): URETEROSCOPY WITH HOLMIUM LASER LITHOTRIPSY (Left) CYSTOSCOPY WITH STENT PLACEMENT (Left) as a surgical intervention .  The patient's history has been reviewed, patient examined, no change in status, stable for surgery.  I have reviewed the patient's chart and labs.  Questions were answered to the patient's satisfaction.    RRR CTAB  Hollice Espy

## 2016-10-26 NOTE — Op Note (Signed)
Date of procedure: 10/26/16  Preoperative diagnosis:  1.  Left distal ureteral stone 2. Left nephrolithiasis 3. Bilateral flank pain  Postoperative diagnosis:  1. Same as above   Procedure: 1. Left ureteroscopy 2. Left laser lithotripsy 3. Left basket extraction of stone fragments  4. Left ureteral stent placement 5. Bilateral retrograde pyelogram  Surgeon: Hollice Espy, MD  Anesthesia: General  Complications: None  Intraoperative findings: Normal bilateral retrograde pyelograms bilaterally without hydroureteronephrosis. No findings on the right suggest obstructing stone. 5 mm left distal ureteral stone treated as well as several small nonobstructing left renal calculi.   EBL: Minimal   Specimens: stone fragement  Drains: 6 x 24 French double-J ureteral stent on left   Indication: Brandi Bell is a 56 y.o. patient with gross hematuria found to have a 5 mm left distal ureteral obstructing calculus which failed to pass spontaneously.   in the preoperative holding area, she also mentioned that she isn't having right flank pain of unclear etiology. No ureteral calculus was appreciated on previous imaging on the right. After reviewing the management options for treatment, she elected to proceed with the above surgical procedure(s). We have discussed the potential benefits and risks of the procedure, side effects of the proposed treatment, the likelihood of the patient achieving the goals of the procedure, and any potential problems that might occur during the procedure or recuperation. Informed consent has been obtained.  Description of procedure:  The patient was taken to the operating room and general anesthesia was induced.  The patient was placed in the dorsal lithotomy position, prepped and draped in the usual sterile fashion, and preoperative antibiotics were administered. A preoperative time-out was performed.   56 French cystoscope was advanced per urethra into the  bladder. Attention was turned to the right ureteral orifice which was cannulated using a 5 [place] open-ended ureteral catheter. A gentle retropyelogram is performed on the side revealing no ureteral filling defects or any hydroureteronephrosis suggestive obstructing stone on the side.  Attention was then turned to the left ureteral orifice which was also cannulated using a 5 [place] open-ended ureteral catheter. Gentle retrograde pyelogram was performed on this side revealing a small filling defect within the left distal ureter consistent with her known stone. There is minimal hydronephrosis on the side. Wire was then placed up to level of the kidney without difficulty. A 4.5 French semirigid ureteroscope was then advanced alongside of the wire up to level of the stone. A 273  laser fiber was using the settings of 0.8 J and 10 Hz to fragment the stone into approximately 5 or 6 smaller pieces. Each of these pieces was then extracted using a 1.9 [place] to plus nitinol basket. The Super Stiff wire was then advanced through the scope up to level of the kidney to be used as a working wire.  A flexible 7 [place] Wolf ureteroscope was then advanced over this Super Stiff wire up to level of the renal pelvis. A few small stones were encountered in the upper pole, mid pole, and lower pole calyces. The laser fiber was used using the settings appear at 0.2 J and 40 Hz to dust the stones into very small fragments smaller than the tip of the laser fiber. The largest 2 fragments were extracted using a nitinol basket. Each time upon reintroducing the scope, a dual lumen access sheath was used within the distal ureter to reintroduce the Super Stiff wire and the scope was advanced over this back to the level  of the renal pelvis. Finally, once the collecting system was adequately cleared of all stone burden, a retrograde pyelogram was performed at the level of the UPJ to create a roadmap of the kidney. Again, each and every calyx was  strictly visualized. The scope was then backed down the length of the ureter inspecting along the way. There was no ureteral injury appreciated and no evidence of residual stone fragments. A 6 x 24 French double-J ureteral stent was advanced over the wire up to level of the renal pelvis. The wire was partially drawn until full coil still within the renal pelvis. The wire was then fully withdrawn and full coil stone within the bladder. No string was left on the stent. The bladder was drained using the cystoscope sheath. She was then cleaned and dried, repositioned the supine position, reversed anesthesia, taken the PACU in stable condition.  Plan: Patient will follow-up next week for cystoscopy, stent removal.   Hollice Espy, M.D.

## 2016-10-26 NOTE — Anesthesia Preprocedure Evaluation (Signed)
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)  (-) CAD, (-) Cardiac Stents and (-) CABG (-) dysrhythmias + Valvular Problems/Murmurs      Neuro/Psych  Headaches, neg Seizures PSYCHIATRIC DISORDERS (Bipolar and PTSD) negative psych ROS   GI/Hepatic Neg liver ROS, GERD  ,  Endo/Other  neg diabetesMorbid obesity  Renal/GU Renal disease (kidney stones)  negative genitourinary   Musculoskeletal   Abdominal   Peds  Hematology  (+) Blood dyscrasia, anemia ,   Anesthesia Other Findings Past Medical History: No date: Anemia No date: Anxiety No date: Arthritis     Comment: osteo Back, Feet, Hands No date: Asthma     Comment: [place] admission - on Bipap - 7/13 - report on               paper chart/ flare 3 mos ago No date: Bipolar disorder ([place]) No date: Bipolar disorder ([place]) No date: Bulging lumbar disc No date: Cancer [facility])     Comment: cervical No date: Cataract No date: Depression No date: Elevated LFTs     Comment: having ultrasound [facility] - 12/14/14 No date: GERD (gastroesophageal reflux disease) No date: Headache     Comment: migraines, every three or 4 days/ stress               related No date: Heart attack ([place]) No date: Heart murmur     Comment: 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, self               repaired No date: History of kidney stones No date: History of  migraine headaches No date: Hypertension     Comment: Echo -1/16 - report on paper chart/controlled               on meds No date: IBS (irritable bowel syndrome) No date: Microhematuria No date: Motion sickness No date: Osteoporosis No date: Pneumonia     Comment: hx of No date: PONV (postoperative nausea and vomiting) No date: PTSD (post-traumatic stress disorder) No date: Restless leg syndrome No date: Rhinitis No date: Shortness of breath dyspnea     Comment: with exertion No date: Spinal stenosis of cervical region No date: Suicide attempt ([place]) 6 yrs ago: Vertigo     Comment: hx of   Reproductive/Obstetrics negative OB ROS                             Anesthesia Physical Anesthesia Plan  ASA: III  Anesthesia Plan: General   Post-op Pain Management:    Induction:   Airway Management Planned:   Additional Equipment:   Intra-op Plan:   Post-operative Plan:   Informed Consent: I have reviewed the patients History and Physical, chart, labs and discussed the procedure including the risks, benefits and alternatives for the proposed anesthesia with the patient or authorized representative who  has indicated his/her understanding and acceptance.   Dental Advisory Given  Plan Discussed with: Anesthesiologist, CRNA and Surgeon  Anesthesia Plan Comments:         Anesthesia Quick Evaluation

## 2016-10-26 NOTE — ED Triage Notes (Signed)
Pt brought in via ems from home with n/v and left side flank pain.  Pt had urinary stent placed today.  Pt states unable to eat or drink since procedure.  Pt alert

## 2016-10-26 NOTE — H&P (View-Only) (Signed)
   10/20/16  CC:  Chief Complaint  Patient presents with  . Cysto    results    HPI: 56 year old female who presents today for cystoscopy for further evaluation of severe gross hematuria with clots. She previously underwent CT urogram on 09/22/2016 found have an incidental left still ureteral stone, 4 mm in caliber which appears to be nonobstructing as well as other smaller nonobstructing calculi.  She underwent follow-up CT scan ordered by her PCP on 10/04/2016 and her stone persists. Is also appreciated on KUB from 10/14/2016.  No other GU pathology was appreciated on CT urogram.  She denies of her flank pain.  She is a nonsmoker.  She does have a prior history of nephrolithiasis which she has passed spontaneously.  Her stone composition is unknown at this time.  She has not passed a stone in over 5 years.  She did have A/P repair with bladder sling about 5 years ago.  She does not have a history of trauma to the genitourinary tract or malignancies of the genitourinary tract  Blood pressure (!) 145/92, pulse 91, height 5\' 1"  (1.549 m), weight 212 lb (96.2 kg). NED. A&Ox3.   No respiratory distress   Abd soft, NT, ND Normal external genitalia with patent urethral meatus  Cystoscopy Procedure Note  Patient identification was confirmed, informed consent was obtained, and patient was prepped using Betadine solution.  Lidocaine jelly was administered per urethral meatus.    Preoperative abx where received prior to procedure.    Procedure: - Flexible cystoscope introduced, without any difficulty.   - Thorough search of the bladder revealed:    normal urethral meatus    normal urothelium    no stones    no ulcers     no tumors    no urethral polyps    no trabeculation  - Ureteral orifices were normal in position and appearance.  Post-Procedure: - Patient tolerated the procedure well  Assessment/ Plan:  1) Left ureteral stone- Retained ureteral stone, recommend  intervention.   Risks and benefits of ureteroscopy were reviewed including but not limited to infection, bleeding, pain, ureteral injury which could require open surgery versus prolonged indwelling if ureteralperforation occurs, persistent stone disease, requirement for staged procedure, possible stent, and global anesthesia risks. Patient expressed understanding and desires to proceed with ureteroscopy.  2) Hematuria- Cysto negative.  Likely secondary #1.    Hollice Espy, MD

## 2016-10-26 NOTE — Discharge Instructions (Signed)

## 2016-10-26 NOTE — Progress Notes (Signed)
No redness or swelling at IV site   No further complaints of nausea

## 2016-10-26 NOTE — ED Notes (Signed)
Pt reports left flank pain with nausea. Pt took zofran at home with minimal relief.  Pt has bloody urine after having stent inserted today by dr Erlene Quan.  Iv started and meds given.  Family with pt.  md at bedside.

## 2016-10-26 NOTE — Transfer of Care (Signed)
Immediate Anesthesia Transfer of Care Note  Patient: Brandi Bell  Procedure(s) Performed: Procedure(s): URETEROSCOPY WITH HOLMIUM LASER LITHOTRIPSY (Left) CYSTOSCOPY WITH STENT PLACEMENT (Left) CYSTOSCOPY WITH RETROGRADE PYELOGRAM (Right)  Patient Location: PACU  Anesthesia Type:General  Level of Consciousness: awake and alert   Airway & Oxygen Therapy: Patient Spontanous Breathing  Post-op Assessment: Report given to RN  Post vital signs: Reviewed and stable  Last Vitals:  Vitals:   10/26/16 0818  BP: 123/75  Pulse: 68  Resp: 18  Temp: 36.8 C    Last Pain:  Vitals:   10/26/16 0818  PainSc: 2          Complications: No apparent anesthesia complications

## 2016-10-26 NOTE — ED Provider Notes (Addendum)
[facility] Emergency Department Provider Note  ____________________________________________   I have reviewed the triage vital signs and the nursing notes.   HISTORY  Chief Complaint Flank Pain    HPI Brandi Bell is a 56 y.o. female who presents today complaining of pain to her left flank where she had a stent placed earlier today by Dr. Tobe Sos. Patient had kidney stones. She was placed on pain medication and nausea medication however is been vomiting. She is very anxious and upset. Denies fever. Does have hematuria as expected.     Past Medical History:  Diagnosis Date  . Anemia   . Anxiety   . Arthritis    osteo Back, Feet, Hands  . Asthma    [facility] admission - on Bipap - 7/13 - report on paper chart/ flare 3 mos ago  . Bipolar disorder ([place])   . Bipolar disorder ([place])   . Bulging lumbar disc   . Cancer (HCC)    cervical  . Cataract   . Depression   . Elevated LFTs    having ultrasound [facility] - 12/14/14  . GERD (gastroesophageal reflux disease)   . Headache    migraines, every three or 4 days/ stress related  . Heart attack ([place])   . 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 History of kidney stones   . History of migraine headaches   . Hypertension    Echo -1/16 - report on paper chart/controlled on meds  . IBS (irritable bowel syndrome)   . Microhematuria   . Motion sickness   . Osteoporosis   . Pneumonia    hx of  . PONV (postoperative nausea and vomiting)   . PTSD (post-traumatic stress disorder)   . Restless leg syndrome   . Rhinitis   . Shortness of breath dyspnea    with exertion  . Spinal stenosis of cervical region   . Suicide attempt ([place])   . Vertigo 6 yrs ago   hx of    Patient Active Problem List   Diagnosis Date Noted  . Vitamin D deficiency 07/21/2016  . Suicide attempt ([place]) 07/19/2016  . PTSD (post-traumatic stress disorder) 07/19/2016  . Migraine headache 07/19/2016  . RLS  (restless legs syndrome) 07/01/2016  . Osteoporosis 07/01/2016  . Trochanteric bursitis 03/17/2016  . Prediabetes 03/17/2016  . Gastritis   . Pituitary adenoma ([place]) 10/02/2015  . Borderline personality disorder 02/10/2015  . HTN (hypertension) 02/10/2015  . Chronic pain syndrome 02/10/2015  . Arthritis 02/10/2015  . Hx of colonic polyps   . Seasonal allergies 12/21/2013  . Fibromyalgia 12/21/2013  . Moderate persistent asthma without complication 71/24/5809  . Chronic nausea 06/20/2013  . Bipolar disorder ([place]) 06/20/2013  . Malignant neoplasm of cervix ([place]) 07/20/2012  . Lumbar herniated disc 07/20/2012  . Hyperlipidemia 07/20/2012  . 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 Breast mass 07/20/2012    Past Surgical History:  Procedure Laterality Date  . ANTERIOR AND POSTERIOR REPAIR     done with bladder tact  . BILATERAL SALPINGOOPHORECTOMY  2004   pain from scar tissue  . bladder tact    . CARDIAC CATHETERIZATION  2008   [facility] - neg - report in paper chart  . CATARACT EXTRACTION Bilateral 2007  . CHOLECYSTECTOMY    . COLONOSCOPY N/A 01/04/2015   Procedure: COLONOSCOPY;  Surgeon: Lucilla Lame, MD;  Location: [place];  Service: Gastroenterology;  Laterality: N/A;  with  biopsies  . CYSTOSCOPY W/ RETROGRADES Right 10/26/2016   Procedure: CYSTOSCOPY WITH RETROGRADE PYELOGRAM;  Surgeon: Hollice Espy, MD;  Location: [facility];  Service: Urology;  Laterality: Right;  . CYSTOSCOPY WITH STENT PLACEMENT Left 10/26/2016   Procedure: CYSTOSCOPY WITH STENT PLACEMENT;  Surgeon: Hollice Espy, MD;  Location: [facility];  Service: Urology;  Laterality: Left;  . ESOPHAGOGASTRODUODENOSCOPY (EGD) WITH PROPOFOL N/A 12/30/2015   Procedure: ESOPHAGOGASTRODUODENOSCOPY (EGD) WITH PROPOFOL;  Surgeon: Lucilla Lame, MD;  Location: [place];  Service: Endoscopy;  Laterality: N/A;  . FOOT SURGERY     multiple  . SEPTOPLASTY    . SHOULDER ARTHROSCOPY Bilateral 2008  .  TONSILLECTOMY  6  . TUBAL LIGATION    . URETEROSCOPY WITH HOLMIUM LASER LITHOTRIPSY Left 10/26/2016   Procedure: URETEROSCOPY WITH HOLMIUM LASER LITHOTRIPSY;  Surgeon: Hollice Espy, MD;  Location: [facility];  Service: Urology;  Laterality: Left;  Marland Kitchen VAGINAL HYSTERECTOMY  1989   cervical cancer age 58    Prior to Admission medications   Medication Sig Start Date End Date Taking? Authorizing Provider  albuterol (PROVENTIL HFA;VENTOLIN HFA) 108 (90 Base) MCG/ACT inhaler Inhale 1-2 puffs into the lungs every 6 (six) hours as needed for wheezing or shortness of breath. 10/06/16   Glean Hess, MD  ARIPiprazole (ABILIFY) 15 MG tablet Take 15 mg by mouth every morning.    Historical Provider, MD  Calcium Citrate-Vitamin D (CALCIUM CITRATE + PO) Take 600 mg elemental calcium/kg/hr by mouth 2 (two) times daily.    Historical Provider, MD  Cholecalciferol (VITAMIN D3) 2000 units TABS Take 2,000 Units by mouth daily.     Historical Provider, MD  clonazePAM (KLONOPIN) 0.5 MG tablet Take 0.5 mg by mouth 2 (two) times daily. Am and pm    Historical Provider, MD  docusate sodium (COLACE) 100 MG capsule Take 1 capsule (100 mg total) by mouth 2 (two) times daily. 10/26/16   Hollice Espy, MD  esomeprazole (NEXIUM) 40 MG capsule Take 1 capsule (40 mg total) by mouth daily before breakfast. 06/01/16   Lucilla Lame, MD  HYDROcodone-acetaminophen (NORCO/VICODIN) 5-325 MG tablet Take 1-2 tablets by mouth every 6 (six) hours as needed for moderate pain. 10/26/16   Hollice Espy, MD  ipratropium-albuterol (DUONEB) 0.5-2.5 (3) MG/3ML SOLN Inhale 3 mL by nebulization every six (6) hours as needed. 07/03/16 07/03/17  Historical Provider, MD  modafinil (PROVIGIL) 200 MG tablet Take 200 mg by mouth 2 (two) times daily. Each Morning and at [place] Provider, MD  mometasone (NASONEX) 50 MCG/ACT nasal spray Place 2 sprays into the nose every morning.  06/12/16   Historical Provider, MD  Multiple Vitamins-Minerals  (MULTIVITAMIN ADULT PO) Take 1 tablet by mouth daily.     Historical Provider, MD  ondansetron (ZOFRAN-ODT) 8 MG disintegrating tablet Take 8 mg by mouth every 8 (eight) hours as needed for nausea or vomiting.    Historical Provider, MD  oxybutynin (DITROPAN) 5 MG tablet Take 1 tablet (5 mg total) by mouth every 8 (eight) hours as needed for bladder spasms. 10/26/16   Hollice Espy, MD  oxyCODONE-acetaminophen (PERCOCET/ROXICET) 5-325 MG tablet Take 1 tablet by mouth every 4 (four) hours as needed for moderate pain or severe pain.    Historical Provider, MD  rizatriptan (MAXALT-MLT) 10 MG disintegrating tablet Take 1 tablet (10 mg total) by mouth as needed for migraine. May repeat in 2 hours if needed 10/06/16   Glean Hess, MD  rOPINIRole (REQUIP) 3 MG tablet Take  3 mg by mouth at bedtime.    Historical Provider, MD  terazosin (HYTRIN) 5 MG capsule Take 1 capsule (5 mg total) by mouth at bedtime. 10/26/16   Hollice Espy, MD  traMADol (ULTRAM) 50 MG tablet Take 100 mg by mouth at bedtime.     Historical Provider, MD  valACYclovir (VALTREX) 500 MG tablet Take 500 mg by mouth at bedtime.     Historical Provider, MD  vortioxetine HBr (TRINTELLIX) 10 MG TABS Take 10 mg by mouth every morning.     Historical Provider, MD    Allergies Ciprofloxacin; Tapentadol; Amoxicillin; Morphine and related; Pineapple; Sulfa antibiotics; Tape; and Other  Family History  Problem Relation Age of Onset  . Asthma Mother   . Diabetes Mother   . Breast cancer Mother   . Melanoma Mother   . Heart attack Father 45  . Breast cancer Sister   . Brain cancer Brother   . Kidney cancer Neg Hx   . Bladder Cancer Neg Hx   . Prostate cancer Neg Hx     Social History Social History  Substance Use Topics  . Smoking status: Never Smoker  . Smokeless tobacco: Never Used  . Alcohol use No    Review of Systems Constitutional: No fever/chills Eyes: No visual changes. ENT: No sore throat. No stiff neck no neck  pain Cardiovascular: Denies chest pain. Respiratory: Denies shortness of breath. Gastrointestinal:  Positive vomiting  No diarrhea.  No constipation. Genitourinary: Negative for dysuria. Musculoskeletal: Negative lower extremity swelling Skin: Negative for rash. Neurological: Negative for severe headaches, focal weakness or numbness. 10-point ROS otherwise negative.  ____________________________________________   PHYSICAL EXAM:  VITAL SIGNS: ED Triage Vitals  Enc Vitals Group     BP 10/26/16 2005 139/63     Pulse Rate 10/26/16 2007 61     Resp 10/26/16 2007 20     Temp 10/26/16 2007 98.3 F (36.8 C)     Temp src --      SpO2 10/26/16 2007 99 %     Weight 10/26/16 2005 215 lb (97.5 kg)     Height 10/26/16 2005 5\' 1"  (1.549 m)     Head Circumference --      Peak Flow --      Pain Score 10/26/16 2005 5     Pain Loc --      Pain Edu? --      Excl. in [place]? --     Constitutional: Alert and oriented. He is nontoxic but appears as if she is uncomfortable Eyes: Conjunctivae are normal. PERRL. EOMI. Head: Atraumatic. Nose: No congestion/rhinnorhea. Mouth/Throat: Mucous membranes are moist.  Oropharynx non-erythematous. Neck: No stridor.   Nontender with no meningismus Cardiovascular: Normal rate, regular rhythm. Grossly normal heart sounds.  Good peripheral circulation. Respiratory: Normal respiratory effort.  No retractions. Lungs CTAB. Abdominal: Soft and nontender. No distention. No guarding no rebound Back:  There is no focal tenderness or step off.  there is no midline tenderness there are no lesions noted. there is positive left CVA tenderness Musculoskeletal: No lower extremity tenderness, no upper extremity tenderness. No joint effusions, no DVT signs strong distal pulses no edema Neurologic:  Normal speech and language. No gross focal neurologic deficits are appreciated.  Skin:  Skin is warm, dry and intact. No rash noted. Psychiatric: Mood and affect are anxious. Speech  and behavior are normal.  ____________________________________________   LABS (all labs ordered are listed, but only abnormal results are displayed)  Labs Reviewed  CBC  WITH DIFFERENTIAL/PLATELET - Abnormal; Notable for the following:       Result Value   WBC 12.0 (*)    Neutro Abs 9.7 (*)    All other components within normal limits  URINALYSIS, COMPLETE (UACMP) WITH MICROSCOPIC - Abnormal; Notable for the following:    Color, Urine YELLOW (*)    APPearance CLOUDY (*)    pH 9.0 (*)    Hgb urine dipstick LARGE (*)    Ketones, ur 5 (*)    Protein, ur 100 (*)    Squamous Epithelial / LPF 0-5 (*)    All other components within normal limits  BASIC METABOLIC PANEL - Abnormal; Notable for the following:    Chloride 100 (*)    Glucose, Bld 120 (*)    All other components within normal limits  URINE CULTURE   ____________________________________________  EKG  I personally interpreted any EKGs ordered by me or triage  ____________________________________________  RADIOLOGY  I reviewed any imaging ordered by me or triage that were performed during my shift and, if possible, patient and/or family made aware of any abnormal findings. ____________________________________________   PROCEDURES  Procedure(s) performed: None  Procedures  Critical Care performed: None  ____________________________________________   INITIAL IMPRESSION / ASSESSMENT AND PLAN / ED COURSE  Pertinent labs & imaging results that were available during my care of the patient were reviewed by me and considered in my medical decision making (see chart for details).  Patient with nausea and abdominal pain after stent placement. Kidney function reassuring, vital signs reassuring, urinalysis are reassuring, exam is reassuring, I discussed with Dr. Junious Silk who is on-call for Dr. Marca Ancona. He feels that Toradol would also the patient's pain. He does not want a CT scan. It is his hope that we can get her  feeling better and they will closely follow up as an outpatient. He and I discussed the patient's recent procedure, lab findings, vital signs exam and complaint etc. ptfeeling better after fentanyl. We will try Toradol at urology's request.  ----------------------------------------- 9:33 PM on 10/26/2016 ----------------------------------------- Patient reassured as I am by all of her findings. She has not vomited since she's been here but she states the nausea is "starting to come back". She is worried about going home without adequate nausea control. She did take Zofran ODT at home. We will try another antiemetics and if they can get her feeling better we'll discharge her.     ____________________________________________   FINAL CLINICAL IMPRESSION(S) / ED DIAGNOSES  Final diagnoses:  None      This chart was dictated using voice recognition software.  Despite best efforts to proofread,  errors can occur which can change meaning.      Schuyler Amor, MD 10/26/16 3570    Schuyler Amor, MD 10/26/16 [id number]

## 2016-10-26 NOTE — Anesthesia Procedure Notes (Signed)
Procedure Name: LMA Insertion Date/Time: 10/26/2016 9:26 AM Performed by: Hedda Slade Pre-anesthesia Checklist: Patient identified, Patient being monitored, Timeout performed, Emergency Drugs available and Suction available Patient Re-evaluated:Patient Re-evaluated prior to inductionOxygen Delivery Method: Circle system utilized Preoxygenation: Pre-oxygenation with 100% oxygen Intubation Type: IV induction Ventilation: Mask ventilation without difficulty LMA: LMA inserted LMA Size: 4.0 Tube type: Oral Number of attempts: 1 Placement Confirmation: positive ETCO2 and breath sounds checked- equal and bilateral Tube secured with: Tape Dental Injury: Teeth and Oropharynx as per pre-operative assessment

## 2016-10-26 NOTE — Anesthesia Postprocedure Evaluation (Signed)
Anesthesia Post Note  Patient: Colletta Spillers  Procedure(s) Performed: Procedure(s) (LRB): URETEROSCOPY WITH HOLMIUM LASER LITHOTRIPSY (Left) CYSTOSCOPY WITH STENT PLACEMENT (Left) CYSTOSCOPY WITH RETROGRADE PYELOGRAM (Right)  Patient location during evaluation: PACU Anesthesia Type: General Level of consciousness: awake and alert Pain management: pain level controlled Vital Signs Assessment: post-procedure vital signs reviewed and stable Respiratory status: spontaneous breathing, nonlabored ventilation, respiratory function stable and patient connected to nasal cannula oxygen Cardiovascular status: blood pressure returned to baseline and stable Postop Assessment: no signs of nausea or vomiting Anesthetic complications: no     Last Vitals:  Vitals:   10/26/16 1132 10/26/16 1348  BP: (!) 155/90 (!) 159/79  Pulse: 65 (!) 55  Resp: 18   Temp: 36.5 C     Last Pain:  Vitals:   10/26/16 1132  PainSc: 3                  Martha Clan

## 2016-10-28 LAB — URINE CULTURE: Culture: NO GROWTH

## 2016-10-29 ENCOUNTER — Telehealth: Payer: Self-pay

## 2016-10-29 ENCOUNTER — Encounter: Payer: Self-pay | Admitting: Urology

## 2016-10-29 DIAGNOSIS — N200 Calculus of kidney: Secondary | ICD-10-CM

## 2016-10-29 MED ORDER — OXYCODONE-ACETAMINOPHEN 5-325 MG PO TABS: 1.0000 | ORAL_TABLET | ORAL | 0 refills | Status: DC | PRN

## 2016-10-29 NOTE — Telephone Encounter (Signed)
Sounds terrible!  Sorry that she has had to deal with this.  There are a certain population of folks that just don't tolerate the stent well and I think she may be one of them.  Alternately, the pain meds themselves could be causing nausea.    Lets try switching to a different narcotic, like percocet 5/235 take 1-2 tabs q4-6 hours disp #10  Does she have a script for Zofran?  If not, please prescribe Zofran 4 mg q4 hours prn #10.  Hollice Espy, MD

## 2016-10-29 NOTE — Telephone Encounter (Signed)
Spoke with pt in reference to vomiting post stent placement. Made pt aware can switch pain medication and to take zofran. Pt voiced understanding. Pain script printed and left up front.

## 2016-10-29 NOTE — Telephone Encounter (Signed)
Pt called stating she was in the ER at [facility] the afternoon/night of surgery due to vomiting. Pt stated that she was given IV medications at ER visit that helped but when medication wore off she started vomiting again. Pt stated that she then went to [facility] and was admitted for 2 days. Pt stated that a CT was performed at [facility] that came back negative. Pt stated once again she received IV medications that helped and vomiting stopped. Pt stated that now that she is back home and IV meds have worn off she is starting to vomit again. Pt c/o not being able to keep any post op meds down when not on IV meds. Please advise.

## 2016-10-30 ENCOUNTER — Telehealth: Payer: Self-pay | Admitting: Family Medicine

## 2016-10-30 ENCOUNTER — Encounter: Payer: Self-pay | Admitting: Urology

## 2016-10-30 ENCOUNTER — Telehealth: Payer: Self-pay

## 2016-10-30 NOTE — Telephone Encounter (Signed)
Patient called this afternoon stating her catheter was leaking all down her leg so she deflated the balloon and took it out. She was asking what she should do. Since it was 5:00 and all doctor's left for the weekend, I told her to drink fluids and try to urinate on her own. If she is not successful she needs to go to the ED to have the catheter replaced.

## 2016-10-30 NOTE — Telephone Encounter (Signed)
Pt called stating she went back to the ER at [facility] due to the pain and vomiting. Pt stated that [facility] placed a catheter and is treating her for a UTI. Pt then stated that she will continue to f/u as planned with Dr. Erlene Quan for stent removal.

## 2016-11-01 ENCOUNTER — Encounter: Payer: Self-pay | Admitting: Urology

## 2016-11-02 ENCOUNTER — Encounter: Payer: Self-pay | Admitting: Urology

## 2016-11-02 ENCOUNTER — Ambulatory Visit (INDEPENDENT_AMBULATORY_CARE_PROVIDER_SITE_OTHER): Payer: Medicare Other | Admitting: Urology

## 2016-11-02 VITALS — BP 137/85 | HR 103 | Ht 61.0 in | Wt 210.3 lb

## 2016-11-02 DIAGNOSIS — R109 Unspecified abdominal pain: Secondary | ICD-10-CM

## 2016-11-02 DIAGNOSIS — N201 Calculus of ureter: Secondary | ICD-10-CM | POA: Diagnosis not present

## 2016-11-02 DIAGNOSIS — R350 Frequency of micturition: Secondary | ICD-10-CM

## 2016-11-02 LAB — URINALYSIS, COMPLETE
BILIRUBIN UA: NEGATIVE
GLUCOSE, UA: NEGATIVE
KETONES UA: NEGATIVE
Nitrite, UA: NEGATIVE
Specific Gravity, UA: 1.015 (ref 1.005–1.030)
UUROB: 0.2 mg/dL (ref 0.2–1.0)
pH, UA: 7.5 (ref 5.0–7.5)

## 2016-11-02 LAB — MICROSCOPIC EXAMINATION: Epithelial Cells (non renal): NONE SEEN /hpf (ref 0–10)

## 2016-11-02 MED ORDER — KETOROLAC TROMETHAMINE 60 MG/2ML IM SOLN
30.0000 mg | Freq: Once | INTRAMUSCULAR | Status: AC
  Administered 2016-11-02: 30 mg via INTRAMUSCULAR

## 2016-11-02 MED ORDER — LIDOCAINE HCL 2 % EX GEL
1.0000 "application " | Freq: Once | CUTANEOUS | Status: AC
  Administered 2016-11-02: 1 via URETHRAL

## 2016-11-02 MED ORDER — DOXYCYCLINE HYCLATE 100 MG PO TABS: 100.0000 mg | ORAL_TABLET | Freq: Once | ORAL | Status: DC

## 2016-11-02 NOTE — Telephone Encounter (Signed)
Spoke to patient this morning. She apologized for the message she had sent over the weekend. She went back to the ED and had more IV fluids from her vomiting. She also was taught CIC and she had had to do it a couple time. As of this morning she is doing better all but the bladder spasms. She will be in the office on tomorrow for Stent removal.

## 2016-11-02 NOTE — Progress Notes (Signed)
   11/02/16  CC:  Chief Complaint  Patient presents with  . Cysto Stent Removal    HPI: 56 year old female with retained left distal ureteral stone status post ureteroscopy last week on 10/26/2016 who presents today for cystoscopy, stent removal.  Unfortunately, over the past week, she's not been able to tolerate the stent well. She presented on the same day of the procedure back to the emergency room with nausea and vomiting. She was ultimately admitted for observations at [facility] for pain control. She returned there again in urinary retention at which time a catheter was placed. She was also treated with antibiotics, Macrobid for presumed UTI although urine culture was negative.  She subsequently removed her own catheter on Friday evening without medical advice after leaking a small amount around the catheter likely secondary to spasms.  She has been voiding since that time.  She continues to have flank pain, especially with voiding, urgency, and frequency. She's had an episode of incontinence as well.   Blood pressure 137/85, pulse (!) 103, height 5\' 1"  (1.549 m), weight 210 lb 4.8 oz (95.4 kg). NED. A&Ox3.   No respiratory distress   Abd soft, NT, ND Normal external genitalia with patent urethral meatus  Cystoscopy/ Stent removal procedure  Patient identification was confirmed, informed consent was obtained, and patient was prepped using Betadine solution.  Lidocaine jelly was administered per urethral meatus.    Preoperative abx where received prior to procedure.    Procedure: - Flexible cystoscope introduced, without any difficulty.   - Thorough search of the bladder revealed:    normal urethral meatus  Stent seen emanating from left ureteral orifice, grasped with stent graspers, and removed in entirety.    Post-Procedure: - Patient tolerated the procedure well   Assessment/ Plan:  1. Left ureteral stone Status post ureteroscopy 1 week ago, stent removed  today Partially, she did not tolerate the stent well Status post stent removal today Post stent removal warning symptoms reviewed F/u 4 weeks with RUS  2. Flank pain Given dose of Toradol today in the office, 30 mg IM for pain control following stent removal - Urinalysis, Complete - lidocaine (XYLOCAINE) 2 % jelly 1 application; Place 1 application into the urethra once. - doxycycline (VIBRA-TABS) tablet 100 mg; Take 1 tablet (100 mg total) by mouth once. - US Renal; Future  3. Urinary frequency Secondary to stent Unable to tolerated ditropan Should improved with stent removal  4. Urinary retention Provided with straight caths today  Return in about 4 weeks (around 11/30/2016) for RUS [place]).   Hollice Espy, MD

## 2016-11-02 NOTE — Progress Notes (Signed)
IM Injection  Patient is present today for an IM Injection for treatment of flank pain Drug: Ketorolac Dose:30ML Location:left upper outer Quad Lot: [id number] Exp:01/2018 Patient tolerated well, no complications were noted  Preformed by: Elberta Leatherwood, CMA

## 2016-11-03 ENCOUNTER — Other Ambulatory Visit: Payer: Medicare Other | Admitting: Urology

## 2016-11-04 LAB — STONE ANALYSIS
CA OXALATE, DIHYDRATE: 50 %
Ca Oxalate,Monohydr.: 30 %
Ca phos cry stone ql IR: 20 %
Stone Weight KSTONE: 10.8 mg

## 2016-11-08 ENCOUNTER — Encounter: Payer: Self-pay | Admitting: Gastroenterology

## 2016-11-09 ENCOUNTER — Other Ambulatory Visit: Payer: Self-pay | Admitting: Urology

## 2016-11-09 ENCOUNTER — Telehealth: Payer: Self-pay

## 2016-11-09 ENCOUNTER — Ambulatory Visit
Admission: EM | Admit: 2016-11-09 | Discharge: 2016-11-09 | Disposition: A | Discharge status: Home / Self Care | Payer: Medicare Other | Attending: Family Medicine | Admitting: Family Medicine

## 2016-11-09 DIAGNOSIS — F319 Bipolar disorder, unspecified: Secondary | ICD-10-CM | POA: Diagnosis not present

## 2016-11-09 DIAGNOSIS — K2970 Gastritis, unspecified, without bleeding: Secondary | ICD-10-CM | POA: Insufficient documentation

## 2016-11-09 DIAGNOSIS — R197 Diarrhea, unspecified: Secondary | ICD-10-CM | POA: Diagnosis not present

## 2016-11-09 DIAGNOSIS — E559 Vitamin D deficiency, unspecified: Secondary | ICD-10-CM | POA: Diagnosis not present

## 2016-11-09 DIAGNOSIS — E871 Hypo-osmolality and hyponatremia: Secondary | ICD-10-CM | POA: Diagnosis not present

## 2016-11-09 DIAGNOSIS — Z803 Family history of malignant neoplasm of breast: Secondary | ICD-10-CM | POA: Insufficient documentation

## 2016-11-09 DIAGNOSIS — M1990 Unspecified osteoarthritis, unspecified site: Secondary | ICD-10-CM | POA: Diagnosis not present

## 2016-11-09 DIAGNOSIS — F4310 Post-traumatic stress disorder, unspecified: Secondary | ICD-10-CM | POA: Insufficient documentation

## 2016-11-09 DIAGNOSIS — R3129 Other microscopic hematuria: Secondary | ICD-10-CM | POA: Diagnosis not present

## 2016-11-09 DIAGNOSIS — Z8541 Personal history of malignant neoplasm of cervix uteri: Secondary | ICD-10-CM | POA: Insufficient documentation

## 2016-11-09 DIAGNOSIS — D352 Benign neoplasm of pituitary gland: Secondary | ICD-10-CM | POA: Insufficient documentation

## 2016-11-09 DIAGNOSIS — I10 Essential (primary) hypertension: Secondary | ICD-10-CM | POA: Diagnosis not present

## 2016-11-09 DIAGNOSIS — Z9889 Other specified postprocedural states: Secondary | ICD-10-CM | POA: Insufficient documentation

## 2016-11-09 DIAGNOSIS — Z888 Allergy status to other drugs, medicaments and biological substances status: Secondary | ICD-10-CM | POA: Insufficient documentation

## 2016-11-09 DIAGNOSIS — F603 Borderline personality disorder: Secondary | ICD-10-CM | POA: Diagnosis not present

## 2016-11-09 DIAGNOSIS — R7303 Prediabetes: Secondary | ICD-10-CM | POA: Insufficient documentation

## 2016-11-09 DIAGNOSIS — Z833 Family history of diabetes mellitus: Secondary | ICD-10-CM | POA: Insufficient documentation

## 2016-11-09 DIAGNOSIS — G2581 Restless legs syndrome: Secondary | ICD-10-CM | POA: Insufficient documentation

## 2016-11-09 DIAGNOSIS — K580 Irritable bowel syndrome with diarrhea: Secondary | ICD-10-CM | POA: Insufficient documentation

## 2016-11-09 DIAGNOSIS — Z79899 Other long term (current) drug therapy: Secondary | ICD-10-CM | POA: Insufficient documentation

## 2016-11-09 DIAGNOSIS — G43909 Migraine, unspecified, not intractable, without status migrainosus: Secondary | ICD-10-CM | POA: Diagnosis not present

## 2016-11-09 DIAGNOSIS — E785 Hyperlipidemia, unspecified: Secondary | ICD-10-CM | POA: Diagnosis not present

## 2016-11-09 DIAGNOSIS — Z880 Allergy status to penicillin: Secondary | ICD-10-CM | POA: Insufficient documentation

## 2016-11-09 DIAGNOSIS — J4540 Moderate persistent asthma, uncomplicated: Secondary | ICD-10-CM | POA: Diagnosis not present

## 2016-11-09 DIAGNOSIS — M5126 Other intervertebral disc displacement, lumbar region: Secondary | ICD-10-CM | POA: Insufficient documentation

## 2016-11-09 DIAGNOSIS — Z90710 Acquired absence of both cervix and uterus: Secondary | ICD-10-CM | POA: Insufficient documentation

## 2016-11-09 DIAGNOSIS — M797 Fibromyalgia: Secondary | ICD-10-CM | POA: Diagnosis not present

## 2016-11-09 DIAGNOSIS — Z86010 Personal history of colonic polyps: Secondary | ICD-10-CM | POA: Diagnosis not present

## 2016-11-09 DIAGNOSIS — I252 Old myocardial infarction: Secondary | ICD-10-CM | POA: Insufficient documentation

## 2016-11-09 DIAGNOSIS — N63 Unspecified lump in unspecified breast: Secondary | ICD-10-CM | POA: Diagnosis not present

## 2016-11-09 DIAGNOSIS — K219 Gastro-esophageal reflux disease without esophagitis: Secondary | ICD-10-CM | POA: Insufficient documentation

## 2016-11-09 DIAGNOSIS — Z9049 Acquired absence of other specified parts of digestive tract: Secondary | ICD-10-CM | POA: Insufficient documentation

## 2016-11-09 DIAGNOSIS — G894 Chronic pain syndrome: Secondary | ICD-10-CM | POA: Diagnosis not present

## 2016-11-09 DIAGNOSIS — A6000 Herpesviral infection of urogenital system, unspecified: Secondary | ICD-10-CM | POA: Diagnosis not present

## 2016-11-09 DIAGNOSIS — Z825 Family history of asthma and other chronic lower respiratory diseases: Secondary | ICD-10-CM | POA: Insufficient documentation

## 2016-11-09 LAB — COMPREHENSIVE METABOLIC PANEL
ALT: 23 U/L (ref 14–54)
ANION GAP: 7 (ref 5–15)
AST: 29 U/L (ref 15–41)
Albumin: 4.1 g/dL (ref 3.5–5.0)
Alkaline Phosphatase: 90 U/L (ref 38–126)
BUN: <5 mg/dL — ABNORMAL LOW (ref 6–20)
CHLORIDE: 94 mmol/L — AB (ref 101–111)
CO2: 26 mmol/L (ref 22–32)
CREATININE: 0.56 mg/dL (ref 0.44–1.00)
Calcium: 9.1 mg/dL (ref 8.9–10.3)
GFR calc non Af Amer: >60 mL/min (ref >60)
GLUCOSE: 96 mg/dL (ref 65–99)
Potassium: 3.5 mmol/L (ref 3.5–5.1)
SODIUM: 127 mmol/L — AB (ref 135–145)
Total Bilirubin: 0.5 mg/dL (ref 0.3–1.2)
Total Protein: 7.1 g/dL (ref 6.5–8.1)

## 2016-11-09 LAB — CBC WITH DIFFERENTIAL/PLATELET
Basophils Absolute: 0.1 10*3/uL (ref 0–0.1)
Basophils Relative: 1 %
Eosinophils Absolute: 0 10*3/uL (ref 0–0.7)
Eosinophils Relative: 1 %
HEMATOCRIT: 43.7 % (ref 35.0–47.0)
HEMOGLOBIN: 15 g/dL (ref 12.0–16.0)
LYMPHS ABS: 1.5 10*3/uL (ref 1.0–3.6)
Lymphocytes Relative: 24 %
MCH: 30.5 pg (ref 26.0–34.0)
MCHC: 34.2 g/dL (ref 32.0–36.0)
MCV: 89.1 fL (ref 80.0–100.0)
MONO ABS: 0.7 10*3/uL (ref 0.2–0.9)
MONOS PCT: 11 %
NEUTROS ABS: 4 10*3/uL (ref 1.4–6.5)
NEUTROS PCT: 63 %
Platelets: 265 10*3/uL (ref 150–440)
RBC: 4.91 MIL/uL (ref 3.80–5.20)
RDW: 12.9 % (ref 11.5–14.5)
WBC: 6.4 10*3/uL (ref 3.6–11.0)

## 2016-11-09 LAB — URINALYSIS, COMPLETE (UACMP) WITH MICROSCOPIC
Bilirubin Urine: NEGATIVE
Glucose, UA: NEGATIVE mg/dL
Ketones, ur: NEGATIVE mg/dL
Leukocytes, UA: NEGATIVE
Nitrite: NEGATIVE
PROTEIN: NEGATIVE mg/dL
Specific Gravity, Urine: 1.015 (ref 1.005–1.030)
pH: 6.5 (ref 5.0–8.0)

## 2016-11-09 MED ORDER — VANCOMYCIN HCL 125 MG PO CAPS: 125.0000 mg | ORAL_CAPSULE | Freq: Four times a day (QID) | ORAL | 0 refills | Status: DC

## 2016-11-09 MED ORDER — ONDANSETRON 8 MG PO TBDP
8.0000 mg | ORAL_TABLET | Freq: Once | ORAL | Status: AC
  Administered 2016-11-09: 8 mg via ORAL

## 2016-11-09 NOTE — Telephone Encounter (Signed)
Pt called stating since having her stent removed last week she continues to have vomiting and diarrhea. Pt wanted to know if a ucx was performed. Made pt aware an u/a was done at [facility] but not a ucx. Pt voiced understanding stating she would call her PCP.

## 2016-11-09 NOTE — ED Provider Notes (Signed)
[facility]    CSN: [id number] Arrival date & time: 11/09/16  1412     History   Chief Complaint Chief Complaint  Patient presents with  . Nausea    HPI Brandi Bell is a 56 y.o. female.   56 yo female with a c/o diarrhea for the last 4-5 days. States diarrhea is watery, yellow and foul smelling. States she has been having more than 3 diarrhea episodes in a day. Also states she has been taking a lot of antibiotics over the last 2-3 months due to kidney problems. Denies any vomiting, fevers, chills, chest pains, shortness of breath.    The history is provided by the patient.    Past Medical History:  Diagnosis Date  . Anemia   . Anxiety   . Arthritis    osteo Back, Feet, Hands  . Asthma    [facility] admission - on Bipap - 7/13 - report on paper chart/ flare 3 mos ago  . Bipolar disorder ([place])   . Bipolar disorder ([place])   . Bulging lumbar disc   . Cancer (HCC)    cervical  . Cataract   . Depression   . Elevated LFTs    having ultrasound [facility] - 12/14/14  . GERD (gastroesophageal reflux disease)   . Headache    migraines, every three or 4 days/ stress related  . Heart attack ([place])   . 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 History of kidney stones   . History of migraine headaches   . Hypertension    Echo -1/16 - report on paper chart/controlled on meds  . IBS (irritable bowel syndrome)   . Microhematuria   . Motion sickness   . Osteoporosis   . Pneumonia    hx of  . PONV (postoperative nausea and vomiting)   . PTSD (post-traumatic stress disorder)   . Restless leg syndrome   . Rhinitis   . Shortness of breath dyspnea    with exertion  . Spinal stenosis of cervical region   . Suicide attempt ([place])   . Vertigo 6 yrs ago   hx of    Patient Active Problem List   Diagnosis Date Noted  . Vitamin D deficiency 07/21/2016  . Suicide attempt ([place]) 07/19/2016  . PTSD (post-traumatic stress disorder) 07/19/2016  .  Migraine headache 07/19/2016  . RLS (restless legs syndrome) 07/01/2016  . Osteoporosis 07/01/2016  . Trochanteric bursitis 03/17/2016  . Prediabetes 03/17/2016  . Gastritis   . Pituitary adenoma ([place]) 10/02/2015  . Borderline personality disorder 02/10/2015  . HTN (hypertension) 02/10/2015  . Chronic pain syndrome 02/10/2015  . Arthritis 02/10/2015  . Hx of colonic polyps   . Seasonal allergies 12/21/2013  . Fibromyalgia 12/21/2013  . Moderate persistent asthma without complication 54/62/7035  . Chronic nausea 06/20/2013  . Bipolar disorder ([place]) 06/20/2013  . Malignant neoplasm of cervix ([place]) 07/20/2012  . Lumbar herniated disc 07/20/2012  . Hyperlipidemia 07/20/2012  . 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 Breast mass 07/20/2012    Past Surgical History:  Procedure Laterality Date  . ANTERIOR AND POSTERIOR REPAIR     done with bladder tact  . BILATERAL SALPINGOOPHORECTOMY  2004   pain from scar tissue  . bladder tact    . CARDIAC CATHETERIZATION  2008   [facility] - neg - report in paper chart  . CATARACT EXTRACTION Bilateral 2007  . CHOLECYSTECTOMY    . COLONOSCOPY  N/A 01/04/2015   Procedure: COLONOSCOPY;  Surgeon: Lucilla Lame, MD;  Location: [place];  Service: Gastroenterology;  Laterality: N/A;  with biopsies  . CYSTOSCOPY W/ RETROGRADES Right 10/26/2016   Procedure: CYSTOSCOPY WITH RETROGRADE PYELOGRAM;  Surgeon: Hollice Espy, MD;  Location: [facility];  Service: Urology;  Laterality: Right;  . CYSTOSCOPY WITH STENT PLACEMENT Left 10/26/2016   Procedure: CYSTOSCOPY WITH STENT PLACEMENT;  Surgeon: Hollice Espy, MD;  Location: [facility];  Service: Urology;  Laterality: Left;  . ESOPHAGOGASTRODUODENOSCOPY (EGD) WITH PROPOFOL N/A 12/30/2015   Procedure: ESOPHAGOGASTRODUODENOSCOPY (EGD) WITH PROPOFOL;  Surgeon: Lucilla Lame, MD;  Location: [place];  Service: Endoscopy;  Laterality: N/A;  . FOOT SURGERY     multiple  . SEPTOPLASTY    .  SHOULDER ARTHROSCOPY Bilateral 2008  . TONSILLECTOMY  6  . TUBAL LIGATION    . URETEROSCOPY WITH HOLMIUM LASER LITHOTRIPSY Left 10/26/2016   Procedure: URETEROSCOPY WITH HOLMIUM LASER LITHOTRIPSY;  Surgeon: Hollice Espy, MD;  Location: [facility];  Service: Urology;  Laterality: Left;  Marland Kitchen VAGINAL HYSTERECTOMY  1989   cervical cancer age 25    OB History    No data available       Home Medications    Prior to Admission medications   Medication Sig Start Date End Date Taking? Authorizing Provider  albuterol (PROVENTIL HFA;VENTOLIN HFA) 108 (90 Base) MCG/ACT inhaler Inhale 1-2 puffs into the lungs every 6 (six) hours as needed for wheezing or shortness of breath. 10/06/16  Yes Glean Hess, MD  ARIPiprazole (ABILIFY) 15 MG tablet Take 15 mg by mouth every morning.   Yes [provider]  Calcium Citrate-Vitamin D (CALCIUM CITRATE + PO) Take 600 mg elemental calcium/kg/hr by mouth 2 (two) times daily.   Yes [provider]  Cholecalciferol (VITAMIN D3) 2000 units TABS Take 2,000 Units by mouth daily.    Yes [provider]  clonazePAM (KLONOPIN) 0.5 MG tablet Take 0.5 mg by mouth 2 (two) times daily. Am and pm   Yes [provider]  esomeprazole (NEXIUM) 40 MG capsule Take 1 capsule (40 mg total) by mouth daily before breakfast. 06/01/16  Yes Lucilla Lame, MD  Multiple Vitamins-Minerals (MULTIVITAMIN ADULT PO) Take 1 tablet by mouth daily.    Yes [provider]  ondansetron (ZOFRAN-ODT) 8 MG disintegrating tablet Take 8 mg by mouth every 8 (eight) hours as needed for nausea or vomiting.   Yes [provider]  oxybutynin (DITROPAN) 5 MG tablet Take 1 tablet (5 mg total) by mouth every 8 (eight) hours as needed for bladder spasms. 10/26/16  Yes Hollice Espy, MD  oxyCODONE-acetaminophen (PERCOCET/ROXICET) 5-325 MG tablet Take 1 tablet by mouth every 4 (four) hours as needed for moderate pain or severe pain.   Yes [provider]    rOPINIRole (REQUIP) 3 MG tablet Take 3 mg by mouth at bedtime.   Yes [provider]  terazosin (HYTRIN) 5 MG capsule Take 1 capsule (5 mg total) by mouth at bedtime. 10/26/16  Yes Hollice Espy, MD  traMADol (ULTRAM) 50 MG tablet Take 100 mg by mouth at bedtime.    Yes [provider]  valACYclovir (VALTREX) 500 MG tablet Take 500 mg by mouth at bedtime.    Yes [provider]  vortioxetine HBr (TRINTELLIX) 10 MG TABS Take 10 mg by mouth every morning.    Yes [provider]  modafinil (PROVIGIL) 200 MG tablet Take 200 mg by mouth 2 (two) times daily. Each Morning  and at [facility]    [provider]  nitrofurantoin, macrocrystal-monohydrate, (MACROBID) 100 MG capsule Take 100 mg by mouth 2 (two) times daily.    [provider]  oxyCODONE-acetaminophen (ROXICET) 5-325 MG tablet Take 1-2 tablets by mouth every 4 (four) hours as needed for severe pain. Patient not taking: Reported on 11/02/2016 10/29/16   Zara Council A, PA-C  vancomycin (VANCOCIN) 125 MG capsule Take 1 capsule (125 mg total) by mouth 4 (four) times daily. 11/09/16   Norval Gable, MD    Family History Family History  Problem Relation Age of Onset  . Asthma Mother   . Diabetes Mother   . Breast cancer Mother   . Melanoma Mother   . Heart attack Father 31  . Breast cancer Sister   . Brain cancer Brother   . Kidney cancer Neg Hx   . Bladder Cancer Neg Hx   . Prostate cancer Neg Hx     Social History Social History  Substance Use Topics  . Smoking status: Never Smoker  . Smokeless tobacco: Never Used  . Alcohol use No     Allergies   Ciprofloxacin; Morphine; Tapentadol; Amoxicillin; Morphine and related; Oxybutynin; Pineapple; Sulfa antibiotics; Tape; and Other   Review of Systems Review of Systems   Physical Exam Triage Vital Signs ED Triage Vitals  Enc Vitals Group     BP 11/09/16 1515 130/87     Pulse Rate 11/09/16 1515 82     Resp 11/09/16 1515 18      Temp 11/09/16 1515 98.4 F (36.9 C)     Temp Source 11/09/16 1515 Oral     SpO2 11/09/16 1515 99 %     Weight 11/09/16 1510 210 lb (95.3 kg)     Height 11/09/16 1510 5\' 1"  (1.549 m)     Head Circumference --      Peak Flow --      Pain Score 11/09/16 1510 0     Pain Loc --      Pain Edu? --      Excl. in [place]? --    No data found.   Updated Vital Signs BP 130/87 (BP Location: Left Arm)   Pulse 82   Temp 98.4 F (36.9 C) (Oral)   Resp 18   Ht 5\' 1"  (1.549 m)   Wt 210 lb (95.3 kg)   SpO2 99%   BMI 39.68 kg/m   Visual Acuity Right Eye Distance:   Left Eye Distance:   Bilateral Distance:    Right Eye Near:   Left Eye Near:    Bilateral Near:     Physical Exam  Constitutional: She appears well-developed and well-nourished. No distress.  Abdominal: Soft. Bowel sounds are normal. She exhibits no distension and no mass. There is tenderness (mild, diffuse; no rebound or guarding). There is no rebound and no guarding.  Skin: She is not diaphoretic.  Nursing note and vitals reviewed.    UC Treatments / Results  Labs (all labs ordered are listed, but only abnormal results are displayed) Labs Reviewed  COMPREHENSIVE METABOLIC PANEL - Abnormal; Notable for the following:       Result Value   Sodium 127 (*)    Chloride 94 (*)    BUN <5 (*)    All other components within normal limits  URINALYSIS, COMPLETE (UACMP) WITH MICROSCOPIC - Abnormal; Notable for the following:    Hgb urine dipstick TRACE (*)    Squamous Epithelial / LPF 0-5 (*)  Bacteria, UA RARE (*)    All other components within normal limits  GASTROINTESTINAL PANEL BY PCR, STOOL (REPLACES STOOL CULTURE)  CBC WITH DIFFERENTIAL/PLATELET    EKG  EKG Interpretation None       Radiology No results found.  Procedures Procedures (including critical care time)  Medications Ordered in UC Medications  ondansetron (ZOFRAN-ODT) disintegrating tablet 8 mg (8 mg Oral Given 11/09/16 1553)     Initial  Impression / Assessment and Plan / UC Course  I have reviewed the triage vital signs and the nursing notes.  Pertinent labs & imaging results that were available during my care of the patient were reviewed by me and considered in my medical decision making (see chart for details).       Final Clinical Impressions(s) / UC Diagnoses   Final diagnoses:  Diarrhea, unspecified type  Hyponatremia    New Prescriptions Discharge Medication List as of 11/09/2016  5:04 PM    START taking these medications   Details  vancomycin (VANCOCIN) 125 MG capsule Take 1 capsule (125 mg total) by mouth 4 (four) times daily., Starting Mon 11/09/2016, Normal       1. Lab results and diagnosis reviewed with patient 2. Check GI panel 3. Empiric treatment with rx as per orders above ; reviewed possible side effects, interactions, risks and benefits  4. Recommend supportive treatment with clear liquids/increased fluids and sodium intake, then advance diet slowly as tolerated  5. Follow-up prn if symptoms worsen or don't improve   Norval Gable, MD 11/09/16 1945

## 2016-11-09 NOTE — ED Triage Notes (Signed)
Pt is 2 weeks post op from having a kidney stone removed. The surgeon went in and busted up the stone and they placed a stent and she had the stent removed last Monday. She was taking Macrobid til last Wednesday, but she has been vomiting ever since. She is very nauseous and has had diarrhea for the last few days. She feels very sick and feels like a knot is in her stomach. She was under general anesthesia . She called Dr Erlene Quan however she feels this is unrelated to the surgery.

## 2016-11-11 ENCOUNTER — Encounter: Payer: Self-pay | Admitting: Gastroenterology

## 2016-11-12 ENCOUNTER — Encounter: Payer: Self-pay | Admitting: Internal Medicine

## 2016-11-12 ENCOUNTER — Ambulatory Visit (INDEPENDENT_AMBULATORY_CARE_PROVIDER_SITE_OTHER): Payer: Medicare Other | Admitting: Internal Medicine

## 2016-11-12 VITALS — BP 128/80 | HR 81 | Ht 61.0 in | Wt 209.0 lb

## 2016-11-12 DIAGNOSIS — E871 Hypo-osmolality and hyponatremia: Secondary | ICD-10-CM | POA: Diagnosis not present

## 2016-11-12 DIAGNOSIS — R197 Diarrhea, unspecified: Secondary | ICD-10-CM | POA: Diagnosis not present

## 2016-11-12 NOTE — Patient Instructions (Signed)

## 2016-11-12 NOTE — Progress Notes (Signed)
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 Been on antibiotics so she says stool sample was not taken at [facility].  Still having diarrhea but feeling much better because taking Vancomycin. Was having cramping/ bloating, and vomitting and medication has subsited that. Pt says she has not ate since Saturday. Had fluids at the [facility] Monday and taking in fluids but no food. Wants to address low sodium as well. )  Diarrhea   This is a new problem. The current episode started 1 to 4 weeks ago. The problem occurs 2 to 4 times per day. The problem has been gradually improving. The stool consistency is described as mucous and watery. Pertinent negatives include no abdominal pain, chills, fever or vomiting.  No C diff was done but she is responding to the vancomycin oral.  She has completed only 3 days and diarrhea is half. Still very urgent and watery.  She has also had no more vomiting for the past 2 days.  Nausea is well controlled with zofran.   Review of Systems  Constitutional: Positive for fatigue. Negative for chills and fever.  Respiratory: Negative for chest tightness and shortness of breath.   Cardiovascular: Negative for chest pain.  Gastrointestinal: Positive for diarrhea and nausea. Negative for abdominal pain, blood in stool and vomiting.  Neurological: Negative for dizziness and light-headedness.  Hematological: Negative for adenopathy.    Patient Active Problem List   Diagnosis Date Noted  . Vitamin D deficiency 07/21/2016  . Suicide attempt ([place]) 07/19/2016  . PTSD (post-traumatic stress disorder) 07/19/2016  . Migraine headache 07/19/2016  . RLS (restless legs syndrome) 07/01/2016  . Osteoporosis 07/01/2016  . Trochanteric bursitis 03/17/2016  . Prediabetes 03/17/2016  . Gastritis   . Pituitary adenoma ([place]) 10/02/2015  . Borderline personality disorder 02/10/2015  . HTN  (hypertension) 02/10/2015  . Chronic pain syndrome 02/10/2015  . Arthritis 02/10/2015  . Hx of colonic polyps   . Seasonal allergies 12/21/2013  . Fibromyalgia 12/21/2013  . Moderate persistent asthma without complication 93/23/5573  . Chronic nausea 06/20/2013  . Bipolar disorder ([place]) 06/20/2013  . Malignant neoplasm of cervix ([place]) 07/20/2012  . Lumbar herniated disc 07/20/2012  . Hyperlipidemia 07/20/2012  . 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 Breast mass 07/20/2012    Prior to Admission medications   Medication Sig Start Date End Date Taking? Authorizing Provider  albuterol (PROVENTIL HFA;VENTOLIN HFA) 108 (90 Base) MCG/ACT inhaler Inhale 1-2 puffs into the lungs every 6 (six) hours as needed for wheezing or shortness of breath. 10/06/16  Yes Glean Hess, MD  ARIPiprazole (ABILIFY) 15 MG tablet Take 15 mg by mouth every morning.   Yes [provider]  Calcium Citrate-Vitamin D (CALCIUM CITRATE + PO) Take 600 mg elemental calcium/kg/hr by mouth 2 (two) times daily.   Yes [provider]  Cholecalciferol (VITAMIN D3) 2000 units TABS Take 2,000 Units by mouth daily.    Yes [provider]  clonazePAM (KLONOPIN) 0.5 MG tablet Take 0.5 mg by mouth 2 (two) times daily. Am and pm   Yes [provider]  esomeprazole (NEXIUM) 40 MG capsule Take 1 capsule (40 mg total) by mouth daily before breakfast. 06/01/16  Yes Lucilla Lame, MD  Multiple Vitamins-Minerals (MULTIVITAMIN ADULT PO) Take 1 tablet by mouth daily.    Yes [provider]  ondansetron (ZOFRAN-ODT) 8  MG disintegrating tablet Take 8 mg by mouth every 8 (eight) hours as needed for nausea or vomiting.   Yes [provider]  rOPINIRole (REQUIP) 3 MG tablet Take 3 mg by mouth at bedtime.   Yes [provider]  traMADol (ULTRAM) 50 MG tablet Take 100 mg by mouth at bedtime.    Yes [provider]  valACYclovir (VALTREX) 500 MG tablet Take 500  mg by mouth at bedtime.    Yes [provider]  vancomycin (VANCOCIN) 125 MG capsule Take 1 capsule (125 mg total) by mouth 4 (four) times daily. 11/09/16  Yes Norval Gable, MD  vortioxetine HBr (TRINTELLIX) 10 MG TABS Take 10 mg by mouth every morning.    Yes [provider]    Allergies  Allergen Reactions  . Ciprofloxacin Anaphylaxis and Other (See Comments)    Other Reaction: Mouth Swelling Yrs ago  . Morphine Nausea And Vomiting    And vomiting  . Tapentadol Itching  . Amoxicillin Hives    ALL CILLINS  . Morphine And Related Nausea And Vomiting  . Oxybutynin Other (See Comments)    Blurred vision   . Pineapple Other (See Comments)    Reaction:  Mouth blisters  . Sulfa Antibiotics Hives  . Tape Other (See Comments)    Reaction:  Clear tape tears skin.  Tegaderm and paper tape are ok.    . Other Other (See Comments) and Rash    Reaction:Clear tape tears skin.  Tegaderm and paper tape are ok. Reaction:Clear tape tears skin.  Tegaderm and paper tape are ok.    Past Surgical History:  Procedure Laterality Date  . ANTERIOR AND POSTERIOR REPAIR     done with bladder tact  . BILATERAL SALPINGOOPHORECTOMY  2004   pain from scar tissue  . bladder tact    . CARDIAC CATHETERIZATION  2008   [facility] - neg - report in paper chart  . CATARACT EXTRACTION Bilateral 2007  . CHOLECYSTECTOMY    . COLONOSCOPY N/A 01/04/2015   Procedure: COLONOSCOPY;  Surgeon: Lucilla Lame, MD;  Location: [place];  Service: Gastroenterology;  Laterality: N/A;  with biopsies  . CYSTOSCOPY W/ RETROGRADES Right 10/26/2016   Procedure: CYSTOSCOPY WITH RETROGRADE PYELOGRAM;  Surgeon: Hollice Espy, MD;  Location: [facility];  Service: Urology;  Laterality: Right;  . CYSTOSCOPY WITH STENT PLACEMENT Left 10/26/2016   Procedure: CYSTOSCOPY WITH STENT PLACEMENT;  Surgeon: Hollice Espy, MD;  Location: [facility];  Service: Urology;  Laterality: Left;  . ESOPHAGOGASTRODUODENOSCOPY  (EGD) WITH PROPOFOL N/A 12/30/2015   Procedure: ESOPHAGOGASTRODUODENOSCOPY (EGD) WITH PROPOFOL;  Surgeon: Lucilla Lame, MD;  Location: [place];  Service: Endoscopy;  Laterality: N/A;  . FOOT SURGERY     multiple  . SEPTOPLASTY    . SHOULDER ARTHROSCOPY Bilateral 2008  . TONSILLECTOMY  6  . TUBAL LIGATION    . URETEROSCOPY WITH HOLMIUM LASER LITHOTRIPSY Left 10/26/2016   Procedure: URETEROSCOPY WITH HOLMIUM LASER LITHOTRIPSY;  Surgeon: Hollice Espy, MD;  Location: [facility];  Service: Urology;  Laterality: Left;  Marland Kitchen VAGINAL HYSTERECTOMY  1989   cervical cancer age 62    Social History  Substance Use Topics  . Smoking status: Never Smoker  . Smokeless tobacco: Never Used  . Alcohol use No     Medication list has been reviewed and updated.   Physical Exam  Constitutional: She is oriented to person, place, and time. She appears well-developed. No distress.  HENT:  Head: Normocephalic and atraumatic.  Neck:  Normal range of motion. Neck supple.  Cardiovascular: Normal rate, regular rhythm and normal heart sounds.   Pulmonary/Chest: Effort normal and breath sounds normal. No respiratory distress. She has no wheezes.  Abdominal: Soft. Bowel sounds are increased. There is no tenderness.  Musculoskeletal: Normal range of motion.  Neurological: She is alert and oriented to person, place, and time.  Skin: Skin is warm and dry. No rash noted.  Psychiatric: She has a normal mood and affect. Her behavior is normal. Thought content normal.  Nursing note and vitals reviewed.   BP 128/80 (BP Location: Right Arm, Patient Position: Sitting, Cuff Size: Large)   Pulse 81   Ht 5\' 1"  (1.549 m)   Wt 209 lb (94.8 kg)   SpO2 98%   BMI 39.49 kg/m   Assessment and Plan: 1. Diarrhea of presumed infectious origin Presumed C diff from recent antibiotics Finish Vancocin Advance diet slowly  2. Hyponatremia Recheck Continue gatorade for hydration - Basic metabolic panel   No orders  of the defined types were placed in this encounter.   Halina Maidens, MD [facility]  11/12/2016

## 2016-11-13 LAB — BASIC METABOLIC PANEL
BUN/Creatinine Ratio: 7 — ABNORMAL LOW (ref 9–23)
BUN: 4 mg/dL — AB (ref 6–24)
CALCIUM: 9.5 mg/dL (ref 8.7–10.2)
CHLORIDE: 99 mmol/L (ref 96–106)
CO2: 26 mmol/L (ref 18–29)
Creatinine, Ser: 0.58 mg/dL (ref 0.57–1.00)
GFR calc Af Amer: 120 mL/min/{1.73_m2} (ref >59)
GFR calc non Af Amer: 104 mL/min/{1.73_m2} (ref >59)
GLUCOSE: 87 mg/dL (ref 65–99)
Potassium: 4.3 mmol/L (ref 3.5–5.2)
Sodium: 143 mmol/L (ref 134–144)

## 2016-11-16 ENCOUNTER — Encounter: Payer: Self-pay | Admitting: Internal Medicine

## 2016-11-20 ENCOUNTER — Telehealth: Payer: Self-pay | Admitting: Gastroenterology

## 2016-11-20 NOTE — Telephone Encounter (Signed)
Patient called and wants to know if she needs a stool samples before she comes back in. Her symptoms have returned w/ diarreah and nausea. Please call patient.

## 2016-11-25 ENCOUNTER — Encounter: Payer: Self-pay | Admitting: Internal Medicine

## 2016-12-01 ENCOUNTER — Ambulatory Visit: Payer: Medicare Other | Admitting: Urology

## 2016-12-01 ENCOUNTER — Ambulatory Visit: Payer: Medicare Other

## 2016-12-01 ENCOUNTER — Ambulatory Visit
Admission: RE | Admit: 2016-12-01 | Discharge: 2016-12-01 | Disposition: A | Discharge status: Home / Self Care | Payer: Medicare Other | Source: Ambulatory Visit | Attending: Urology | Admitting: Urology

## 2016-12-01 ENCOUNTER — Ambulatory Visit: Payer: Medicare Other | Admitting: Gastroenterology

## 2016-12-01 DIAGNOSIS — Z87442 Personal history of urinary calculi: Secondary | ICD-10-CM | POA: Diagnosis not present

## 2016-12-01 DIAGNOSIS — R109 Unspecified abdominal pain: Secondary | ICD-10-CM | POA: Insufficient documentation

## 2016-12-02 NOTE — Progress Notes (Signed)
12/04/2016 9:19 AM   Tonia Ghent 08/03/1960 [id number]  Referring provider: Glean Hess, MD [address] [place], [place] [address]  Chief Complaint  Patient presents with  . Results    RUS  . Follow-up    4 week from ureteroscopy    HPI: 56 yo WF who presents today for a four week follow up for a retained left distal ureteral stone status post ureteroscopy on 10/26/2016 and subsequent cystoscopy, stent removal.  Background history Patient is a 37 -year-old Caucasian female who presents today as a referral from their PCP, Dr. Carolin Coy, for gross hematuria.   Patient had gross hematuria with clots over a week ago that lasted for several days.  She did not have any pain associated with the hematuria.   She does have a prior history of nephrolithiasis which she has passed spontaneously.  Her stone composition is unknown at this time.  She has not passed a stone in over 5 years.  She did have A/P repair with bladder sling about 5 years ago.  She does not have a history of trauma to the genitourinary tract or malignancies of the genitourinary tract.   Her daughter has a history of stones.  Her paternal aunt of bladder cancer.  She is not a smoker.  She not exposed to second hand smoke.  She did not work with Sports administrator, trichloroethylene, etc.   She has HTN.  She has a high BMI.    Non contrast CT On 12/20/2012 performed for severe left-sided pain moving to the suprapubic area and noted bilateral nephrolithiasis. No hydronephrosis or ureterolithiasis evident. No acute abdominal or pelvic abnormality. It appears the patient is status  post hysterectomy. No adnexal mass is evident.   Films are not available for review.  CTU performed on 09/22/2016 noted a distal left pelvic ureteral 4 mm stone (located approximately 2 cm above the left UVJ). Left ureter is normal caliber. No left hydronephrosis. Additional nonobstructing stones in both kidneys.  No suspicious  renal cortical masses. No urothelial lesions, with limitations as described.  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  She then underwent URS for the stone.  She did not tolerate the ureteral stent as she had a lot of N/V.    Stone composition was 59% CaOx monohydrate, 35% CaOx dihydrate and 6% CaPhos.    RUS performed on 12/01/2016 was normal.    Her baseline symptoms consist of frequent urination x 8-9,  urgency, nocturia x 1 and intermittency.    Today, she is not experiencing flank pain, gross hematuria or suprapubic pain.   Her UA today is unremarkable.  She has not had recent fevers, chills, nausea or vomiting.    PMH: Past Medical History:  Diagnosis Date  . Anemia   . Anxiety   . Arthritis    osteo Back, Feet, Hands  . Asthma    [facility] admission - on Bipap - 7/13 - report on paper chart/ flare 3 mos ago  . Bipolar disorder ([place])   . Bipolar disorder ([place])   . Bulging lumbar disc   . Cancer ([facility])    cervical  . Cataract   . Depression   . Elevated LFTs    having ultrasound [facility] - 12/14/14  . GERD (gastroesophageal reflux disease)   . Headache    migraines, every three or 4 days/ stress related  . Heart attack ([place])   . 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 History of kidney stones   . History of migraine headaches   . Hypertension    Echo -1/16 - report on paper chart/controlled on meds  . IBS (irritable bowel syndrome)   . Microhematuria   . Motion sickness   . Osteoporosis   . Pneumonia    hx of  . PONV (postoperative nausea and vomiting)   . PTSD (post-traumatic stress disorder)   . Restless leg syndrome   . Rhinitis   . Shortness of breath dyspnea    with exertion  . Spinal stenosis of cervical region   . Suicide attempt ([place])   . Vertigo 6 yrs ago   hx of    Surgical History: Past Surgical History:  Procedure Laterality Date  . ANTERIOR AND POSTERIOR REPAIR     done with bladder tact    . BILATERAL SALPINGOOPHORECTOMY  2004   pain from scar tissue  . bladder tact    . CARDIAC CATHETERIZATION  2008   [facility] - neg - report in paper chart  . CATARACT EXTRACTION Bilateral 2007  . CHOLECYSTECTOMY    . COLONOSCOPY N/A 01/04/2015   Procedure: COLONOSCOPY;  Surgeon: Lucilla Lame, MD;  Location: [facility];  Service: Gastroenterology;  Laterality: N/A;  with biopsies  . CYSTOSCOPY W/ RETROGRADES Right 10/26/2016   Procedure: CYSTOSCOPY WITH RETROGRADE PYELOGRAM;  Surgeon: Hollice Espy, MD;  Location: [facility];  Service: Urology;  Laterality: Right;  . CYSTOSCOPY WITH STENT PLACEMENT Left 10/26/2016   Procedure: CYSTOSCOPY WITH STENT PLACEMENT;  Surgeon: Hollice Espy, MD;  Location: [facility];  Service: Urology;  Laterality: Left;  . ESOPHAGOGASTRODUODENOSCOPY (EGD) WITH PROPOFOL N/A 12/30/2015   Procedure: ESOPHAGOGASTRODUODENOSCOPY (EGD) WITH PROPOFOL;  Surgeon: Lucilla Lame, MD;  Location: [facility];  Service: Endoscopy;  Laterality: N/A;  . FOOT SURGERY     multiple  . SEPTOPLASTY    . SHOULDER ARTHROSCOPY Bilateral 2008  . TONSILLECTOMY  6  . TUBAL LIGATION    . URETEROSCOPY WITH HOLMIUM LASER LITHOTRIPSY Left 10/26/2016   Procedure: URETEROSCOPY WITH HOLMIUM LASER LITHOTRIPSY;  Surgeon: Hollice Espy, MD;  Location: [facility];  Service: Urology;  Laterality: Left;  Marland Kitchen VAGINAL HYSTERECTOMY  1989   cervical cancer age 30    Home Medications:  Allergies as of 12/04/2016      Reactions   Ciprofloxacin Anaphylaxis, Other (See Comments)   Other Reaction: Mouth Swelling Yrs ago   Morphine Nausea And Vomiting   And vomiting   Tapentadol Itching   Compazine [prochlorperazine Edisylate] Rash   Haldol [haloperidol] Rash   Amoxicillin Hives   ALL CILLINS   Morphine And Related Nausea And Vomiting   Oxybutynin Other (See Comments)   Blurred vision   Pineapple Other (See Comments)   Reaction:  Mouth blisters   Sulfa Antibiotics Hives   Tape Other (See  Comments)   Reaction:  Clear tape tears skin.  Tegaderm and paper tape are ok.     Other Other (See Comments), Rash   Reaction:Clear tape tears skin.  Tegaderm and paper tape are ok. Reaction:Clear tape tears skin.  Tegaderm and paper tape are ok.      Medication List       Accurate as of 12/04/16  9:19 AM. Always use your most recent med list.          albuterol 108 (90 Base) MCG/ACT inhaler Commonly known as:  PROVENTIL HFA;VENTOLIN HFA Inhale 1-2 puffs into the lungs every 6 (six)  hours as needed for wheezing or shortness of breath.   ARIPiprazole 15 MG tablet Commonly known as:  ABILIFY Take 15 mg by mouth every morning.   CALCIUM CITRATE + PO Take 600 mg elemental calcium/kg/hr by mouth 2 (two) times daily.   CALCIUM CITRATE PO Take 600 mg by mouth.   clonazePAM 0.5 MG tablet Commonly known as:  KLONOPIN Take 0.5 mg by mouth 2 (two) times daily. Am and pm   COUGH SYRUP 100 MG/5ML syrup Generic drug:  guaifenesin Take 200 mg by mouth.   esomeprazole 40 MG capsule Commonly known as:  NEXIUM Take 1 capsule (40 mg total) by mouth daily before breakfast.   modafinil 200 MG tablet Commonly known as:  PROVIGIL Take 200 mg by mouth daily.   MULTIVITAMIN ADULT PO Take 1 tablet by mouth daily.   ondansetron 8 MG disintegrating tablet Commonly known as:  ZOFRAN-ODT Take 8 mg by mouth every 8 (eight) hours as needed for nausea or vomiting.   predniSONE 20 MG tablet Commonly known as:  DELTASONE Take 40 mg by mouth.   rizatriptan 10 MG tablet Commonly known as:  MAXALT Take 10 mg by mouth as needed for migraine. May repeat in 2 hours if needed   rOPINIRole 3 MG tablet Commonly known as:  REQUIP Take 3 mg by mouth at bedtime.   traMADol 50 MG tablet Commonly known as:  ULTRAM Take 100 mg by mouth at bedtime.   TRINTELLIX 10 MG Tabs Generic drug:  vortioxetine HBr Take 10 mg by mouth every morning.   valACYclovir 500 MG tablet Commonly known as:   VALTREX Take 500 mg by mouth at bedtime.   vancomycin 125 MG capsule Commonly known as:  VANCOCIN Take 1 capsule (125 mg total) by mouth 4 (four) times daily.   Vitamin D3 2000 units Tabs Take 2,000 Units by mouth daily.       Allergies:  Allergies  Allergen Reactions  . Ciprofloxacin Anaphylaxis and Other (See Comments)    Other Reaction: Mouth Swelling Yrs ago  . Morphine Nausea And Vomiting    And vomiting  . Tapentadol Itching  . Compazine [Prochlorperazine Edisylate] Rash  . Haldol [Haloperidol] Rash  . Amoxicillin Hives    ALL CILLINS  . Morphine And Related Nausea And Vomiting  . Oxybutynin Other (See Comments)    Blurred vision   . Pineapple Other (See Comments)    Reaction:  Mouth blisters  . Sulfa Antibiotics Hives  . Tape Other (See Comments)    Reaction:  Clear tape tears skin.  Tegaderm and paper tape are ok.    . Other Other (See Comments) and Rash    Reaction:Clear tape tears skin.  Tegaderm and paper tape are ok. Reaction:Clear tape tears skin.  Tegaderm and paper tape are ok.    Family History: Family History  Problem Relation Age of Onset  . Asthma Mother   . Diabetes Mother   . Breast cancer Mother   . Melanoma Mother   . Heart attack Father 65  . Breast cancer Sister   . Brain cancer Brother   . Kidney cancer Neg Hx   . Bladder Cancer Neg Hx   . Prostate cancer Neg Hx     Social History:  reports that she has never smoked. She has never used smokeless tobacco. She reports that she does not drink alcohol or use drugs.  ROS: UROLOGY Frequent Urination?: No Hard to postpone urination?: No Burning/pain with urination?: No Get up  at night to urinate?: No Leakage of urine?: No Urine stream starts and stops?: No Trouble starting stream?: No Do you have to strain to urinate?: No Blood in urine?: No Urinary tract infection?: No Sexually transmitted disease?: No Injury to kidneys or bladder?: No Painful intercourse?:  No Weak stream?: No Currently pregnant?: No Vaginal bleeding?: No Last menstrual period?: n  Gastrointestinal Nausea?: Yes Vomiting?: No Indigestion/heartburn?: Yes Diarrhea?: No Constipation?: No  Constitutional Fever: No Night sweats?: No Weight loss?: No Fatigue?: No  Skin Skin rash/lesions?: No Itching?: No  Eyes Blurred vision?: No Double vision?: No  Ears/Nose/Throat Sore throat?: No Sinus problems?: No  Hematologic/Lymphatic Swollen glands?: No Easy bruising?: No  Cardiovascular Leg swelling?: No Chest pain?: No  Respiratory Cough?: Yes Shortness of breath?: Yes  Endocrine Excessive thirst?: No  Musculoskeletal Back pain?: No Joint pain?: Yes  Neurological Headaches?: No Dizziness?: No  Psychologic Depression?: No Anxiety?: Yes  Physical Exam: BP (!) 139/92   Pulse 96   Ht 5\' 1"  (1.549 m)   Wt 212 lb (96.2 kg)   BMI 40.06 kg/m   Constitutional: Well nourished. Alert and oriented, No acute distress. HEENT: [place] AT, moist mucus membranes. Trachea midline, no masses. Cardiovascular: No clubbing, cyanosis, or edema. Respiratory: Normal respiratory effort, no increased work of breathing. GI: Abdomen is soft, non tender, non distended, no abdominal masses. Liver and spleen not palpable.  No hernias appreciated.  Stool sample for occult testing is not indicated.   GU: No CVA tenderness.  No bladder fullness or masses.   Skin: No rashes, bruises or suspicious lesions. Lymph: No cervical or inguinal adenopathy. Neurologic: Grossly intact, no focal deficits, moving all 4 extremities. Psychiatric: Normal mood and affect.  Laboratory Data: Lab Results  Component Value Date   WBC 6.4 11/09/2016   HGB 15.0 11/09/2016   HCT 43.7 11/09/2016   MCV 89.1 11/09/2016   PLT 265 11/09/2016    Lab Results  Component Value Date   CREATININE 0.58 11/12/2016     Lab Results  Component Value Date   HGBA1C 5.1 02/10/2015    Lab Results   Component Value Date   TSH 1.476 02/08/2015       Component Value Date/Time   CHOL 239 (H) 02/10/2015 1329   HDL 62 02/10/2015 1329   CHOLHDL 3.9 02/10/2015 1329   VLDL 19 02/10/2015 1329   LDLCALC 158 (H) 02/10/2015 1329    Lab Results  Component Value Date   AST 29 11/09/2016   Lab Results  Component Value Date   ALT 23 11/09/2016    Urinalysis 0-5 RBC's.  See EPIC.    Pertinent Imaging:   CLINICAL DATA:  Recent left ureteral stone. Ureteroscopy in April 2018.  EXAM: RENAL / URINARY TRACT ULTRASOUND COMPLETE  COMPARISON:  CT scan of the abdomen and pelvis dated 10/04/2016  FINDINGS: Right Kidney:  Length: 11.1 cm. Echogenicity within normal limits. No mass or hydronephrosis visualized.  Left Kidney:  Length: 11.0 cm. Echogenicity within normal limits. No mass or hydronephrosis visualized.  Bladder:  Appears normal for degree of bladder distention.  IMPRESSION: Normal exam.  No hydronephrosis or visible renal calculi.   Electronically Signed   By: Lorriane Shire M.D.   On: 12/01/2016 11:40      Assessment & Plan:    1. Gross hematuria  - most likely due to nephrolithiasis  - UA is negative for hematuria  - RTC in one year for UA  - Report any gross hematuria in  the interim  2. Left ureteral stone  - s/p URS  - RUS negative for hydronephrosis  3. History of nephrolithiasis  - see above  - offered 24 hour study - patient deferred  - RTC in one year for KUB and symptom recheck  - Advised to contact our office or seek treatment in the ED if becomes febrile or pain/ vomiting are difficult control in order to arrange for emergent/urgent intervention  Return in about 1 year (around 12/04/2017) for KUB and office visit.  These notes generated with voice recognition software. I apologize for typographical errors.  Zara Council, [facility] [address] [place], [place]  [address] [phone]

## 2016-12-03 ENCOUNTER — Other Ambulatory Visit: Payer: Self-pay | Admitting: *Deleted

## 2016-12-03 DIAGNOSIS — N201 Calculus of ureter: Secondary | ICD-10-CM

## 2016-12-04 ENCOUNTER — Other Ambulatory Visit: Payer: Self-pay

## 2016-12-04 ENCOUNTER — Ambulatory Visit
Admission: RE | Admit: 2016-12-04 | Discharge: 2016-12-04 | Disposition: A | Discharge status: Home / Self Care | Payer: Medicare Other | Source: Ambulatory Visit | Attending: Urology | Admitting: Urology

## 2016-12-04 ENCOUNTER — Other Ambulatory Visit
Admission: RE | Admit: 2016-12-04 | Discharge: 2016-12-04 | Disposition: A | Discharge status: Home / Self Care | Payer: Medicare Other | Source: Ambulatory Visit | Attending: Urology | Admitting: Urology

## 2016-12-04 ENCOUNTER — Ambulatory Visit (INDEPENDENT_AMBULATORY_CARE_PROVIDER_SITE_OTHER): Payer: Medicare Other | Admitting: Urology

## 2016-12-04 ENCOUNTER — Telehealth: Payer: Self-pay | Admitting: Gastroenterology

## 2016-12-04 ENCOUNTER — Encounter: Payer: Self-pay | Admitting: Urology

## 2016-12-04 VITALS — BP 139/92 | HR 96 | Ht 61.0 in | Wt 212.0 lb

## 2016-12-04 DIAGNOSIS — R310 Gross hematuria: Secondary | ICD-10-CM

## 2016-12-04 DIAGNOSIS — R109 Unspecified abdominal pain: Secondary | ICD-10-CM | POA: Diagnosis not present

## 2016-12-04 DIAGNOSIS — N201 Calculus of ureter: Secondary | ICD-10-CM

## 2016-12-04 DIAGNOSIS — Z87442 Personal history of urinary calculi: Secondary | ICD-10-CM

## 2016-12-04 LAB — URINALYSIS, COMPLETE (UACMP) WITH MICROSCOPIC
BACTERIA UA: NONE SEEN
BILIRUBIN URINE: NEGATIVE
GLUCOSE, UA: NEGATIVE mg/dL
HGB URINE DIPSTICK: NEGATIVE
LEUKOCYTES UA: NEGATIVE
Nitrite: NEGATIVE
PH: 7.5 (ref 5.0–8.0)
Specific Gravity, Urine: 1.025 (ref 1.005–1.030)

## 2016-12-04 MED ORDER — ONDANSETRON 8 MG PO TBDP: 8.0000 mg | ORAL_TABLET | Freq: Three times a day (TID) | ORAL | 1 refills | Status: DC | PRN

## 2016-12-04 NOTE — Patient Instructions (Addendum)
Kidney Stones Kidney stones (urolithiasis) are solid, rock-like deposits that form inside of the organs that make urine (kidneys). A kidney stone may form in a kidney and move into the bladder, where it can cause intense pain and block the flow of urine. Kidney stones are created when high levels of certain minerals are found in the urine. They are usually passed through urination, but in some cases, medical treatment may be needed to remove them. What are the causes? Kidney stones may be caused by:  A condition in which certain glands produce too much parathyroid hormone (primary hyperparathyroidism), which causes too much calcium buildup in the blood.  Buildup of uric acid crystals in the bladder (hyperuricosuria). Uric acid is a chemical that the body produces when you eat certain foods. It usually exits the body in the urine.  Narrowing (stricture) of one or both of the tubes that drain urine from the kidneys to the bladder (ureters).  A kidney blockage that is present at birth (congenital obstruction).  Past surgery on the kidney or the ureters, such as gastric bypass surgery.  What increases the risk? The following factors make you more likely to develop kidney stones:  Having had a kidney stone in the past.  Having a family history of kidney stones.  Not drinking enough water.  Eating a diet that is high in protein, salt (sodium), or sugar.  Being overweight or obese.  What are the signs or symptoms? Symptoms of a kidney stone may include:  Nausea.  Vomiting.  Blood in the urine (hematuria).  Pain in the side of the abdomen, right below the ribs (flank pain). Pain usually spreads (radiates) to the groin.  Needing to urinate frequently or urgently.  How is this diagnosed? This condition may be diagnosed based on:  Your medical history.  A physical exam.  Blood tests.  Urine tests.  CT scan.  Abdominal X-ray.  A procedure to examine the inside of the  bladder (cystoscopy).  How is this treated? Treatment for kidney stones depends on the size, location, and makeup of the stones. Treatment may involve:  Analyzing your urine before and after you pass the stone through urination.  Being monitored at the hospital until you pass the stone through urination.  Increasing your fluid intake and decreasing the amount of calcium and protein in your diet.  A procedure to break up kidney stones in the bladder using: ? A focused beam of light (laser therapy). ? Shock waves (extracorporeal shock wave lithotripsy).  Surgery to remove kidney stones. This may be needed if you have severe pain or have stones that block your urinary tract.  Follow these instructions at home: Eating and drinking   Drink enough fluid to keep your urine clear or pale yellow. This will help you to pass the kidney stone.  If directed, change your diet. This may include: ? Limiting how much sodium you eat. ? Eating more fruits and vegetables. ? Limiting how much meat, poultry, fish, and eggs you eat.  Follow instructions from your health care provider about eating or drinking restrictions. General instructions  Collect urine samples as told by your health care provider. You may need to collect a urine sample: ? 24 hours after you pass the stone. ? 8-12 weeks after passing the kidney stone, and every 6-12 months after that.  Strain your urine every time you urinate, for as long as directed. Use the strainer that your health care provider recommends.  Do not throw out   the kidney stone after passing it. Keep the stone so it can be tested by your health care provider. Testing the makeup of your kidney stone may help prevent you from getting kidney stones in the future.  Take over-the-counter and prescription medicines only as told by your health care provider.  Keep all follow-up visits as told by your health care provider. This is important. You may need follow-up  X-rays or ultrasounds to make sure that your stone has passed. How is this prevented? To prevent another kidney stone:  Drink enough fluid to keep your urine clear or pale yellow. This is the best way to prevent kidney stones.  Eat a healthy diet and follow recommendations from your health care provider about foods to avoid. You may be instructed to eat a low-protein diet. Recommendations vary depending on the type of kidney stone that you have.  Maintain a healthy weight.  Contact a health care provider if:  You have pain that gets worse or does not get better with medicine. Get help right away if:  You have a fever or chills.  You develop severe pain.  You develop new abdominal pain.  You faint.  You are unable to urinate. This information is not intended to replace advice given to you by your health care provider. Make sure you discuss any questions you have with your health care provider. Document Released: 06/22/2005 Document Revised: 01/10/2016 Document Reviewed: 12/06/2015 Elsevier Interactive Patient Education  2017 [place].  Kidney Stones Kidney stones (urolithiasis) are solid, rock-like deposits that form inside of the organs that make urine (kidneys). A kidney stone may form in a kidney and move into the bladder, where it can cause intense pain and block the flow of urine. Kidney stones are created when high levels of certain minerals are found in the urine. They are usually passed through urination, but in some cases, medical treatment may be needed to remove them. What are the causes? Kidney stones may be caused by:  A condition in which certain glands produce too much parathyroid hormone (primary hyperparathyroidism), which causes too much calcium buildup in the blood.  Buildup of uric acid crystals in the bladder (hyperuricosuria). Uric acid is a chemical that the body produces when you eat certain foods. It usually exits the body in the urine.  Narrowing  (stricture) of one or both of the tubes that drain urine from the kidneys to the bladder (ureters).  A kidney blockage that is present at birth (congenital obstruction).  Past surgery on the kidney or the ureters, such as gastric bypass surgery.  What increases the risk? The following factors make you more likely to develop kidney stones:  Having had a kidney stone in the past.  Having a family history of kidney stones.  Not drinking enough water.  Eating a diet that is high in protein, salt (sodium), or sugar.  Being overweight or obese.  What are the signs or symptoms? Symptoms of a kidney stone may include:  Nausea.  Vomiting.  Blood in the urine (hematuria).  Pain in the side of the abdomen, right below the ribs (flank pain). Pain usually spreads (radiates) to the groin.  Needing to urinate frequently or urgently.  How is this diagnosed? This condition may be diagnosed based on:  Your medical history.  A physical exam.  Blood tests.  Urine tests.  CT scan.  Abdominal X-ray.  A procedure to examine the inside of the bladder (cystoscopy).  How is this treated? Treatment  for kidney stones depends on the size, location, and makeup of the stones. Treatment may involve:  Analyzing your urine before and after you pass the stone through urination.  Being monitored at the hospital until you pass the stone through urination.  Increasing your fluid intake and decreasing the amount of calcium and protein in your diet.  A procedure to break up kidney stones in the bladder using: ? A focused beam of light (laser therapy). ? Shock waves (extracorporeal shock wave lithotripsy).  Surgery to remove kidney stones. This may be needed if you have severe pain or have stones that block your urinary tract.  Follow these instructions at home: Eating and drinking   Drink enough fluid to keep your urine clear or pale yellow. This will help you to pass the kidney  stone.  If directed, change your diet. This may include: ? Limiting how much sodium you eat. ? Eating more fruits and vegetables. ? Limiting how much meat, poultry, fish, and eggs you eat.  Follow instructions from your health care provider about eating or drinking restrictions. General instructions  Collect urine samples as told by your health care provider. You may need to collect a urine sample: ? 24 hours after you pass the stone. ? 8-12 weeks after passing the kidney stone, and every 6-12 months after that.  Strain your urine every time you urinate, for as long as directed. Use the strainer that your health care provider recommends.  Do not throw out the kidney stone after passing it. Keep the stone so it can be tested by your health care provider. Testing the makeup of your kidney stone may help prevent you from getting kidney stones in the future.  Take over-the-counter and prescription medicines only as told by your health care provider.  Keep all follow-up visits as told by your health care provider. This is important. You may need follow-up X-rays or ultrasounds to make sure that your stone has passed. How is this prevented? To prevent another kidney stone:  Drink enough fluid to keep your urine clear or pale yellow. This is the best way to prevent kidney stones.  Eat a healthy diet and follow recommendations from your health care provider about foods to avoid. You may be instructed to eat a low-protein diet. Recommendations vary depending on the type of kidney stone that you have.  Maintain a healthy weight.  Contact a health care provider if:  You have pain that gets worse or does not get better with medicine. Get help right away if:  You have a fever or chills.  You develop severe pain.  You develop new abdominal pain.  You faint.  You are unable to urinate. This information is not intended to replace advice given to you by your health care provider. Make sure  you discuss any questions you have with your health care provider. Document Released: 06/22/2005 Document Revised: 01/10/2016 Document Reviewed: 12/06/2015 Elsevier Interactive Patient Education  2017 [place].  Dietary Guidelines to Help Prevent Kidney Stones Kidney stones are deposits of minerals and salts that form inside your kidneys. Your risk of developing kidney stones may be greater depending on your diet, your lifestyle, the medicines you take, and whether you have certain medical conditions. Most people can reduce their chances of developing kidney stones by following the instructions below. Depending on your overall health and the type of kidney stones you tend to develop, your dietitian may give you more specific instructions. What are tips for following  this plan? Reading food labels  Choose foods with "no salt added" or "low-salt" labels. Limit your sodium intake to less than 1500 mg per day.  Choose foods with calcium for each meal and snack. Try to eat about 300 mg of calcium at each meal. Foods that contain 200-500 mg of calcium per serving include: ? 8 oz (237 ml) of milk, fortified nondairy milk, and fortified fruit juice. ? 8 oz (237 ml) of kefir, yogurt, and soy yogurt. ? 4 oz (118 ml) of tofu. ? 1 oz of cheese. ? 1 cup (300 g) of dried figs. ? 1 cup (91 g) of cooked broccoli. ? 1-3 oz can of sardines or mackerel.  Most people need 1000 to 1500 mg of calcium each day. Talk to your dietitian about how much calcium is recommended for you. Shopping  Buy plenty of fresh fruits and vegetables. Most people do not need to avoid fruits and vegetables, even if they contain nutrients that may contribute to kidney stones.  When shopping for convenience foods, choose: ? Whole pieces of fruit. ? Premade salads with dressing on the side. ? Low-fat fruit and yogurt smoothies.  Avoid buying frozen meals or prepared deli foods.  Look for foods with live cultures, such as  yogurt and kefir. Cooking  Do not add salt to food when cooking. Place a salt shaker on the table and allow each person to add his or her own salt to taste.  Use vegetable protein, such as beans, textured vegetable protein (TVP), or tofu instead of meat in pasta, casseroles, and soups. Meal planning  Eat less salt, if told by your dietitian. To do this: ? Avoid eating processed or premade food. ? Avoid eating fast food.  Eat less animal protein, including cheese, meat, poultry, or fish, if told by your dietitian. To do this: ? Limit the number of times you have meat, poultry, fish, or cheese each week. Eat a diet free of meat at least 2 days a week. ? Eat only one serving each day of meat, poultry, fish, or seafood. ? When you prepare animal protein, cut pieces into small portion sizes. For most meat and fish, one serving is about the size of one deck of cards.  Eat at least 5 servings of fresh fruits and vegetables each day. To do this: ? Keep fruits and vegetables on hand for snacks. ? Eat 1 piece of fruit or a handful of berries with breakfast. ? Have a salad and fruit at lunch. ? Have two kinds of vegetables at dinner.  Limit foods that are high in a substance called oxalate. These include: ? Spinach. ? Rhubarb. ? Beets. ? Potato chips and french fries. ? Nuts.  If you regularly take a diuretic medicine, make sure to eat at least 1-2 fruits or vegetables high in potassium each day. These include: ? Avocado. ? Banana. ? Orange, prune, carrot, or tomato juice. ? Baked potato. ? Cabbage. ? Beans and split peas. General instructions  Drink enough fluid to keep your urine clear or pale yellow. This is the most important thing you can do.  Talk to your health care provider and dietitian about taking daily supplements. Depending on your health and the cause of your kidney stones, you may be advised: ? Not to take supplements with vitamin C. ? To take a calcium  supplement. ? To take a daily probiotic supplement. ? To take other supplements such as magnesium, fish oil, or vitamin B6.  Take all  medicines and supplements as told by your health care provider.  Limit alcohol intake to no more than 1 drink a day for nonpregnant women and 2 drinks a day for men. One drink equals 12 oz of beer, 5 oz of wine, or 1 oz of hard liquor.  Lose weight if told by your health care provider. Work with your dietitian to find strategies and an eating plan that works best for you. What foods are not recommended? Limit your intake of the following foods, or as told by your dietitian. Talk to your dietitian about specific foods you should avoid based on the type of kidney stones and your overall health. Grains Breads. Bagels. Rolls. Baked goods. Salted crackers. Cereal. Pasta. Vegetables Spinach. Rhubarb. Beets. Canned vegetables. Angie Fava. Olives. Meats and other protein foods Nuts. Nut butters. Large portions of meat, poultry, or fish. Salted or cured meats. Deli meats. Hot dogs. Sausages. Dairy Cheese. Beverages Regular soft drinks. Regular vegetable juice. Seasonings and other foods Seasoning blends with salt. Salad dressings. Canned soups. Soy sauce. Ketchup. Barbecue sauce. Canned pasta sauce. Casseroles. Pizza. Lasagna. Frozen meals. Potato chips. [place] fries. Summary  You can reduce your risk of kidney stones by making changes to your diet.  The most important thing you can do is drink enough fluid. You should drink enough fluid to keep your urine clear or pale yellow.  Ask your health care provider or dietitian how much protein from animal sources you should eat each day, and also how much salt and calcium you should have each day. This information is not intended to replace advice given to you by your health care provider. Make sure you discuss any questions you have with your health care provider. Document Released: 10/17/2010 Document Revised:  06/02/2016 Document Reviewed: 06/02/2016 Elsevier Interactive Patient Education  2017 [employer].

## 2016-12-04 NOTE — Telephone Encounter (Signed)
Patient called and needs a refill on Zofran ODT 8 mg [facility]

## 2016-12-07 NOTE — Telephone Encounter (Signed)
Rx for Zofran sent to [facility] per pt request.

## 2016-12-10 ENCOUNTER — Inpatient Hospital Stay: Payer: Medicare Other | Admitting: Internal Medicine

## 2016-12-10 ENCOUNTER — Encounter: Payer: Self-pay | Admitting: Urology

## 2016-12-18 ENCOUNTER — Other Ambulatory Visit: Payer: Self-pay | Admitting: Internal Medicine

## 2016-12-21 ENCOUNTER — Other Ambulatory Visit
Admission: RE | Admit: 2016-12-21 | Discharge: 2016-12-21 | Disposition: A | Discharge status: Home / Self Care | Payer: Medicare Other | Source: Ambulatory Visit | Attending: Internal Medicine | Admitting: Internal Medicine

## 2016-12-21 ENCOUNTER — Encounter: Payer: Self-pay | Admitting: Internal Medicine

## 2016-12-21 ENCOUNTER — Ambulatory Visit (INDEPENDENT_AMBULATORY_CARE_PROVIDER_SITE_OTHER): Payer: Medicare Other | Admitting: Internal Medicine

## 2016-12-21 VITALS — BP 102/70 | HR 83 | Ht 61.0 in | Wt 214.0 lb

## 2016-12-21 DIAGNOSIS — Z23 Encounter for immunization: Secondary | ICD-10-CM | POA: Diagnosis not present

## 2016-12-21 DIAGNOSIS — R7303 Prediabetes: Secondary | ICD-10-CM | POA: Insufficient documentation

## 2016-12-21 DIAGNOSIS — Z0000 Encounter for general adult medical examination without abnormal findings: Secondary | ICD-10-CM

## 2016-12-21 DIAGNOSIS — D352 Benign neoplasm of pituitary gland: Secondary | ICD-10-CM

## 2016-12-21 DIAGNOSIS — E782 Mixed hyperlipidemia: Secondary | ICD-10-CM

## 2016-12-21 DIAGNOSIS — F319 Bipolar disorder, unspecified: Secondary | ICD-10-CM | POA: Diagnosis not present

## 2016-12-21 DIAGNOSIS — I10 Essential (primary) hypertension: Secondary | ICD-10-CM | POA: Insufficient documentation

## 2016-12-21 DIAGNOSIS — Z1231 Encounter for screening mammogram for malignant neoplasm of breast: Secondary | ICD-10-CM

## 2016-12-21 DIAGNOSIS — M818 Other osteoporosis without current pathological fracture: Secondary | ICD-10-CM

## 2016-12-21 LAB — COMPREHENSIVE METABOLIC PANEL
ALK PHOS: 101 U/L (ref 38–126)
ALT: 61 U/L — AB (ref 14–54)
AST: 35 U/L (ref 15–41)
Albumin: 3.8 g/dL (ref 3.5–5.0)
Anion gap: 7 (ref 5–15)
BILIRUBIN TOTAL: 0.7 mg/dL (ref 0.3–1.2)
BUN: 11 mg/dL (ref 6–20)
CO2: 28 mmol/L (ref 22–32)
CREATININE: 0.67 mg/dL (ref 0.44–1.00)
Calcium: 9.5 mg/dL (ref 8.9–10.3)
Chloride: 105 mmol/L (ref 101–111)
GFR calc non Af Amer: >60 mL/min (ref >60)
GLUCOSE: 114 mg/dL — AB (ref 65–99)
Potassium: 4 mmol/L (ref 3.5–5.1)
SODIUM: 140 mmol/L (ref 135–145)
TOTAL PROTEIN: 7 g/dL (ref 6.5–8.1)

## 2016-12-21 LAB — POCT URINALYSIS DIPSTICK
Bilirubin, UA: NEGATIVE
Blood, UA: NEGATIVE
Glucose, UA: NEGATIVE
KETONES UA: NEGATIVE
LEUKOCYTES UA: NEGATIVE
Nitrite, UA: NEGATIVE
Protein, UA: NEGATIVE
Spec Grav, UA: 1.015 (ref 1.010–1.025)
Urobilinogen, UA: 0.2 E.U./dL
pH, UA: 6.5 (ref 5.0–8.0)

## 2016-12-21 LAB — CBC WITH DIFFERENTIAL/PLATELET
Basophils Absolute: 0.1 10*3/uL (ref 0–0.1)
Basophils Relative: 1 %
EOS ABS: 0.2 10*3/uL (ref 0–0.7)
Eosinophils Relative: 2 %
HEMATOCRIT: 42.4 % (ref 35.0–47.0)
HEMOGLOBIN: 14.5 g/dL (ref 12.0–16.0)
LYMPHS ABS: 1.7 10*3/uL (ref 1.0–3.6)
LYMPHS PCT: 24 %
MCH: 31.2 pg (ref 26.0–34.0)
MCHC: 34.1 g/dL (ref 32.0–36.0)
MCV: 91.5 fL (ref 80.0–100.0)
MONOS PCT: 9 %
Monocytes Absolute: 0.6 10*3/uL (ref 0.2–0.9)
NEUTROS ABS: 4.4 10*3/uL (ref 1.4–6.5)
NEUTROS PCT: 64 %
Platelets: 272 10*3/uL (ref 150–440)
RBC: 4.63 MIL/uL (ref 3.80–5.20)
RDW: 13.5 % (ref 11.5–14.5)
WBC: 6.9 10*3/uL (ref 3.6–11.0)

## 2016-12-21 LAB — LIPID PANEL
Cholesterol: 265 mg/dL — ABNORMAL HIGH (ref 0–200)
HDL: 71 mg/dL (ref >40)
LDL CALC: 168 mg/dL — AB (ref 0–99)
Total CHOL/HDL Ratio: 3.7 RATIO
Triglycerides: 131 mg/dL (ref <150)
VLDL: 26 mg/dL (ref 0–40)

## 2016-12-21 LAB — TSH: TSH: 2.133 u[IU]/mL (ref 0.350–4.500)

## 2016-12-21 NOTE — Patient Instructions (Addendum)
Pneumococcal Conjugate Vaccine (PCV13) What You Need to Know 1. Why get vaccinated? Vaccination can protect both children and adults from pneumococcal disease. Pneumococcal disease is caused by bacteria that can spread from person to person through close contact. It can cause ear infections, and it can also lead to more serious infections of the:  Lungs (pneumonia),  Blood (bacteremia), and  Covering of the brain and spinal cord (meningitis).  Pneumococcal pneumonia is most common among adults. Pneumococcal meningitis can cause deafness and brain damage, and it kills about 1 child in 10 who get it. Anyone can get pneumococcal disease, but children under 2 years of age and adults 65 years and older, people with certain medical conditions, and cigarette smokers are at the highest risk. Before there was a vaccine, the [place] saw:  more than 700 cases of meningitis,  about 13,000 blood infections,  about 5 million ear infections, and  about 200 deaths  in children under 5 each year from pneumococcal disease. Since vaccine became available, severe pneumococcal disease in these children has fallen by 88%. About 18,000 older adults die of pneumococcal disease each year in the [place]. Treatment of pneumococcal infections with penicillin and other drugs is not as effective as it used to be, because some strains of the disease have become resistant to these drugs. This makes prevention of the disease, through vaccination, even more important. 2. PCV13 vaccine Pneumococcal conjugate vaccine (called PCV13) protects against 13 types of pneumococcal bacteria. PCV13 is routinely given to children at 2, 4, 6, and 12-15 months of age. It is also recommended for children and adults 2 to 64 years of age with certain health conditions, and for all adults 65 years of age and older. Your doctor can give you details. 3. Some people should not get this vaccine Anyone who has ever had a  life-threatening allergic reaction to a dose of this vaccine, to an earlier pneumococcal vaccine called PCV7, or to any vaccine containing diphtheria toxoid (for example, DTaP), should not get PCV13. Anyone with a severe allergy to any component of PCV13 should not get the vaccine. Tell your doctor if the person being vaccinated has any severe allergies. If the person scheduled for vaccination is not feeling well, your healthcare provider might decide to reschedule the shot on another day. 4. Risks of a vaccine reaction With any medicine, including vaccines, there is a chance of reactions. These are usually mild and go away on their own, but serious reactions are also possible. Problems reported following PCV13 varied by age and dose in the series. The most common problems reported among children were:  About half became drowsy after the shot, had a temporary loss of appetite, or had redness or tenderness where the shot was given.  About 1 out of 3 had swelling where the shot was given.  About 1 out of 3 had a mild fever, and about 1 in 20 had a fever over 102.2F.  Up to about 8 out of 10 became fussy or irritable.  Adults have reported pain, redness, and swelling where the shot was given; also mild fever, fatigue, headache, chills, or muscle pain. Young children who get PCV13 along with inactivated flu vaccine at the same time may be at increased risk for seizures caused by fever. Ask your doctor for more information. Problems that could happen after any vaccine:  People sometimes faint after a medical procedure, including vaccination. Sitting or lying down for about 15 minutes can help prevent   fainting, and injuries caused by a fall. Tell your doctor if you feel dizzy, or have vision changes or ringing in the ears.  Some older children and adults get severe pain in the shoulder and have difficulty moving the arm where a shot was given. This happens very rarely.  Any medication can cause a  severe allergic reaction. Such reactions from a vaccine are very rare, estimated at about 1 in a million doses, and would happen within a few minutes to a few hours after the vaccination. As with any medicine, there is a very small chance of a vaccine causing a serious injury or death. The safety of vaccines is always being monitored. For more information, visit: http://www.aguilar.org/ 5. What if there is a serious reaction? What should I look for? Look for anything that concerns you, such as signs of a severe allergic reaction, very high fever, or unusual behavior. Signs of a severe allergic reaction can include hives, swelling of the face and throat, difficulty breathing, a fast heartbeat, dizziness, and weakness-usually within a few minutes to a few hours after the vaccination. What should I do?  If you think it is a severe allergic reaction or other emergency that can't wait, call 9-1-1 or get the person to the nearest hospital. Otherwise, call your doctor.  Reactions should be reported to the Vaccine Adverse Event Reporting System (VAERS). Your doctor should file this report, or you can do it yourself through the VAERS web site at www.vaers.SamedayNews.es, or by calling 667-364-4960. ? VAERS does not give medical advice. 6. The National Vaccine Injury Compensation Program The [employer] Vaccine Injury Compensation Program (VICP) is a federal program that was created to compensate people who may have been injured by certain vaccines. Persons who believe they may have been injured by a vaccine can learn about the program and about filing a claim by calling 845-398-8490 or visiting the [place] website at GoldCloset.com.ee. There is a time limit to file a claim for compensation. 7. How can I learn more?  Ask your healthcare provider. He or she can give you the vaccine package insert or suggest other sources of information.  Call your local or state health department.  Contact the  Centers for Disease Control and Prevention (CDC): ? Call 804 030 2710 (1-800-CDC-INFO) or ? Visit CDC's website at http://hunter.com/ Vaccine Information Statement, PCV13 Vaccine (05/10/2014) This information is not intended to replace advice given to you by your health care provider. Make sure you discuss any questions you have with your health care provider. Document Released: 04/19/2006 Document Revised: 03/12/2016 Document Reviewed: 03/12/2016 Elsevier Interactive Patient Education  2017 [place] Maintenance  Topic Date Due  . MAMMOGRAM  12/31/2016  . INFLUENZA VACCINE  02/03/2017  . PAP SMEAR  07/27/2017  . COLONOSCOPY  01/04/2020  . TETANUS/TDAP  02/05/2023  . Hepatitis C Screening  Addressed  . HIV Screening  Addressed

## 2016-12-21 NOTE — Telephone Encounter (Signed)
Pt sent MyChart message about Mammo Referral.

## 2016-12-21 NOTE — Progress Notes (Signed)
Patient: Brandi Bell, Female    DOB: 30-Jan-1961, 56 y.o.   MRN: [id number] Visit Date: 12/21/2016  Today's Provider: Halina Maidens, MD   Chief Complaint  Patient presents with  . Medicare Wellness    Pt needs nexium and valtrex refilled.   . Hypertension  . Hyperlipidemia  . Diabetes   Subjective:    Annual wellness visit Brandi Bell is a 56 y.o. female who presents today for her Subsequent Annual Wellness Visit. She feels poorly. She reports exercising none. She reports she is sleeping poorly.   ----------------------------------------------------------- Hyperlipidemia  This is a chronic problem. Pertinent negatives include no chest pain or shortness of breath.  Diabetes  She presents for her follow-up (pre-diabetes) diabetic visit. Hypoglycemia symptoms include headaches and nervousness/anxiousness. Pertinent negatives for hypoglycemia include no dizziness or seizures. Associated symptoms include fatigue. Pertinent negatives for diabetes include no chest pain, no polydipsia and no polyuria.  Hypertension  Associated symptoms include headaches. Pertinent negatives include no chest pain, palpitations or shortness of breath.  Asthma  She complains of chest tightness and wheezing. There is no cough or shortness of breath. This is a recurrent (hospitalized last month [facility] -) problem. The problem occurs intermittently. Associated symptoms include headaches. Pertinent negatives include no appetite change or chest pain. Her symptoms are alleviated by beta-agonist. Her symptoms are not alleviated by steroid inhaler (stopped symbicort due to no benefit). Her past medical history is significant for asthma.  Gastroesophageal Reflux  She complains of nausea and wheezing. She reports no abdominal pain, no chest pain or no coughing. This is a recurrent problem. Associated symptoms include fatigue. She has tried a PPI for the symptoms.   Lab Results  Component Value Date   CHOL 239 (H)  02/10/2015   HDL 62 02/10/2015   LDLCALC 158 (H) 02/10/2015   TRIG 95 02/10/2015   CHOLHDL 3.9 02/10/2015   Lab Results  Component Value Date   HGBA1C 5.1 02/10/2015     Review of Systems  Constitutional: Positive for fatigue. Negative for appetite change and unexpected weight change.  Respiratory: Positive for chest tightness and wheezing. Negative for cough and shortness of breath.   Cardiovascular: Negative for chest pain, palpitations and leg swelling.  Gastrointestinal: Positive for nausea. Negative for abdominal pain and diarrhea.  Endocrine: Negative for polydipsia and polyuria.  Genitourinary: Positive for vaginal bleeding (one episode after sexual activity). Negative for dysuria, hematuria and vaginal discharge.  Musculoskeletal: Positive for arthralgias and gait problem.  Skin: Negative for rash.  Allergic/Immunologic: Negative for environmental allergies.  Neurological: Positive for headaches. Negative for dizziness, seizures and numbness.  Psychiatric/Behavioral: Positive for decreased concentration, dysphoric mood and sleep disturbance. Negative for suicidal ideas. The patient is nervous/anxious.     Social History   Social History  . Marital status: Divorced    Spouse name: N/A  . Number of children: 3  . Years of education: N/A   Occupational History  . disabled     psychiatric dx   Social History Main Topics  . Smoking status: Never Smoker  . Smokeless tobacco: Never Used  . Alcohol use No  . Drug use: No  . Sexual activity: No   Other Topics Concern  . Not on file   Social History Narrative  . No narrative on file    Patient Active Problem List   Diagnosis Date Noted  . Vitamin D deficiency 07/21/2016  . Hx of suicide attempt 07/19/2016  . PTSD (post-traumatic stress disorder) 07/19/2016  .  Migraine headache 07/19/2016  . RLS (restless legs syndrome) 07/01/2016  . Osteoporosis 07/01/2016  . Trochanteric bursitis 03/17/2016  . Prediabetes  03/17/2016  . Gastritis   . Pituitary adenoma ([place]) 10/02/2015  . Borderline personality disorder 02/10/2015  . Chronic pain syndrome 02/10/2015  . Arthritis 02/10/2015  . Hx of colonic polyps   . Seasonal allergies 12/21/2013  . Fibromyalgia 12/21/2013  . Moderate persistent asthma without complication 38/25/0539  . Chronic nausea 06/20/2013  . Bipolar affective disorder ([place]) 06/20/2013  . Hx of cervical cancer 07/20/2012  . Lumbar herniated disc 07/20/2012  . Hyperlipidemia 07/20/2012  . 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 ANTERIOR AND POSTERIOR REPAIR     done with bladder tact  . BILATERAL SALPINGOOPHORECTOMY  2004   pain from scar tissue  . bladder tact    . CARDIAC CATHETERIZATION  2008   [facility] - neg - report in paper chart  . CATARACT EXTRACTION Bilateral 2007  . CHOLECYSTECTOMY    . COLONOSCOPY N/A 01/04/2015   Procedure: COLONOSCOPY;  Surgeon: Lucilla Lame, MD;  Location: [place];  Service: Gastroenterology;  Laterality: N/A;  with biopsies  . CYSTOSCOPY W/ RETROGRADES Right 10/26/2016   Procedure: CYSTOSCOPY WITH RETROGRADE PYELOGRAM;  Surgeon: Hollice Espy, MD;  Location: [facility];  Service: Urology;  Laterality: Right;  . CYSTOSCOPY WITH STENT PLACEMENT Left 10/26/2016   Procedure: CYSTOSCOPY WITH STENT PLACEMENT;  Surgeon: Hollice Espy, MD;  Location: [facility];  Service: Urology;  Laterality: Left;  . ESOPHAGOGASTRODUODENOSCOPY (EGD) WITH PROPOFOL N/A 12/30/2015   Procedure: ESOPHAGOGASTRODUODENOSCOPY (EGD) WITH PROPOFOL;  Surgeon: Lucilla Lame, MD;  Location: [place];  Service: Endoscopy;  Laterality: N/A;  . FOOT SURGERY     multiple  . SEPTOPLASTY    . SHOULDER ARTHROSCOPY Bilateral 2008  . TONSILLECTOMY  6  . TUBAL LIGATION    . URETEROSCOPY WITH HOLMIUM LASER LITHOTRIPSY Left 10/26/2016   Procedure: URETEROSCOPY WITH HOLMIUM LASER LITHOTRIPSY;  Surgeon: Hollice Espy, MD;  Location: [facility];  Service: Urology;  Laterality: Left;  Marland Kitchen VAGINAL HYSTERECTOMY  1989   cervical cancer age 3    Her family history includes Asthma in her mother; Brain cancer in her brother; Breast cancer in her mother and sister; Diabetes in her mother; Heart attack (age of onset: 36) in her father; Melanoma in her mother.     Previous Medications   ALBUTEROL (PROVENTIL HFA;VENTOLIN HFA) 108 (90 BASE) MCG/ACT INHALER    Inhale 1-2 puffs into the lungs every 6 (six) hours as needed for wheezing or shortness of breath.   CALCIUM CITRATE PO    Take 600 mg by mouth.   CHOLECALCIFEROL (VITAMIN D3) 2000 UNITS TABS    Take 2,000 Units by mouth daily.    CLONAZEPAM (KLONOPIN) 0.5 MG TABLET    Take 0.5 mg by mouth 2 (two) times daily. Am and pm   ESOMEPRAZOLE (NEXIUM) 40 MG CAPSULE    Take 1 capsule (40 mg total) by mouth daily before breakfast.   ILOPERIDONE (FANAPT) 4 MG TABS TABLET    Take 4 mg by mouth 2 (two) times daily.   MULTIPLE VITAMINS-MINERALS (MULTIVITAMIN ADULT PO)    Take 1 tablet by mouth daily.    ONDANSETRON (ZOFRAN-ODT) 8 MG DISINTEGRATING TABLET    Take 1 tablet (8 mg total) by mouth every 8 (eight) hours as needed for nausea or vomiting.   RIZATRIPTAN (MAXALT) 10 MG TABLET  Take 10 mg by mouth as needed for migraine. May repeat in 2 hours if needed   ROPINIROLE (REQUIP) 3 MG TABLET    Take 3 mg by mouth at bedtime.   TRAMADOL (ULTRAM) 50 MG TABLET    Take 100 mg by mouth at bedtime.    VALACYCLOVIR (VALTREX) 500 MG TABLET    Take 500 mg by mouth at bedtime.    VORTIOXETINE HBR (TRINTELLIX) 10 MG TABS    Take 10 mg by mouth every morning.     Patient Care Team: Glean Hess, MD as PCP - General (Family Medicine) Jori Moll (Psychiatry) Lucilla Lame, MD as Consulting Physician (Gastroenterology) Jannet Mantis, MD (Dermatology)      Objective:   Vitals: BP 102/70   Pulse 83   Ht 5\' 1"  (1.549 m)   Wt 214 lb (97.1 kg)   SpO2 98%   BMI  40.43 kg/m   Physical Exam  Constitutional: She is oriented to person, place, and time. She appears well-developed and well-nourished. No distress.  HENT:  Head: Normocephalic and atraumatic.  Right Ear: Tympanic membrane and ear canal normal.  Left Ear: Tympanic membrane and ear canal normal.  Nose: Right sinus exhibits no maxillary sinus tenderness. Left sinus exhibits no maxillary sinus tenderness.  Mouth/Throat: Uvula is midline and oropharynx is clear and moist.  Eyes: Conjunctivae and EOM are normal. Right eye exhibits no discharge. Left eye exhibits no discharge. No scleral icterus.  Neck: Normal range of motion. Carotid bruit is not present. No erythema present. No thyromegaly present.  Cardiovascular: Normal rate, regular rhythm, normal heart sounds and normal pulses.   Pulmonary/Chest: Effort normal. No respiratory distress. She has no wheezes. Right breast exhibits no mass, no nipple discharge, no skin change and no tenderness. Left breast exhibits no mass, no nipple discharge, no skin change and no tenderness.  Abdominal: Soft. Bowel sounds are normal. There is no hepatosplenomegaly. There is no tenderness. There is no CVA tenderness.  Musculoskeletal: Normal range of motion.       Feet:  Lymphadenopathy:    She has no cervical adenopathy.    She has no axillary adenopathy.  Neurological: She is alert and oriented to person, place, and time. She has normal reflexes. No cranial nerve deficit or sensory deficit.  Skin: Skin is warm, dry and intact. No rash noted.  Multiple small mobile soft cysts/lipomas on arms, abdomen, back  Psychiatric: She has a normal mood and affect. Her speech is normal and behavior is normal. Judgment and thought content normal. Cognition and memory are impaired.  Nursing note and vitals reviewed.   Activities of Daily Living In your present state of health, do you have any difficulty performing the following activities: 12/21/2016 10/22/2016  Hearing?  N N  Vision? N N  Difficulty concentrating or making decisions? N N  Walking or climbing stairs? N Y  Dressing or bathing? N N  Doing errands, shopping? N -  Preparing Food and eating ? N -  Using the Toilet? N -  In the past six months, have you accidently leaked urine? N -  Do you have problems with loss of bowel control? N -  Managing your Medications? N -  Managing your Finances? N -  Housekeeping or managing your Housekeeping? N -  Some recent data might be hidden    Fall Risk Assessment Fall Risk  12/21/2016 07/21/2016  Falls in the past year? Yes Yes  Number falls in past yr: 2 or  more 2 or more  Injury with Fall? No -  Risk Factor Category  High Fall Risk High Fall Risk  Risk for fall due to : - Impaired balance/gait      Depression Screen PHQ 2/9 Scores 12/21/2016 07/21/2016  PHQ - 2 Score 5 4  PHQ- 9 Score 14 8   6CIT Screen 12/21/2016  What Year? 0 points  What month? 0 points  What time? 0 points  Count back from 20 0 points  Months in reverse 0 points  Repeat phrase 8 points  Total Score 8     Medicare Annual Wellness Visit Summary:  Reviewed patient's Family Medical History Reviewed and updated list of patient's medical providers Assessment of cognitive impairment was done Assessed patient's functional ability Established a written schedule for health screening [place] Completed and Reviewed  Exercise Activities and Dietary recommendations Goals    None      Immunization History  Administered Date(s) Administered  . Influenza,inj,Quad PF,36+ Mos 03/17/2016  . Pneumococcal Polysaccharide-23 02/03/2013  . Tdap 06/06/2007  . Zoster 10/11/2013, 01/03/2014    Health Maintenance  Topic Date Due  . MAMMOGRAM  12/31/2016  . INFLUENZA VACCINE  02/03/2017  . PAP SMEAR  07/27/2017  . COLONOSCOPY  01/04/2020  . TETANUS/TDAP  02/05/2023  . Hepatitis C Screening  Addressed  . HIV Screening  Addressed    Discussed health  benefits of physical activity, and encouraged her to engage in regular exercise appropriate for her age and condition.    ------------------------------------------------------------------------------------------------------------  Assessment & Plan:  1. Medicare annual wellness visit, subsequent Measures satisfied - POCT urinalysis dipstick  2. Essential hypertension Resolved, no medication needed - TSH  3. Pituitary adenoma ([place]) Check lab; last MRI negative - Prolactin  4. Bipolar affective disorder, remission status unspecified ([place]) Followed by psychiatry  5. Mixed hyperlipidemia Will advise regarding need for medication - CBC with Differential/Platelet - Comprehensive metabolic panel - Lipid panel  6. Prediabetes - Hemoglobin A1c  7. Encounter for screening mammogram for breast cancer Pt wants to schedule Dx mammogram at [place]  8. Need for pneumococcal vaccination - Pneumococcal conjugate vaccine 13-valent IM   No orders of the defined types were placed in this encounter.   Halina Maidens, MD [facility]  12/21/2016

## 2016-12-22 ENCOUNTER — Encounter: Payer: Self-pay | Admitting: Internal Medicine

## 2016-12-22 LAB — PROLACTIN: PROLACTIN: 8.5 ng/mL (ref 4.8–23.3)

## 2016-12-22 LAB — HEMOGLOBIN A1C
HEMOGLOBIN A1C: 5.3 % (ref 4.8–5.6)
MEAN PLASMA GLUCOSE: 105 mg/dL

## 2016-12-27 ENCOUNTER — Encounter: Payer: Self-pay | Admitting: Internal Medicine

## 2017-01-01 LAB — HM MAMMOGRAPHY

## 2017-01-04 ENCOUNTER — Encounter: Payer: Self-pay | Admitting: Internal Medicine

## 2017-01-18 ENCOUNTER — Telehealth: Payer: Self-pay | Admitting: Gastroenterology

## 2017-01-18 NOTE — Telephone Encounter (Signed)
Pt was asking about her Nexium that she has been taking since her 30's. Asked her if she has ever tried stopping it to see if infact she has reflux symptoms and she said no. She will try slowly reducing it every week to see if her symptoms return. Asked her to call me if there are any issues.

## 2017-01-18 NOTE — Telephone Encounter (Signed)
Patient left a voice message that she has a couple of questions to ask you regarding medication

## 2017-01-19 ENCOUNTER — Ambulatory Visit: Payer: Medicare Other | Admitting: Internal Medicine

## 2017-01-19 ENCOUNTER — Telehealth: Payer: Self-pay

## 2017-01-19 ENCOUNTER — Other Ambulatory Visit: Payer: Self-pay

## 2017-01-19 NOTE — Telephone Encounter (Signed)
She just needs to keep her appointment in December.

## 2017-01-19 NOTE — Telephone Encounter (Signed)
Pt called wanting to know if she needed to keep her appt. She cannot take anti-inflammatory of Gabapentin. I called pt and told her per Dr Army Melia that she does not need to keep the appt because if she cannot take the anti-inflammatory including Gabapentin, there is not anything else that can be done for her. "Gabapentin makes me loopy." pt understood

## 2017-01-20 ENCOUNTER — Telehealth: Payer: Self-pay | Admitting: Urology

## 2017-01-20 NOTE — Telephone Encounter (Signed)
Opened in error during chart review.

## 2017-01-21 ENCOUNTER — Encounter: Payer: Self-pay | Admitting: Internal Medicine

## 2017-01-28 ENCOUNTER — Encounter: Payer: Self-pay | Admitting: Internal Medicine

## 2017-02-02 ENCOUNTER — Encounter: Payer: Self-pay | Admitting: Internal Medicine

## 2017-02-08 ENCOUNTER — Encounter: Payer: Self-pay | Admitting: Internal Medicine

## 2017-02-08 DIAGNOSIS — G4733 Obstructive sleep apnea (adult) (pediatric): Secondary | ICD-10-CM | POA: Insufficient documentation

## 2017-03-05 ENCOUNTER — Other Ambulatory Visit: Payer: Self-pay

## 2017-03-05 MED ORDER — VALACYCLOVIR HCL 500 MG PO TABS: 500.0000 mg | ORAL_TABLET | Freq: Every day | ORAL | 5 refills | Status: DC

## 2017-03-06 ENCOUNTER — Encounter: Payer: Self-pay | Admitting: Gastroenterology

## 2017-03-10 ENCOUNTER — Telehealth: Payer: Self-pay | Admitting: Gastroenterology

## 2017-03-10 NOTE — Telephone Encounter (Signed)
Patient called this morning and stated that she has been so sick that she doesn't feel she can wait until her appt. I believe she said she left a message on MY CHART and it was read. Please call [phone]

## 2017-03-10 NOTE — Telephone Encounter (Signed)
Spoke with pt and the original appt was scheduled for the end of September. Brandi Bell called back and moved her appt to tomorrow, 03/11/17 due to a cancellation. Pt stated she will be okay until then.

## 2017-03-11 ENCOUNTER — Encounter: Payer: Self-pay | Admitting: Gastroenterology

## 2017-03-11 ENCOUNTER — Ambulatory Visit (INDEPENDENT_AMBULATORY_CARE_PROVIDER_SITE_OTHER): Payer: Medicare Other | Admitting: Gastroenterology

## 2017-03-11 VITALS — BP 146/82 | HR 86 | Temp 98.8°F | Ht 61.0 in | Wt 218.0 lb

## 2017-03-11 DIAGNOSIS — R110 Nausea: Secondary | ICD-10-CM | POA: Diagnosis not present

## 2017-03-11 NOTE — Progress Notes (Signed)
Primary Care Physician: Glean Hess, MD  Primary Gastroenterologist:  Dr. Lucilla Lame  Chief Complaint  Patient presents with  . Follow up nausea and vomiting    HPI: Brandi Bell is a 56 y.o. female here for nausea and dry heaving.  The patient has a history of nausea but has been well until recently when she started waking up in the morning with a lot of of dry heaving. The patient had a borderline gastric emptying study in the past.  She does not think that her symptoms are related to her stress.  She is presently being treated for restless leg syndrome. She reports that most of her symptoms are in the morning and she has been taking Zofran every 8 hours for her nausea.  There is no report of any unexplained weight loss with this patient's nausea and vomiting and she has gained 3 pounds since her last visit.  Current Outpatient Prescriptions  Medication Sig Dispense Refill  . albuterol (PROVENTIL HFA;VENTOLIN HFA) 108 (90 Base) MCG/ACT inhaler Inhale 1-2 puffs into the lungs every 6 (six) hours as needed for wheezing or shortness of breath. 18 g 5  . ARIPiprazole (ABILIFY) 30 MG tablet Take by mouth.    Marland Kitchen CALCIUM CITRATE PO Take 600 mg by mouth.    . Cholecalciferol (VITAMIN D3) 2000 units TABS Take 2,000 Units by mouth daily.     Marland Kitchen esomeprazole (NEXIUM) 40 MG capsule Take 1 capsule (40 mg total) by mouth daily before breakfast. 90 capsule 3  . ferrous sulfate 325 (65 FE) MG EC tablet Take by mouth.    . Lysine HCl 1000 MG TABS Take by mouth.    . ondansetron (ZOFRAN-ODT) 8 MG disintegrating tablet Take 1 tablet (8 mg total) by mouth every 8 (eight) hours as needed for nausea or vomiting. 90 tablet 1  . rizatriptan (MAXALT) 10 MG tablet Take 10 mg by mouth as needed for migraine. May repeat in 2 hours if needed    . rOPINIRole (REQUIP) 3 MG tablet Take 3 mg by mouth at bedtime.    . rotigotine (NEUPRO) 2 MG/24HR Place onto the skin.    . Suvorexant 10 MG TABS Take by mouth.     . traMADol (ULTRAM) 50 MG tablet Take 100 mg by mouth at bedtime.     . valACYclovir (VALTREX) 500 MG tablet Take 1 tablet (500 mg total) by mouth at bedtime. 30 tablet 5  . vitamin C (ASCORBIC ACID) 500 MG tablet Take by mouth.    . vortioxetine HBr (TRINTELLIX) 10 MG TABS Take 10 mg by mouth every morning.     . Zoledronic Acid (RECLAST IV) Inject into the vein.    . clonazePAM (KLONOPIN) 0.5 MG tablet Take 0.5 mg by mouth 2 (two) times daily. Am and pm    . iloperidone (FANAPT) 4 MG TABS tablet Take 4 mg by mouth 2 (two) times daily.    . Multiple Vitamins-Minerals (MULTIVITAMIN ADULT PO) Take 1 tablet by mouth daily.      No current facility-administered medications for this visit.     Allergies as of 03/11/2017 - Review Complete 12/21/2016  Allergen Reaction Noted  . Ciprofloxacin Anaphylaxis and Other (See Comments) 05/23/2012  . Morphine Nausea And Vomiting 01/23/2013  . Tapentadol Itching 09/12/2012  . Compazine [prochlorperazine edisylate] Rash 11/16/2016  . Haldol [haloperidol] Rash 11/16/2016  . Amoxicillin Hives 05/23/2012  . Morphine and related Nausea And Vomiting 05/23/2012  . Oxybutynin Other (See Comments)  11/02/2016  . Pineapple Other (See Comments) 12/13/2014  . Sulfa antibiotics Hives 12/07/2014  . Tape Other (See Comments) 12/13/2014  . Other Other (See Comments) and Rash 02/16/2015    ROS:  General: Negative for anorexia, weight loss, fever, chills, fatigue, weakness. ENT: Negative for hoarseness, difficulty swallowing , nasal congestion. CV: Negative for chest pain, angina, palpitations, dyspnea on exertion, peripheral edema.  Respiratory: Negative for dyspnea at rest, dyspnea on exertion, cough, sputum, wheezing.  GI: See history of present illness. GU:  Negative for dysuria, hematuria, urinary incontinence, urinary frequency, nocturnal urination.  Endo: Negative for unusual weight change.    Physical Examination:   BP (!) 146/82   Pulse 86   Temp  98.8 F (37.1 C) (Oral)   Ht 5\' 1"  (1.549 m)   Wt 218 lb (98.9 kg)   BMI 41.19 kg/m   General: Well-nourished, well-developed in no acute distress.  Eyes: No icterus. Conjunctivae pink. Mouth: Oropharyngeal mucosa moist and pink , no lesions erythema or exudate. Lungs: Clear to auscultation bilaterally. Non-labored. Heart: Regular rate and rhythm, no murmurs rubs or gallops.  Abdomen: Bowel sounds are normal, nontender, nondistended, no hepatosplenomegaly or masses, no abdominal bruits or hernia , no rebound or guarding.   Extremities: No lower extremity edema. No clubbing or deformities. Neuro: Alert and oriented x 3.  Grossly intact. Skin: Warm and dry, no jaundice.   Psych: Alert and cooperative, normal mood and affect.  Labs:    Imaging Studies: No results found.  Assessment and Plan:   Brandi Bell is a 56 y.o. y/o female who has had nausea for the last few weeks with dry heaving in the morning.  The patient has a history of borderline gastroparesis.  The patient has her symptoms more the morning which may be related to reflux.  The patient has been told to stop her Nexium and will be started on Dexilant in the evening.  She has also been told that if this does not improve her symptoms in the next few weeks she may need a repeat gastric emptying study.  The patient has been explained the plan and agrees with it.    Lucilla Lame, MD. [facility]   Note: This dictation was prepared with Dragon dictation along with smaller phrase technology. Any transcriptional errors that result from this process are unintentional.

## 2017-03-15 ENCOUNTER — Encounter: Payer: Self-pay | Admitting: Gastroenterology

## 2017-03-15 ENCOUNTER — Telehealth: Payer: Self-pay | Admitting: Gastroenterology

## 2017-03-15 NOTE — Telephone Encounter (Signed)
Please set her up for an EGD.

## 2017-03-15 NOTE — Telephone Encounter (Signed)
Please advise 

## 2017-03-15 NOTE — Telephone Encounter (Signed)
Patient called in tears. She stated she is vomiting all the time and exhausted. Please call

## 2017-03-15 NOTE — Telephone Encounter (Signed)
Left vm letting pt know we can schedule her for the EGD on Thursday, Sept 20th at [facility]. Advised her to call me ASAP to be added to the schedule.

## 2017-03-16 ENCOUNTER — Other Ambulatory Visit: Payer: Self-pay

## 2017-03-16 ENCOUNTER — Telehealth: Payer: Self-pay

## 2017-03-16 ENCOUNTER — Encounter: Payer: Self-pay | Admitting: Gastroenterology

## 2017-03-16 DIAGNOSIS — R112 Nausea with vomiting, unspecified: Secondary | ICD-10-CM

## 2017-03-16 NOTE — Telephone Encounter (Signed)
Patient called stating she has been vomiting for over a week - not holding down fluids or food. Pt see's Dr Allen Norris- GI- next week for Endoscopy. Informed pt she needs to be seen at ER ( Dr Allen Norris also told her to go to ER ) - Informed her they will want to give her fluids just incase of dehydration and that itself can make her feel better. She verbalized understanding.

## 2017-03-20 ENCOUNTER — Encounter: Payer: Self-pay | Admitting: Emergency Medicine

## 2017-03-20 ENCOUNTER — Ambulatory Visit
Admission: EM | Admit: 2017-03-20 | Discharge: 2017-03-20 | Disposition: A | Discharge status: Home / Self Care | Payer: Medicare Other | Attending: Family Medicine | Admitting: Family Medicine

## 2017-03-20 DIAGNOSIS — R05 Cough: Secondary | ICD-10-CM | POA: Diagnosis not present

## 2017-03-20 DIAGNOSIS — J4521 Mild intermittent asthma with (acute) exacerbation: Secondary | ICD-10-CM

## 2017-03-20 MED ORDER — GUAIFENESIN-CODEINE 100-10 MG/5ML PO SOLN: 10.0000 mL | Freq: Four times a day (QID) | ORAL | 0 refills | Status: DC | PRN

## 2017-03-20 MED ORDER — PREDNISONE 20 MG PO TABS: ORAL_TABLET | ORAL | 0 refills | Status: DC

## 2017-03-20 NOTE — ED Provider Notes (Signed)
[facility]    CSN: [id number] Arrival date & time: 03/20/17  0800     History   Chief Complaint Chief Complaint  Patient presents with  . Cough    HPI Brandi Bell is a 56 y.o. female.   The history is provided by the patient.  Cough  Associated symptoms: wheezing   URI  Presenting symptoms: congestion and cough   Severity:  Moderate Onset quality:  Sudden Duration:  3 days Timing:  Constant Progression:  Worsening Chronicity:  New Relieved by:  Nothing Ineffective treatments:  Prescription medications (ventolin) Associated symptoms: wheezing   Risk factors: chronic respiratory disease (asthma)   Risk factors: not elderly, no chronic cardiac disease, no chronic kidney disease, no diabetes mellitus, no immunosuppression, no recent illness, no recent travel and no sick contacts     Past Medical History:  Diagnosis Date  . Anemia   . Anxiety   . Arthritis    osteo Back, Feet, Hands  . Asthma    [facility] admission - on Bipap - 7/13 - report on paper chart/ flare 3 mos ago  . Bipolar disorder ([place])   . Bipolar disorder ([place])   . Bulging lumbar disc   . Cancer (HCC)    cervical  . Cataract   . Depression   . Elevated LFTs    having ultrasound [facility] - 12/14/14  . GERD (gastroesophageal reflux disease)   . Headache    migraines, every three or 4 days/ stress related  . Heart attack ([place])   . 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 History of kidney stones   . History of migraine headaches   . Hypertension    Echo -1/16 - report on paper chart/controlled on meds  . IBS (irritable bowel syndrome)   . Microhematuria   . Motion sickness   . Osteoporosis   . Pneumonia    hx of  . PONV (postoperative nausea and vomiting)   . PTSD (post-traumatic stress disorder)   . Restless leg syndrome   . Rhinitis   . Shortness of breath dyspnea    with exertion  . Spinal stenosis of cervical region   . Suicide attempt ([place])   .  Vertigo 6 yrs ago   hx of    Patient Active Problem List   Diagnosis Date Noted  . OSA (obstructive sleep apnea) 02/08/2017  . Vitamin D deficiency 07/21/2016  . Hx of suicide attempt 07/19/2016  . PTSD (post-traumatic stress disorder) 07/19/2016  . Migraine headache 07/19/2016  . RLS (restless legs syndrome) 07/01/2016  . Osteoporosis 07/01/2016  . Trochanteric bursitis 03/17/2016  . Prediabetes 03/17/2016  . Gastritis   . Pituitary adenoma ([place]) 10/02/2015  . Borderline personality disorder 02/10/2015  . Chronic pain syndrome 02/10/2015  . Arthritis 02/10/2015  . Hx of colonic polyps   . Seasonal allergies 12/21/2013  . Fibromyalgia 12/21/2013  . Moderate persistent asthma without complication 82/42/3536  . Chronic nausea 06/20/2013  . Bipolar affective disorder ([place]) 06/20/2013  . Hx of cervical cancer 07/20/2012  . Lumbar herniated disc 07/20/2012  . Hyperlipidemia 07/20/2012  . 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 ANTERIOR AND POSTERIOR REPAIR     done with bladder tact  . BILATERAL SALPINGOOPHORECTOMY  2004   pain from scar tissue  . bladder tact    . CARDIAC CATHETERIZATION  2008   [facility] -  neg - report in paper chart  . CATARACT EXTRACTION Bilateral 2007  . CHOLECYSTECTOMY    . COLONOSCOPY N/A 01/04/2015   Procedure: COLONOSCOPY;  Surgeon: Lucilla Lame, MD;  Location: [place];  Service: Gastroenterology;  Laterality: N/A;  with biopsies  . CYSTOSCOPY W/ RETROGRADES Right 10/26/2016   Procedure: CYSTOSCOPY WITH RETROGRADE PYELOGRAM;  Surgeon: Hollice Espy, MD;  Location: [facility];  Service: Urology;  Laterality: Right;  . CYSTOSCOPY WITH STENT PLACEMENT Left 10/26/2016   Procedure: CYSTOSCOPY WITH STENT PLACEMENT;  Surgeon: Hollice Espy, MD;  Location: [facility];  Service: Urology;  Laterality: Left;  . ESOPHAGOGASTRODUODENOSCOPY (EGD) WITH PROPOFOL N/A 12/30/2015   Procedure:  ESOPHAGOGASTRODUODENOSCOPY (EGD) WITH PROPOFOL;  Surgeon: Lucilla Lame, MD;  Location: [place];  Service: Endoscopy;  Laterality: N/A;  . FOOT SURGERY     multiple  . SEPTOPLASTY    . SHOULDER ARTHROSCOPY Bilateral 2008  . TONSILLECTOMY  6  . TUBAL LIGATION    . URETEROSCOPY WITH HOLMIUM LASER LITHOTRIPSY Left 10/26/2016   Procedure: URETEROSCOPY WITH HOLMIUM LASER LITHOTRIPSY;  Surgeon: Hollice Espy, MD;  Location: [facility];  Service: Urology;  Laterality: Left;  Marland Kitchen VAGINAL HYSTERECTOMY  1989   cervical cancer age 56    OB History    No data available       Home Medications    Prior to Admission medications   Medication Sig Start Date End Date Taking? Authorizing Provider  dexlansoprazole (DEXILANT) 60 MG capsule Take 60 mg by mouth daily.   Yes [provider]  albuterol (PROVENTIL HFA;VENTOLIN HFA) 108 (90 Base) MCG/ACT inhaler Inhale 1-2 puffs into the lungs every 6 (six) hours as needed for wheezing or shortness of breath. 10/06/16   Glean Hess, MD  ARIPiprazole (ABILIFY) 30 MG tablet Take by mouth.    [provider]  CALCIUM CITRATE PO Take 600 mg by mouth.    [provider]  Cholecalciferol (VITAMIN D3) 2000 units TABS Take 2,000 Units by mouth daily.     [provider]  clonazePAM (KLONOPIN) 0.5 MG tablet Take 0.5 mg by mouth 2 (two) times daily. Am and pm    [provider]  ferrous sulfate 325 (65 FE) MG EC tablet Take by mouth.    [provider]  guaiFENesin-codeine 100-10 MG/5ML syrup Take 10 mLs by mouth every 6 (six) hours as needed for cough. 03/20/17   Norval Gable, MD  iloperidone (FANAPT) 4 MG TABS tablet Take 4 mg by mouth 2 (two) times daily.    [provider]  Lysine HCl 1000 MG TABS Take by mouth.    [provider]  Multiple Vitamins-Minerals (MULTIVITAMIN ADULT PO) Take 1 tablet by mouth daily.     [provider]  ondansetron (ZOFRAN-ODT) 8 MG  disintegrating tablet Take 1 tablet (8 mg total) by mouth every 8 (eight) hours as needed for nausea or vomiting. 12/04/16   Lucilla Lame, MD  predniSONE (DELTASONE) 20 MG tablet 3 tabs po qd x 2 days, then 2 tabs po qd for 3 days, then 1 tab po qd for 3 days, then half a tab po qd for 2 days 03/20/17   Norval Gable, MD  rizatriptan (MAXALT) 10 MG tablet Take 10 mg by mouth as needed for migraine. May repeat in 2 hours if needed    [provider]  rOPINIRole (REQUIP) 3 MG tablet Take 4 mg by mouth at bedtime.     [provider]  rotigotine (NEUPRO)  2 MG/24HR Place onto the skin.    [provider]  Suvorexant 10 MG TABS Take by mouth.    [provider]  traMADol (ULTRAM) 50 MG tablet Take 100 mg by mouth at bedtime.     [provider]  valACYclovir (VALTREX) 500 MG tablet Take 1 tablet (500 mg total) by mouth at bedtime. 03/05/17   Glean Hess, MD  vitamin C (ASCORBIC ACID) 500 MG tablet Take by mouth.    [provider]  vortioxetine HBr (TRINTELLIX) 10 MG TABS Take 10 mg by mouth every morning.     [provider]  Zoledronic Acid (RECLAST IV) Inject into the vein. 12/22/16   [provider]    Family History Family History  Problem Relation Age of Onset  . Asthma Mother   . Diabetes Mother   . Breast cancer Mother   . Melanoma Mother   . Heart attack Father 39  . Breast cancer Sister   . Brain cancer Brother   . Kidney cancer Neg Hx   . Bladder Cancer Neg Hx   . Prostate cancer Neg Hx     Social History Social History  Substance Use Topics  . Smoking status: Never Smoker  . Smokeless tobacco: Never Used  . Alcohol use No     Allergies   Ciprofloxacin; Morphine; Tapentadol; Compazine [prochlorperazine edisylate]; Haldol [haloperidol]; Amoxicillin; Morphine and related; Oxybutynin; Pineapple; Sulfa antibiotics; Tape; and Other   Review of Systems Review of Systems  HENT: Positive for congestion.    Respiratory: Positive for cough and wheezing.      Physical Exam Triage Vital Signs ED Triage Vitals  Enc Vitals Group     BP 03/20/17 0814 127/84     Pulse Rate 03/20/17 0814 97     Resp 03/20/17 0814 16     Temp 03/20/17 0814 98.4 F (36.9 C)     Temp Source 03/20/17 0814 Oral     SpO2 03/20/17 0814 100 %     Weight 03/20/17 0810 217 lb (98.4 kg)     Height 03/20/17 0810 5\' 1"  (1.549 m)     Head Circumference --      Peak Flow --      Pain Score 03/20/17 0810 0     Pain Loc --      Pain Edu? --      Excl. in [place]? --    No data found.   Updated Vital Signs BP 127/84 (BP Location: Right Arm)   Pulse 97   Temp 98.4 F (36.9 C) (Oral)   Resp 16   Ht 5\' 1"  (1.549 m)   Wt 217 lb (98.4 kg)   SpO2 100%   BMI 41.00 kg/m   Visual Acuity Right Eye Distance:   Left Eye Distance:   Bilateral Distance:    Right Eye Near:   Left Eye Near:    Bilateral Near:     Physical Exam  Constitutional: She appears well-developed and well-nourished. No distress.  HENT:  Head: Normocephalic and atraumatic.  Right Ear: Tympanic membrane, external ear and ear canal normal.  Left Ear: Tympanic membrane, external ear and ear canal normal.  Nose: Mucosal edema and rhinorrhea present. No nose lacerations, sinus tenderness, nasal deformity, septal deviation or nasal septal hematoma. No epistaxis.  No foreign bodies. Right sinus exhibits maxillary sinus tenderness and frontal sinus tenderness. Left sinus exhibits maxillary sinus tenderness and frontal sinus tenderness.  Mouth/Throat: Uvula is midline, oropharynx is clear and  moist and mucous membranes are normal. No oropharyngeal exudate.  Eyes: Pupils are equal, round, and reactive to light. Conjunctivae and EOM are normal. Right eye exhibits no discharge. Left eye exhibits no discharge. No scleral icterus.  Neck: Normal range of motion. Neck supple. No thyromegaly present.  Cardiovascular: Normal rate, regular rhythm and normal heart  sounds.   Pulmonary/Chest: Effort normal. No respiratory distress. She has wheezes (expiratory, diffuse, bilaterally). She has no rales.  Lymphadenopathy:    She has no cervical adenopathy.  Skin: She is not diaphoretic.  Nursing note and vitals reviewed.    UC Treatments / Results  Labs (all labs ordered are listed, but only abnormal results are displayed) Labs Reviewed - No data to display  EKG  EKG Interpretation None       Radiology No results found.  Procedures Procedures (including critical care time)  Medications Ordered in UC Medications - No data to display   Initial Impression / Assessment and Plan / UC Course  I have reviewed the triage vital signs and the nursing notes.  Pertinent labs & imaging results that were available during my care of the patient were reviewed by me and considered in my medical decision making (see chart for details).       Final Clinical Impressions(s) / UC Diagnoses   Final diagnoses:  Exacerbation of intermittent asthma, unspecified asthma severity    New Prescriptions Discharge Medication List as of 03/20/2017  8:27 AM    START taking these medications   Details  guaiFENesin-codeine 100-10 MG/5ML syrup Take 10 mLs by mouth every 6 (six) hours as needed for cough., Starting Sat 03/20/2017, Print    predniSONE (DELTASONE) 20 MG tablet 3 tabs po qd x 2 days, then 2 tabs po qd for 3 days, then 1 tab po qd for 3 days, then half a tab po qd for 2 days, Normal       1. diagnosis reviewed with patient 2. rx as per orders above; reviewed possible side effects, interactions, risks and benefits  3. Recommend supportive treatment with increased fluids; continue home ventolin/nebulizer treatments 4. Follow-up prn if symptoms worsen or don't improve   Controlled Substance Prescriptions [place] Controlled Substance Registry consulted? No   Norval Gable, MD 03/20/17 [phone]

## 2017-03-20 NOTE — ED Triage Notes (Signed)
Patient c/o cough and chest congestion since Thursday. Patient denies fevers.  Patient reports history of asthma.

## 2017-03-22 ENCOUNTER — Other Ambulatory Visit
Admission: RE | Admit: 2017-03-22 | Discharge: 2017-03-22 | Disposition: A | Discharge status: Home / Self Care | Payer: Medicare Other | Source: Ambulatory Visit | Attending: Internal Medicine | Admitting: Internal Medicine

## 2017-03-22 ENCOUNTER — Encounter: Payer: Self-pay | Admitting: Internal Medicine

## 2017-03-22 ENCOUNTER — Ambulatory Visit (INDEPENDENT_AMBULATORY_CARE_PROVIDER_SITE_OTHER): Payer: Medicare Other | Admitting: Internal Medicine

## 2017-03-22 ENCOUNTER — Other Ambulatory Visit: Payer: Self-pay

## 2017-03-22 ENCOUNTER — Telehealth: Payer: Self-pay | Admitting: Gastroenterology

## 2017-03-22 VITALS — BP 122/78 | HR 94 | Ht 61.0 in | Wt 217.0 lb

## 2017-03-22 DIAGNOSIS — R112 Nausea with vomiting, unspecified: Secondary | ICD-10-CM | POA: Insufficient documentation

## 2017-03-22 DIAGNOSIS — J069 Acute upper respiratory infection, unspecified: Secondary | ICD-10-CM | POA: Diagnosis not present

## 2017-03-22 MED ORDER — DEXLANSOPRAZOLE 60 MG PO CPDR: 60.0000 mg | DELAYED_RELEASE_CAPSULE | Freq: Every day | ORAL | 6 refills | Status: DC

## 2017-03-22 NOTE — Telephone Encounter (Signed)
Patient states she wants to cancel endo for 03/25/17 due to finding the cause for nausea being meat. Pt also states she needs her medication refilled at [facility] in [facility]

## 2017-03-22 NOTE — Progress Notes (Signed)
Date:  03/22/2017   Name:  Brandi Bell   DOB:  07/20/1960   MRN:  [id number]   Chief Complaint: Labs Only (Dr Allen Norris sent patient here to be seen - told to see if can be testing for - Alpha Gal.  Can't eat red meat- vomitting when do. )  Patient's been having issues with ongoing emesis. Usually food that she ate several hours earlier. Inconclusive gastric emptying study last year. She's been seeing GI once had several tests. She is awaiting further suggestions from Dr. Allen Norris. She has been trying to eliminate certain foods from her diet to see what might be exacerbating her symptoms. She does eat a lot of red meat and was suggested she might have sensitivity to mammalian meats.   Emesis   This is a recurrent problem. The problem has been unchanged. The emesis has an appearance of stomach contents. There has been no fever. Associated symptoms include coughing. Pertinent negatives include no abdominal pain, chest pain, chills, diarrhea or fever.  Cough  This is a new problem. The current episode started in the past 7 days. The problem has been gradually improving. The problem occurs every few minutes. The cough is non-productive. Associated symptoms include shortness of breath and wheezing. Pertinent negatives include no chest pain, chills or fever. Treatments tried: seen in UC - given prednisone and cough syrup.     Review of Systems  Constitutional: Negative for chills and fever.  Eyes: Positive for visual disturbance.  Respiratory: Positive for cough, shortness of breath and wheezing.   Cardiovascular: Negative for chest pain and palpitations.  Gastrointestinal: Positive for vomiting. Negative for abdominal distention, abdominal pain and diarrhea.    Patient Active Problem List   Diagnosis Date Noted  . OSA (obstructive sleep apnea) 02/08/2017  . Vitamin D deficiency 07/21/2016  . Hx of suicide attempt 07/19/2016  . PTSD (post-traumatic stress disorder) 07/19/2016  . Migraine  headache 07/19/2016  . RLS (restless legs syndrome) 07/01/2016  . Osteoporosis 07/01/2016  . Trochanteric bursitis 03/17/2016  . Prediabetes 03/17/2016  . Gastritis   . Pituitary adenoma ([place]) 10/02/2015  . Borderline personality disorder 02/10/2015  . Chronic pain syndrome 02/10/2015  . Arthritis 02/10/2015  . Hx of colonic polyps   . Seasonal allergies 12/21/2013  . Fibromyalgia 12/21/2013  . Moderate persistent asthma without complication 22/08/5425  . Chronic nausea 06/20/2013  . Bipolar affective disorder ([place]) 06/20/2013  . Hx of cervical cancer 07/20/2012  . Lumbar herniated disc 07/20/2012  . Hyperlipidemia 07/20/2012  . 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 Authorizing Provider  albuterol (PROVENTIL HFA;VENTOLIN HFA) 108 (90 Base) MCG/ACT inhaler Inhale 1-2 puffs into the lungs every 6 (six) hours as needed for wheezing or shortness of breath. 10/06/16  Yes Glean Hess, MD  ARIPiprazole (ABILIFY) 30 MG tablet Take by mouth.   Yes [provider]  CALCIUM CITRATE PO Take 600 mg by mouth.   Yes [provider]  Cholecalciferol (VITAMIN D3) 2000 units TABS Take 2,000 Units by mouth daily.    Yes [provider]  denosumab (PROLIA) 60 MG/ML SOLN injection Inject 60 mg into the skin every 6 (six) months. Administer in upper arm, thigh, or abdomen   Yes [provider]  dexlansoprazole (DEXILANT) 60 MG capsule Take 60 mg by mouth daily.   Yes [provider]  ferrous sulfate 325 (65 FE) MG  EC tablet Take by mouth.   Yes [provider]  guaiFENesin-codeine 100-10 MG/5ML syrup Take 10 mLs by mouth every 6 (six) hours as needed for cough. 03/20/17  Yes Conty, Linward Foster, MD  Lysine HCl 1000 MG TABS Take by mouth.   Yes [provider]  Multiple Vitamins-Minerals (MULTIVITAMIN ADULT PO) Take 1 tablet by mouth daily.    Yes  [provider]  ondansetron (ZOFRAN-ODT) 8 MG disintegrating tablet Take 1 tablet (8 mg total) by mouth every 8 (eight) hours as needed for nausea or vomiting. 12/04/16  Yes Lucilla Lame, MD  predniSONE (DELTASONE) 20 MG tablet 3 tabs po qd x 2 days, then 2 tabs po qd for 3 days, then 1 tab po qd for 3 days, then half a tab po qd for 2 days 03/20/17  Yes Norval Gable, MD  rizatriptan (MAXALT) 10 MG tablet Take 10 mg by mouth as needed for migraine. May repeat in 2 hours if needed   Yes [provider]  rOPINIRole (REQUIP XL) 4 MG 24 hr tablet Take 4 mg by mouth at bedtime.   Yes [provider]  rosuvastatin (CRESTOR) 5 MG tablet Take 5 mg by mouth daily.   Yes [provider]  traMADol (ULTRAM) 50 MG tablet Take 100 mg by mouth at bedtime.    Yes [provider]  valACYclovir (VALTREX) 500 MG tablet Take 1 tablet (500 mg total) by mouth at bedtime. 03/05/17  Yes Glean Hess, MD  vitamin C (ASCORBIC ACID) 500 MG tablet Take by mouth.   Yes [provider]    Allergies  Allergen Reactions  . Ciprofloxacin Anaphylaxis and Other (See Comments)    Other Reaction: Mouth Swelling Yrs ago  . Morphine Nausea And Vomiting    And vomiting  . Tapentadol Itching  . Compazine [Prochlorperazine Edisylate] Rash  . Haldol [Haloperidol] Rash  . Amoxicillin Hives    ALL CILLINS  . Morphine And Related Nausea And Vomiting  . Oxybutynin Other (See Comments)    Blurred vision   . Pineapple Other (See Comments)    Reaction:  Mouth blisters  . Sulfa Antibiotics Hives  . Tape Other (See Comments)    Reaction:  Clear tape tears skin.  Tegaderm and paper tape are ok.    . Other Other (See Comments) and Rash    Reaction:Clear tape tears skin.  Tegaderm and paper tape are ok. Reaction:Clear tape tears skin.  Tegaderm and paper tape are ok.    Past Surgical History:  Procedure Laterality Date  . ANTERIOR AND POSTERIOR REPAIR     done  with bladder tact  . BILATERAL SALPINGOOPHORECTOMY  2004   pain from scar tissue  . bladder tact    . CARDIAC CATHETERIZATION  2008   [facility] - neg - report in paper chart  . CATARACT EXTRACTION Bilateral 2007  . CHOLECYSTECTOMY    . COLONOSCOPY N/A 01/04/2015   Procedure: COLONOSCOPY;  Surgeon: Lucilla Lame, MD;  Location: [place];  Service: Gastroenterology;  Laterality: N/A;  with biopsies  . CYSTOSCOPY W/ RETROGRADES Right 10/26/2016   Procedure: CYSTOSCOPY WITH RETROGRADE PYELOGRAM;  Surgeon: Hollice Espy, MD;  Location: [facility];  Service: Urology;  Laterality: Right;  . CYSTOSCOPY WITH STENT PLACEMENT Left 10/26/2016   Procedure: CYSTOSCOPY WITH STENT PLACEMENT;  Surgeon: Hollice Espy, MD;  Location: [facility];  Service: Urology;  Laterality: Left;  . ESOPHAGOGASTRODUODENOSCOPY (EGD) WITH PROPOFOL N/A 12/30/2015   Procedure: ESOPHAGOGASTRODUODENOSCOPY (EGD) WITH PROPOFOL;  Surgeon: Lucilla Lame, MD;  Location: [place];  Service: Endoscopy;  Laterality: N/A;  . FOOT SURGERY     multiple  . SEPTOPLASTY    . SHOULDER ARTHROSCOPY Bilateral 2008  . TONSILLECTOMY  6  . TUBAL LIGATION    . URETEROSCOPY WITH HOLMIUM LASER LITHOTRIPSY Left 10/26/2016   Procedure: URETEROSCOPY WITH HOLMIUM LASER LITHOTRIPSY;  Surgeon: Hollice Espy, MD;  Location: [facility];  Service: Urology;  Laterality: Left;  Marland Kitchen VAGINAL HYSTERECTOMY  1989   cervical cancer age 61    Social History  Substance Use Topics  . Smoking status: Never Smoker  . Smokeless tobacco: Never Used  . Alcohol use No     Medication list has been reviewed and updated.  PHQ 2/9 Scores 12/21/2016 07/21/2016  PHQ - 2 Score 5 4  PHQ- 9 Score 14 8    Physical Exam  Constitutional: She is oriented to person, place, and time. She appears well-developed. No distress.  HENT:  Head: Normocephalic and atraumatic.  Neck: Normal range of motion. Neck supple.  Cardiovascular: Normal rate, regular rhythm and normal heart  sounds.   Pulmonary/Chest: Effort normal and breath sounds normal. No respiratory distress. She has no decreased breath sounds. She has no wheezes. She has no rhonchi.  Musculoskeletal: Normal range of motion.  Neurological: She is alert and oriented to person, place, and time.  Skin: Skin is warm and dry. No rash noted.  Psychiatric: She has a normal mood and affect. Her behavior is normal. Thought content normal.  Nursing note and vitals reviewed.   BP 122/78   Pulse 94   Ht 5\' 1"  (1.549 m)   Wt 217 lb (98.4 kg)   SpO2 96%   BMI 41.00 kg/m   Assessment and Plan: 1. Nausea and vomiting, intractability of vomiting not specified, unspecified vomiting type Rule out Alpha gal deficiency Avoid beef and pork if possible - Alpha galactosidase; Future - Alpha galactosidase  2. Viral URI Resolving Finish prednisone taper Return if worsening   No orders of the defined types were placed in this encounter.   Partially dictated using Editor, commissioning. Any errors are unintentional.  Halina Maidens, MD [facility]  03/22/2017

## 2017-03-25 ENCOUNTER — Ambulatory Visit: Admission: RE | Admit: 2017-03-25 | Payer: Medicare Other | Source: Ambulatory Visit | Admitting: Gastroenterology

## 2017-03-25 ENCOUNTER — Encounter: Admission: RE | Payer: Self-pay | Source: Ambulatory Visit

## 2017-03-25 ENCOUNTER — Ambulatory Visit: Payer: Self-pay | Admitting: Gastroenterology

## 2017-03-25 SURGERY — ESOPHAGOGASTRODUODENOSCOPY (EGD) WITH PROPOFOL
Anesthesia: Choice

## 2017-03-26 LAB — ALPHA GALACTOSIDASE: Alpha galactosidase, serum: 76 nmol/hr/mg prt (ref 28.0–80.0)

## 2017-03-27 ENCOUNTER — Encounter: Payer: Self-pay | Admitting: Internal Medicine

## 2017-04-09 ENCOUNTER — Ambulatory Visit: Payer: Medicare Other

## 2017-04-09 ENCOUNTER — Encounter: Payer: Self-pay | Admitting: *Deleted

## 2017-04-09 ENCOUNTER — Encounter: Payer: Self-pay | Admitting: Internal Medicine

## 2017-04-09 ENCOUNTER — Ambulatory Visit
Admission: EM | Admit: 2017-04-09 | Discharge: 2017-04-09 | Disposition: A | Discharge status: Home / Self Care | Payer: Medicare Other | Attending: Family Medicine | Admitting: Family Medicine

## 2017-04-09 ENCOUNTER — Ambulatory Visit: Payer: Medicare Other | Admitting: Internal Medicine

## 2017-04-09 DIAGNOSIS — Y92481 Parking lot as the place of occurrence of the external cause: Secondary | ICD-10-CM | POA: Insufficient documentation

## 2017-04-09 DIAGNOSIS — S8001XA Contusion of right knee, initial encounter: Secondary | ICD-10-CM | POA: Insufficient documentation

## 2017-04-09 DIAGNOSIS — M25561 Pain in right knee: Secondary | ICD-10-CM | POA: Diagnosis present

## 2017-04-09 DIAGNOSIS — S60221A Contusion of right hand, initial encounter: Secondary | ICD-10-CM | POA: Diagnosis not present

## 2017-04-09 MED ORDER — CYCLOBENZAPRINE HCL 10 MG PO TABS: 10.0000 mg | ORAL_TABLET | Freq: Three times a day (TID) | ORAL | 0 refills | Status: DC | PRN
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
## 2017-04-09 NOTE — Discharge Instructions (Signed)
Rest, ice, over the counter advil, aleve or tylenol

## 2017-04-13 ENCOUNTER — Telehealth: Payer: Self-pay

## 2017-04-13 NOTE — Telephone Encounter (Signed)
Let the patient know that this is likely due to weak anal muscle tone and the best way to approach this is by making the stools a bit harder. This can be done with fiber or small doses of liquid Imodium.

## 2017-04-13 NOTE — Telephone Encounter (Signed)
Pt called stating she is having "leaking bowels". No diarrhea. Normal stools but for the last week she is having stool leak out of her rectum. Please advise.

## 2017-04-13 NOTE — Telephone Encounter (Signed)
Pt.notified

## 2017-04-14 ENCOUNTER — Encounter: Payer: Self-pay | Admitting: Internal Medicine

## 2017-04-16 ENCOUNTER — Telehealth: Payer: Self-pay | Admitting: Gastroenterology

## 2017-04-16 ENCOUNTER — Ambulatory Visit (INDEPENDENT_AMBULATORY_CARE_PROVIDER_SITE_OTHER): Payer: Medicare Other | Admitting: Internal Medicine

## 2017-04-16 ENCOUNTER — Other Ambulatory Visit: Payer: Self-pay | Admitting: Internal Medicine

## 2017-04-16 ENCOUNTER — Encounter: Payer: Self-pay | Admitting: Internal Medicine

## 2017-04-16 VITALS — BP 128/82 | HR 91 | Ht 61.0 in | Wt 222.0 lb

## 2017-04-16 DIAGNOSIS — N898 Other specified noninflammatory disorders of vagina: Secondary | ICD-10-CM

## 2017-04-16 DIAGNOSIS — K649 Unspecified hemorrhoids: Secondary | ICD-10-CM

## 2017-04-16 DIAGNOSIS — K623 Rectal prolapse: Secondary | ICD-10-CM

## 2017-04-16 DIAGNOSIS — K2950 Unspecified chronic gastritis without bleeding: Secondary | ICD-10-CM

## 2017-04-16 DIAGNOSIS — B009 Herpesviral infection, unspecified: Secondary | ICD-10-CM

## 2017-04-16 LAB — POCT WET PREP WITH KOH
CLUE CELLS WET PREP PER HPF POC: POSITIVE
KOH Prep POC: NEGATIVE
RBC WET PREP PER HPF POC: 0
TRICHOMONAS UA: NEGATIVE
WBC Wet Prep HPF POC: 5

## 2017-04-16 MED ORDER — METRONIDAZOLE 500 MG PO TABS: 500.0000 mg | ORAL_TABLET | Freq: Two times a day (BID) | ORAL | 0 refills | Status: AC
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
## 2017-04-16 NOTE — Telephone Encounter (Signed)
Leakage with bowels and said she has prolapsed. Who can you recommend? Please call

## 2017-04-16 NOTE — Patient Instructions (Addendum)
Use diaper rash ointment - apply as needed liberally to all the affected areas  Change Valtrex to as needed - for breakout, take 2 tablets twice a day for 1-3 days.

## 2017-04-19 ENCOUNTER — Encounter: Payer: Self-pay | Admitting: Internal Medicine

## 2017-04-19 ENCOUNTER — Other Ambulatory Visit: Payer: Self-pay

## 2017-04-19 DIAGNOSIS — K623 Rectal prolapse: Secondary | ICD-10-CM

## 2017-04-19 NOTE — Telephone Encounter (Signed)
Contacted pt and advised her we dont have a doctor in particular we would refer her to. We would send her to [facility] for a consultation.

## 2017-05-25 ENCOUNTER — Encounter: Payer: Self-pay | Admitting: Gastroenterology

## 2017-05-25 ENCOUNTER — Other Ambulatory Visit: Payer: Self-pay

## 2017-05-25 MED ORDER — DEXLANSOPRAZOLE 60 MG PO CPDR: 60.0000 mg | DELAYED_RELEASE_CAPSULE | Freq: Every day | ORAL | 3 refills | Status: DC

## 2017-06-25 ENCOUNTER — Ambulatory Visit: Payer: Medicare Other | Admitting: Internal Medicine

## 2017-08-10 ENCOUNTER — Other Ambulatory Visit: Payer: Self-pay

## 2017-08-10 DIAGNOSIS — R112 Nausea with vomiting, unspecified: Secondary | ICD-10-CM

## 2017-08-11 NOTE — Discharge Instructions (Signed)
General Anesthesia, Adult, Care After °These instructions provide you with information about caring for yourself after your procedure. Your health care provider may also give you more specific instructions. Your treatment has been planned according to current medical practices, but problems sometimes occur. Call your health care provider if you have any problems or questions after your procedure. °What can I expect after the procedure? °After the procedure, it is common to have: °· Vomiting. °· A sore throat. °· Mental slowness. ° °It is common to feel: °· Nauseous. °· Cold or shivery. °· Sleepy. °· Tired. °· Sore or achy, even in parts of your body where you did not have surgery. ° °Follow these instructions at home: °For at least 24 hours after the procedure: °· Do not: °? Participate in activities where you could fall or become injured. °? Drive. °? Use heavy machinery. °? Drink alcohol. °? Take sleeping pills or medicines that cause drowsiness. °? Make important decisions or sign legal documents. °? Take care of children on your own. °· Rest. °Eating and drinking °· If you vomit, drink water, juice, or soup when you can drink without vomiting. °· Drink enough fluid to keep your urine clear or pale yellow. °· Make sure you have little or no nausea before eating solid foods. °· Follow the diet recommended by your health care provider. °General instructions °· Have a responsible adult stay with you until you are awake and alert. °· Return to your normal activities as told by your health care provider. Ask your health care provider what activities are safe for you. °· Take over-the-counter and prescription medicines only as told by your health care provider. °· If you smoke, do not smoke without supervision. °· Keep all follow-up visits as told by your health care provider. This is important. °Contact a health care provider if: °· You continue to have nausea or vomiting at home, and medicines are not helpful. °· You  cannot drink fluids or start eating again. °· You cannot urinate after 8-12 hours. °· You develop a skin rash. °· You have fever. °· You have increasing redness at the site of your procedure. °Get help right away if: °· You have difficulty breathing. °· You have chest pain. °· You have unexpected bleeding. °· You feel that you are having a life-threatening or urgent problem. °This information is not intended to replace advice given to you by your health care provider. Make sure you discuss any questions you have with your health care provider. °Document Released: 09/28/2000 Document Revised: 11/25/2015 Document Reviewed: 06/06/2015 °Elsevier Interactive Patient Education © 2018 [employer] ° °

## 2017-08-12 ENCOUNTER — Ambulatory Visit: Payer: Medicare HMO | Admitting: Anesthesiology

## 2017-08-12 ENCOUNTER — Encounter
Admission: RE | Disposition: A | Discharge status: Home / Self Care | Payer: Self-pay | Source: Ambulatory Visit | Attending: Gastroenterology

## 2017-08-12 ENCOUNTER — Ambulatory Visit
Admission: RE | Admit: 2017-08-12 | Discharge: 2017-08-12 | Disposition: A | Discharge status: Home / Self Care | Payer: Medicare HMO | Source: Ambulatory Visit | Attending: Gastroenterology | Admitting: Gastroenterology

## 2017-08-12 DIAGNOSIS — K2950 Unspecified chronic gastritis without bleeding: Secondary | ICD-10-CM | POA: Diagnosis not present

## 2017-08-12 DIAGNOSIS — K2970 Gastritis, unspecified, without bleeding: Secondary | ICD-10-CM | POA: Diagnosis not present

## 2017-08-12 DIAGNOSIS — J45909 Unspecified asthma, uncomplicated: Secondary | ICD-10-CM | POA: Diagnosis not present

## 2017-08-12 DIAGNOSIS — K219 Gastro-esophageal reflux disease without esophagitis: Secondary | ICD-10-CM | POA: Diagnosis not present

## 2017-08-12 DIAGNOSIS — Z79899 Other long term (current) drug therapy: Secondary | ICD-10-CM | POA: Insufficient documentation

## 2017-08-12 DIAGNOSIS — I252 Old myocardial infarction: Secondary | ICD-10-CM | POA: Insufficient documentation

## 2017-08-12 DIAGNOSIS — G2581 Restless legs syndrome: Secondary | ICD-10-CM | POA: Insufficient documentation

## 2017-08-12 DIAGNOSIS — F319 Bipolar disorder, unspecified: Secondary | ICD-10-CM | POA: Insufficient documentation

## 2017-08-12 DIAGNOSIS — K589 Irritable bowel syndrome without diarrhea: Secondary | ICD-10-CM | POA: Diagnosis not present

## 2017-08-12 DIAGNOSIS — I10 Essential (primary) hypertension: Secondary | ICD-10-CM | POA: Insufficient documentation

## 2017-08-12 DIAGNOSIS — F4310 Post-traumatic stress disorder, unspecified: Secondary | ICD-10-CM | POA: Insufficient documentation

## 2017-08-12 DIAGNOSIS — G4730 Sleep apnea, unspecified: Secondary | ICD-10-CM | POA: Insufficient documentation

## 2017-08-12 DIAGNOSIS — Z8541 Personal history of malignant neoplasm of cervix uteri: Secondary | ICD-10-CM | POA: Insufficient documentation

## 2017-08-12 DIAGNOSIS — K449 Diaphragmatic hernia without obstruction or gangrene: Secondary | ICD-10-CM | POA: Insufficient documentation

## 2017-08-12 DIAGNOSIS — R112 Nausea with vomiting, unspecified: Secondary | ICD-10-CM | POA: Diagnosis not present

## 2017-08-12 HISTORY — DX: Sleep apnea, unspecified: G47.30

## 2017-08-12 HISTORY — PX: ESOPHAGOGASTRODUODENOSCOPY (EGD) WITH PROPOFOL: SHX5813

## 2017-08-12 SURGERY — ESOPHAGOGASTRODUODENOSCOPY (EGD) WITH PROPOFOL
Anesthesia: General | Site: Throat | Wound class: Clean Contaminated

## 2017-08-12 MED ORDER — LACTATED RINGERS IV SOLN
INTRAVENOUS | Status: DC
  Administered 2017-08-12: 11:00:00 via INTRAVENOUS

## 2017-08-12 MED ORDER — GLYCOPYRROLATE 0.2 MG/ML IJ SOLN
INTRAMUSCULAR | Status: DC | PRN
  Administered 2017-08-12: 0.1 mg via INTRAVENOUS

## 2017-08-12 MED ORDER — PROPOFOL 10 MG/ML IV BOLUS
INTRAVENOUS | Status: DC | PRN
Start: 2017-08-12 — End: 2017-08-12

## 2017-08-12 MED ORDER — LACTATED RINGERS IV SOLN: 10.0000 mL/h | INTRAVENOUS | Status: DC

## 2017-08-12 MED ORDER — DIPHENHYDRAMINE HCL 50 MG/ML IJ SOLN
12.5000 mg | Freq: Once | INTRAMUSCULAR | Status: AC
  Administered 2017-08-12: 12.5 mg via INTRAVENOUS

## 2017-08-12 MED ORDER — OXYCODONE HCL 5 MG/5ML PO SOLN: 5.0000 mg | Freq: Once | ORAL | Status: DC | PRN

## 2017-08-12 MED ORDER — FENTANYL CITRATE (PF) 100 MCG/2ML IJ SOLN: 25.0000 ug | INTRAMUSCULAR | Status: DC | PRN

## 2017-08-12 MED ORDER — OXYCODONE HCL 5 MG PO TABS: 5.0000 mg | ORAL_TABLET | Freq: Once | ORAL | Status: DC | PRN

## 2017-08-12 MED ORDER — SODIUM CHLORIDE 0.9 % IV SOLN: INTRAVENOUS | Status: DC

## 2017-08-12 MED ORDER — STERILE WATER FOR IRRIGATION IR SOLN
Status: DC | PRN
  Administered 2017-08-12: 12:00:00

## 2017-08-12 MED ORDER — MEPERIDINE HCL 25 MG/ML IJ SOLN: 6.2500 mg | INTRAMUSCULAR | Status: DC | PRN

## 2017-08-12 MED ORDER — LIDOCAINE HCL (CARDIAC) 20 MG/ML IV SOLN
INTRAVENOUS | Status: DC | PRN
  Administered 2017-08-12: 80 mg via INTRAVENOUS

## 2017-08-12 MED ORDER — ONDANSETRON HCL 4 MG/2ML IJ SOLN
4.0000 mg | Freq: Once | INTRAMUSCULAR | Status: AC
  Administered 2017-08-12: 4 mg via INTRAVENOUS

## 2017-08-12 MED ORDER — PROPOFOL 10 MG/ML IV BOLUS
INTRAVENOUS | Status: DC | PRN
  Administered 2017-08-12: 150 mg via INTRAVENOUS
  Administered 2017-08-12 (×2): 50 mg via INTRAVENOUS

## 2017-08-12 SURGICAL SUPPLY — 7 items
BLOCK BITE 60FR ADLT L/F GRN (MISCELLANEOUS) ×2 IMPLANT
CANISTER SUCT 1200ML W/VALVE (MISCELLANEOUS) ×2 IMPLANT
FORCEPS BIOP RAD 4 LRG CAP 4 (CUTTING FORCEPS) ×1 IMPLANT
GOWN CVR UNV OPN BCK APRN NK (MISCELLANEOUS) ×2 IMPLANT
GOWN ISOL THUMB LOOP REG UNIV (MISCELLANEOUS) ×4
KIT ENDO PROCEDURE OLY (KITS) ×2 IMPLANT
WATER STERILE IRR 250ML POUR (IV SOLUTION) ×2 IMPLANT

## 2017-08-12 NOTE — Anesthesia Preprocedure Evaluation (Signed)
Anesthesia Evaluation  Patient identified by MRN, date of birth, ID band Patient awake    Reviewed: Allergy & Precautions, H&P , NPO status , Patient's Chart, lab work & pertinent test results, reviewed documented beta blocker date and time   History of Anesthesia Complications Negative for: history of anesthetic complications  Airway Mallampati: II  TM Distance: >3 FB Neck ROM: full    Dental no notable dental hx.    Pulmonary shortness of breath, asthma , sleep apnea and Continuous Positive Airway Pressure Ventilation , pneumonia,    Pulmonary exam normal breath sounds clear to auscultation       Cardiovascular Exercise Tolerance: Good hypertension, Normal cardiovascular exam+ Valvular Problems/Murmurs  Rhythm:regular Rate:Normal  Hx Atypical chest pain > neg. Cardiac workup.   Neuro/Psych  Headaches, PSYCHIATRIC DISORDERS Ptsd; bipolarnegative neurological ROS  negative psych ROS   GI/Hepatic Neg liver ROS, GERD  Medicated,  Endo/Other  negative endocrine ROSMorbid obesity (bmi=39)  Renal/GU negative Renal ROS  negative genitourinary   Musculoskeletal  (+) Arthritis , Spinal stenosis of cervical region   Abdominal   Peds  Hematology  (+) anemia , Cervical  Cancer > TAH   Anesthesia Other Findings   Reproductive/Obstetrics negative OB ROS                             Anesthesia Physical  Anesthesia Plan  ASA: II  Anesthesia Plan: General   Post-op Pain Management:    Induction:   PONV Risk Score and Plan:   Airway Management Planned:   Additional Equipment:   Intra-op Plan:   Post-operative Plan:   Informed Consent: I have reviewed the patients History and Physical, chart, labs and discussed the procedure including the risks, benefits and alternatives for the proposed anesthesia with the patient or authorized representative who has indicated his/her understanding and  acceptance.   Dental Advisory Given  Plan Discussed with: CRNA  Anesthesia Plan Comments:         Anesthesia Quick Evaluation

## 2017-08-12 NOTE — Anesthesia Procedure Notes (Signed)
Performed by: Raea Magallon, CRNA Pre-anesthesia Checklist: Patient identified, Emergency Drugs available, Suction available, Timeout performed and Patient being monitored Patient Re-evaluated:Patient Re-evaluated prior to induction Oxygen Delivery Method: Nasal cannula Placement Confirmation: positive ETCO2       

## 2017-08-12 NOTE — Transfer of Care (Signed)
Immediate Anesthesia Transfer of Care Note  Patient: Keller Mikels  Procedure(s) Performed: ESOPHAGOGASTRODUODENOSCOPY (EGD) WITH PROPOFOL (N/A Throat)  Patient Location: PACU  Anesthesia Type: General  Level of Consciousness: awake, alert  and patient cooperative  Airway and Oxygen Therapy: Patient Spontanous Breathing and Patient connected to supplemental oxygen  Post-op Assessment: Post-op Vital signs reviewed, Patient's Cardiovascular Status Stable, Respiratory Function Stable, Patent Airway and No signs of Nausea or vomiting  Post-op Vital Signs: Reviewed and stable  Complications: No apparent anesthesia complications

## 2017-08-12 NOTE — Op Note (Signed)
[facility] Patient Name: Brandi Bell Procedure Date: 08/12/2017 11:34 AM MRN: [id number] Account #: [id number] Date of Birth: [birth year] Admit Type: Outpatient Age: 57 Room: [facility] OR ROOM 01 Gender: Female Note Status: Finalized Procedure:            Upper GI endoscopy Indications:          Nausea with vomiting Providers:            Lucilla Lame MD, MD Referring MD:         Halina Maidens, MD (Referring MD) Medicines:            Propofol per Anesthesia Complications:        No immediate complications. Procedure:            Pre-Anesthesia Assessment:                       - Prior to the procedure, a History and Physical was                        performed, and patient medications and allergies were                        reviewed. The patient's tolerance of previous                        anesthesia was also reviewed. The risks and benefits of                        the procedure and the sedation options and risks were                        discussed with the patient. All questions were                        answered, and informed consent was obtained. Prior                        Anticoagulants: The patient has taken no previous                        anticoagulant or antiplatelet agents. ASA Grade                        Assessment: II - A patient with mild systemic disease.                        After reviewing the risks and benefits, the patient was                        deemed in satisfactory condition to undergo the                        procedure.                       After obtaining informed consent, the endoscope was                        passed under direct vision. Throughout the procedure,  the patient's blood pressure, pulse, and oxygen                        saturations were monitored continuously. The Olympus                        GIF-HQ190 Endoscope (S#. [id number]) was introduced   through the mouth, and advanced to the second part of                        duodenum. The upper GI endoscopy was accomplished                        without difficulty. The patient tolerated the procedure                        well. Findings:      A small hiatal hernia was present.      Localized minimal inflammation characterized by erythema was found in       the gastric antrum. Biopsies were taken with a cold forceps for       histology.      The examined duodenum was normal. Impression:           - Small hiatal hernia.                       - Gastritis. Biopsied.                       - Normal examined duodenum. Recommendation:       - Discharge patient to home.                       - Resume previous diet.                       - Continue present medications.                       - Await pathology results. Procedure Code(s):    --- Professional ---                       [phone], Esophagogastroduodenoscopy, flexible, transoral;                        with biopsy, single or multiple Diagnosis Code(s):    --- Professional ---                       R11.2, Nausea with vomiting, unspecified                       K29.70, Gastritis, unspecified, without bleeding CPT copyright 2016 American Medical Association. All rights reserved. The codes documented in this report are preliminary and upon coder review may  be revised to meet current compliance requirements. Lucilla Lame MD, MD 08/12/2017 12:19:59 PM This report has been signed electronically. Number of Addenda: 0 Note Initiated On: 08/12/2017 11:34 AM Total Procedure Duration: 0 hours 5 minutes 35 seconds       [facility]

## 2017-08-12 NOTE — Anesthesia Postprocedure Evaluation (Signed)
Anesthesia Post Note  Patient: Brandi Bell  Procedure(s) Performed: ESOPHAGOGASTRODUODENOSCOPY (EGD) WITH PROPOFOL (N/A Throat)  Patient location during evaluation: PACU Anesthesia Type: General Level of consciousness: awake and alert Pain management: pain level controlled Vital Signs Assessment: post-procedure vital signs reviewed and stable Respiratory status: spontaneous breathing, nonlabored ventilation, respiratory function stable and patient connected to nasal cannula oxygen Cardiovascular status: blood pressure returned to baseline and stable Postop Assessment: no apparent nausea or vomiting Anesthetic complications: no    SCOURAS, NICOLE ELAINE

## 2017-08-12 NOTE — H&P (Signed)
Lucilla Lame, MD [address] [place], [place] [address] [phone] Fax : [phone]  Primary Care Physician:  Minda Ditto, MD Primary Gastroenterologist:  Dr. Allen Norris  Pre-Procedure History & Physical: HPI:  Brandi Bell is a 57 y.o. female is here for an endoscopy.   Past Medical History:  Diagnosis Date  . Anemia    hx of  . Anxiety   . Arthritis    osteo Back, Feet, Hands  . Asthma    [facility] admission - on Bipap - 7/13 - report on paper chart/ flare 3 mos ago  . Bipolar disorder ([place])   . Bipolar disorder ([place])   . Bulging lumbar disc   . Cancer ([facility])    cervical  . Cataract   . Depression   . Elevated LFTs    having ultrasound [facility] - 12/14/14  . GERD (gastroesophageal reflux disease)    vomiting  . Headache    migraines, every three or 4 days/ stress related  . Heart attack ([place])    hx of/ Dr. Flonnie Overman cardiologist  . 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 History of kidney stones   . History of migraine headaches   . Hypertension    Echo -1/16 - report on paper chart/controlled on meds  . IBS (irritable bowel syndrome)   . Microhematuria   . Motion sickness   . Osteoporosis   . Pneumonia    hx of  . PONV (postoperative nausea and vomiting)   . PTSD (post-traumatic stress disorder)   . Restless leg syndrome   . Rhinitis   . Shortness of breath dyspnea    with exertion  . Sleep apnea    central and obstructive/ CPAP  . Spinal stenosis of cervical region   . Suicide attempt ([place])   . Vertigo 6 yrs ago   hx of    Past Surgical History:  Procedure Laterality Date  . ANTERIOR AND POSTERIOR REPAIR  2002   done with bladder tact  . BILATERAL SALPINGOOPHORECTOMY  2004   pain from scar tissue  . CARDIAC CATHETERIZATION  2008   [facility] - neg - report in paper chart  . CATARACT EXTRACTION Bilateral 2007  . CHOLECYSTECTOMY    . COLONOSCOPY N/A 01/04/2015   Procedure: COLONOSCOPY;  Surgeon: Lucilla Lame,  MD;  Location: [place];  Service: Gastroenterology;  Laterality: N/A;  with biopsies  . CYSTOSCOPY W/ RETROGRADES Right 10/26/2016   Procedure: CYSTOSCOPY WITH RETROGRADE PYELOGRAM;  Surgeon: Hollice Espy, MD;  Location: [facility];  Service: Urology;  Laterality: Right;  . CYSTOSCOPY WITH STENT PLACEMENT Left 10/26/2016   Procedure: CYSTOSCOPY WITH STENT PLACEMENT;  Surgeon: Hollice Espy, MD;  Location: [facility];  Service: Urology;  Laterality: Left;  . ESOPHAGOGASTRODUODENOSCOPY (EGD) WITH PROPOFOL N/A 12/30/2015   Procedure: ESOPHAGOGASTRODUODENOSCOPY (EGD) WITH PROPOFOL;  Surgeon: Lucilla Lame, MD;  Location: [place];  Service: Endoscopy;  Laterality: N/A;  . FOOT SURGERY     multiple  . SEPTOPLASTY    . SHOULDER ARTHROSCOPY Bilateral 2008  . TONSILLECTOMY  6  . TUBAL LIGATION    . URETEROSCOPY WITH HOLMIUM LASER LITHOTRIPSY Left 10/26/2016   Procedure: URETEROSCOPY WITH HOLMIUM LASER LITHOTRIPSY;  Surgeon: Hollice Espy, MD;  Location: [facility];  Service: Urology;  Laterality: Left;  Marland Kitchen VAGINAL HYSTERECTOMY  1989   cervical cancer age 59    Prior to Admission medications   Medication Sig Start Date End  Date Taking? Authorizing Provider  ARIPiprazole (ABILIFY) 30 MG tablet Take 15 mg by mouth daily. am   Yes [provider]  Cholecalciferol (VITAMIN D3) 2000 units TABS Take 2,000 Units by mouth daily.    Yes [provider]  dexlansoprazole (DEXILANT) 60 MG capsule Take 1 capsule (60 mg total) daily by mouth. Patient taking differently: Take 60 mg by mouth daily. am 05/25/17  Yes Lucilla Lame, MD  Multiple Vitamins-Minerals (MULTIVITAMIN ADULT PO) Take 1 tablet by mouth daily. Centrum silver   Yes [provider]  ondansetron (ZOFRAN-ODT) 8 MG disintegrating tablet Take 1 tablet (8 mg total) by mouth every 8 (eight) hours as needed for nausea or vomiting. 12/04/16  Yes Lucilla Lame, MD  rizatriptan (MAXALT) 10 MG tablet Take 10 mg by mouth  as needed for migraine. May repeat in 2 hours if needed   Yes [provider]  Ropinirole HCl (REQUIP XL) 6 MG TB24 Take by mouth daily. bedtime   Yes [provider]  rosuvastatin (CRESTOR) 5 MG tablet Take 5 mg by mouth daily. am   Yes [provider]  traMADol (ULTRAM) 50 MG tablet Take 100 mg by mouth at bedtime.    Yes [provider]  valACYclovir (VALTREX) 500 MG tablet Take 1 tablet (500 mg total) by mouth at bedtime. 03/05/17  Yes Glean Hess, MD  VENTOLIN HFA 108 (90 Base) MCG/ACT inhaler INHALE 1-2 PUFFS INTO THE  LUNGS EVERY 6 HOURS AS  NEEDED FOR WHEEZING OR  SHORTNESS OF BREATH. 04/16/17  Yes Glean Hess, MD  CALCIUM CITRATE PO Take 600 mg by mouth.    [provider]  cyclobenzaprine (FLEXERIL) 10 MG tablet Take 1 tablet (10 mg total) by mouth 3 (three) times daily as needed for muscle spasms. Patient not taking: Reported on 08/10/2017 04/09/17   Norval Gable, MD  ferrous sulfate 325 (65 FE) MG EC tablet Take by mouth.    [provider]  mirtazapine (REMERON) 7.5 MG tablet Take 7.5 mg by mouth at bedtime.    [provider]  atorvastatin (LIPITOR) 10 MG tablet Takes 1/2 tablet daily at bedtime.  12/28/14  [provider]  estradiol (ESTRACE) 0.5 MG tablet Take 0.5 mg by mouth daily.  12/28/14  [provider]  ranitidine (ZANTAC) 150 MG capsule Take 150 mg by mouth 2 (two) times daily.  12/28/14  [provider]  SUMAtriptan (IMITREX) 100 MG tablet Take 100 mg by mouth as needed.  12/28/14  [provider]    Allergies as of 08/10/2017 - Review Complete 08/10/2017  Allergen Reaction Noted  . Ciprofloxacin Anaphylaxis and Other (See Comments) 05/23/2012  . Morphine Nausea And Vomiting 01/23/2013  . Tapentadol Itching 09/12/2012  . Compazine [prochlorperazine edisylate] Rash 11/16/2016  . Haldol [haloperidol] Rash 11/16/2016  . Amoxicillin Hives 05/23/2012  . Morphine and  related Nausea And Vomiting 05/23/2012  . Oxybutynin Other (See Comments) 11/02/2016  . Pineapple Other (See Comments) 12/13/2014  . Sulfa antibiotics Hives 12/07/2014  . Tape Other (See Comments) 12/13/2014  . Other Other (See Comments) and Rash 02/16/2015    Family History  Problem Relation Age of Onset  . Asthma Mother   . Diabetes Mother   . Breast cancer Mother   . Melanoma Mother   . Heart attack Father 49  . Breast cancer Sister   . Brain cancer Brother   . Kidney cancer Neg Hx   . Bladder Cancer Neg Hx   . Prostate  cancer Neg Hx     Social History   Socioeconomic History  . Marital status: Divorced    Spouse name: Not on file  . Number of children: 3  . Years of education: Not on file  . Highest education level: Not on file  Social Needs  . Financial resource strain: Not on file  . Food insecurity - worry: Not on file  . Food insecurity - inability: Not on file  . Transportation needs - medical: Not on file  . Transportation needs - non-medical: Not on file  Occupational History  . Occupation: disabled    Comment: psychiatric dx  Tobacco Use  . Smoking status: Never Smoker  . Smokeless tobacco: Never Used  Substance and Sexual Activity  . Alcohol use: No  . Drug use: No  . Sexual activity: Yes    Partners: Female    Birth control/protection: Surgical  Other Topics Concern  . Not on file  Social History Narrative  . Not on file    Review of Systems: See HPI, otherwise negative ROS  Physical Exam: BP (!) 132/91   Pulse 79   Temp (!) 97.2 F (36.2 C) (Temporal)   Resp 16   Ht 5\' 1"  (1.549 m)   Wt 221 lb (100.2 kg)   SpO2 96%   BMI 41.76 kg/m  General:   Alert,  pleasant and cooperative in NAD Head:  Normocephalic and atraumatic. Neck:  Supple; no masses or thyromegaly. Lungs:  Clear throughout to auscultation.    Heart:  Regular rate and rhythm. Abdomen:  Soft, nontender and nondistended. Normal bowel sounds, without guarding, and without  rebound.   Neurologic:  Alert and  oriented x4;  grossly normal neurologically.  Impression/Plan: Brandi Bell is here for an endoscopy to be performed for nausea and vomiting.  Risks, benefits, limitations, and alternatives regarding  endoscopy have been reviewed with the patient.  Questions have been answered.  All parties agreeable.   Lucilla Lame, MD  08/12/2017, 12:04 PM

## 2017-08-13 ENCOUNTER — Encounter: Payer: Self-pay | Admitting: Gastroenterology

## 2017-08-19 ENCOUNTER — Encounter: Payer: Self-pay | Admitting: Gastroenterology

## 2017-08-19 ENCOUNTER — Telehealth: Payer: Self-pay | Admitting: *Deleted

## 2017-08-19 NOTE — Telephone Encounter (Signed)
Patient looking for results from Biopsy

## 2017-08-19 NOTE — Telephone Encounter (Signed)
Pt has been notified of EGD results. Pt has also been mailed a letter with results.

## 2017-09-08 ENCOUNTER — Other Ambulatory Visit: Payer: Self-pay

## 2017-09-08 ENCOUNTER — Telehealth: Payer: Self-pay | Admitting: Gastroenterology

## 2017-09-08 MED ORDER — FAMOTIDINE 40 MG PO TABS: 40.0000 mg | ORAL_TABLET | Freq: Every day | ORAL | 6 refills | Status: DC

## 2017-09-08 NOTE — Telephone Encounter (Signed)
Pt's insurance will no longer cover Dexilant 60mg . Pt discussed with insurance to see what they would cover. They will cover Famotidine 40mg . New rx sent to [facility], [place].

## 2017-09-08 NOTE — Telephone Encounter (Signed)
Pt is calling for Ginger she has a question about her rx if she could get called in  rx famotidine 40 mg her insurance will no longer pay for it.

## 2017-12-10 ENCOUNTER — Ambulatory Visit: Payer: Medicare Other | Admitting: Urology

## 2018-01-19 ENCOUNTER — Other Ambulatory Visit: Payer: Self-pay

## 2018-01-19 ENCOUNTER — Encounter: Payer: Self-pay | Admitting: Gastroenterology

## 2018-01-19 MED ORDER — ONDANSETRON 8 MG PO TBDP: 8.0000 mg | ORAL_TABLET | Freq: Three times a day (TID) | ORAL | 3 refills | Status: DC | PRN

## 2018-03-09 ENCOUNTER — Other Ambulatory Visit: Payer: Self-pay

## 2018-03-09 ENCOUNTER — Encounter: Payer: Self-pay | Admitting: Emergency Medicine

## 2018-03-09 ENCOUNTER — Ambulatory Visit
Admission: EM | Admit: 2018-03-09 | Discharge: 2018-03-09 | Disposition: A | Discharge status: Home / Self Care | Payer: Medicare HMO | Attending: Family Medicine | Admitting: Family Medicine

## 2018-03-09 DIAGNOSIS — G8929 Other chronic pain: Secondary | ICD-10-CM | POA: Diagnosis not present

## 2018-03-09 DIAGNOSIS — M5442 Lumbago with sciatica, left side: Secondary | ICD-10-CM | POA: Diagnosis not present

## 2018-03-09 MED ORDER — HYDROCODONE-ACETAMINOPHEN 5-325 MG PO TABS: 1.0000 | ORAL_TABLET | Freq: Three times a day (TID) | ORAL | 0 refills | Status: DC | PRN

## 2018-03-09 MED ORDER — PREDNISONE 10 MG (21) PO TBPK: ORAL_TABLET | ORAL | 0 refills | Status: DC

## 2018-03-09 MED ORDER — METHYLPREDNISOLONE SODIUM SUCC 40 MG IJ SOLR
80.0000 mg | Freq: Once | INTRAMUSCULAR | Status: AC
  Administered 2018-03-09: 80 mg via INTRAMUSCULAR

## 2018-03-09 NOTE — ED Provider Notes (Addendum)
[facility]    CSN: [id number] Arrival date & time: 03/09/18  1029  History   Chief Complaint Chief Complaint  Patient presents with  . Back Pain   HPI  57 year old female with chronic pain presents with worsening/acute on chronic back pain.  Patient has chronic back pain.  Patient states that this morning she developed worsening pain which is shooting down her left leg.  Pain is severe, currently 7/10 in severity.  She is taking ibuprofen without improvement.  She takes tramadol in the evening for restless leg.  She spoke with her pain medicine physician and she was encouraged to come in for evaluation.  Patient states that she is very uncomfortable.  She is in a great deal of pain.  The radicular pain is quite sharp.  No other associated symptoms.  No other complaints.  PMH, Surgical Hx, Family Hx, Social History reviewed and updated as below.  Past Medical History:  Diagnosis Date  . Anemia    hx of  . Anxiety   . Arthritis    osteo Back, Feet, Hands  . Asthma    [facility] admission - on Bipap - 7/13 - report on paper chart/ flare 3 mos ago  . Bipolar disorder ([place])   . Bipolar disorder ([place])   . Bulging lumbar disc   . Cancer (HCC)    cervical  . Cataract   . Depression   . Elevated LFTs    having ultrasound [facility] - 12/14/14  . GERD (gastroesophageal reflux disease)    vomiting  . Headache    migraines, every three or 4 days/ stress related  . Heart attack ([place])    hx of/ Dr. Flonnie Overman cardiologist  . 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 History of kidney stones   . History of migraine headaches   . Hypertension    Echo -1/16 - report on paper chart/controlled on meds  . IBS (irritable bowel syndrome)   . Microhematuria   . Motion sickness   . Osteoporosis   . Pneumonia    hx of  . PONV (postoperative nausea and vomiting)   . PTSD (post-traumatic stress disorder)   . Restless leg syndrome   . Rhinitis   . Shortness of  breath dyspnea    with exertion  . Sleep apnea    central and obstructive/ CPAP  . Spinal stenosis of cervical region   . Suicide attempt ([place])   . Vertigo 6 yrs ago   hx of    Patient Active Problem List   Diagnosis Date Noted  . Nausea and vomiting   . OSA (obstructive sleep apnea) 02/08/2017  . Vitamin D deficiency 07/21/2016  . Hx of suicide attempt 07/19/2016  . PTSD (post-traumatic stress disorder) 07/19/2016  . Migraine headache 07/19/2016  . RLS (restless legs syndrome) 07/01/2016  . Osteoporosis 07/01/2016  . Trochanteric bursitis 03/17/2016  . Prediabetes 03/17/2016  . Gastritis   . Pituitary adenoma ([place]) 10/02/2015  . Borderline personality disorder ([place]) 02/10/2015  . Chronic pain syndrome 02/10/2015  . Arthritis 02/10/2015  . Hx of colonic polyps   . Seasonal allergies 12/21/2013  . Fibromyalgia 12/21/2013  . Moderate persistent asthma without complication 70/96/2836  . Chronic nausea 06/20/2013  . Bipolar affective disorder ([place]) 06/20/2013  . Hx of cervical cancer 07/20/2012  . Lumbar herniated disc 07/20/2012  . Hyperlipidemia 07/20/2012  . 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 ANTERIOR AND POSTERIOR REPAIR  2002   done with bladder tact  . BILATERAL SALPINGOOPHORECTOMY  2004   pain from scar tissue  . CARDIAC CATHETERIZATION  2008   [facility] - neg - report in paper chart  . CATARACT EXTRACTION Bilateral 2007  . CHOLECYSTECTOMY    . COLONOSCOPY N/A 01/04/2015   Procedure: COLONOSCOPY;  Surgeon: Lucilla Lame, MD;  Location: [place];  Service: Gastroenterology;  Laterality: N/A;  with biopsies  . CYSTOSCOPY W/ RETROGRADES Right 10/26/2016   Procedure: CYSTOSCOPY WITH RETROGRADE PYELOGRAM;  Surgeon: Hollice Espy, MD;  Location: [facility];  Service: Urology;  Laterality: Right;  . CYSTOSCOPY WITH STENT PLACEMENT Left 10/26/2016   Procedure: CYSTOSCOPY WITH STENT PLACEMENT;   Surgeon: Hollice Espy, MD;  Location: [facility];  Service: Urology;  Laterality: Left;  . ESOPHAGOGASTRODUODENOSCOPY (EGD) WITH PROPOFOL N/A 12/30/2015   Procedure: ESOPHAGOGASTRODUODENOSCOPY (EGD) WITH PROPOFOL;  Surgeon: Lucilla Lame, MD;  Location: [place];  Service: Endoscopy;  Laterality: N/A;  . ESOPHAGOGASTRODUODENOSCOPY (EGD) WITH PROPOFOL N/A 08/12/2017   Procedure: ESOPHAGOGASTRODUODENOSCOPY (EGD) WITH PROPOFOL;  Surgeon: Lucilla Lame, MD;  Location: [place];  Service: Endoscopy;  Laterality: N/A;  CPAP  . FOOT SURGERY     multiple  . SEPTOPLASTY    . SHOULDER ARTHROSCOPY Bilateral 2008  . TONSILLECTOMY  6  . TUBAL LIGATION    . URETEROSCOPY WITH HOLMIUM LASER LITHOTRIPSY Left 10/26/2016   Procedure: URETEROSCOPY WITH HOLMIUM LASER LITHOTRIPSY;  Surgeon: Hollice Espy, MD;  Location: [facility];  Service: Urology;  Laterality: Left;  Marland Kitchen VAGINAL HYSTERECTOMY  1989   cervical cancer age 83    OB History   None      Home Medications    Prior to Admission medications   Medication Sig Start Date End Date Taking? Authorizing Provider  ARIPiprazole (ABILIFY) 30 MG tablet Take 15 mg by mouth daily. am   Yes [provider]  Cholecalciferol (VITAMIN D3) 2000 units TABS Take 2,000 Units by mouth daily.    Yes [provider]  cyclobenzaprine (FLEXERIL) 10 MG tablet Take 1 tablet (10 mg total) by mouth 3 (three) times daily as needed for muscle spasms. 04/09/17  Yes Norval Gable, MD  esomeprazole (NEXIUM) 40 MG capsule Take 40 mg by mouth daily at 12 noon.   Yes [provider]  Multiple Vitamins-Minerals (MULTIVITAMIN ADULT PO) Take 1 tablet by mouth daily. Centrum silver   Yes [provider]  ondansetron (ZOFRAN-ODT) 8 MG disintegrating tablet Take 1 tablet (8 mg total) by mouth every 8 (eight) hours as needed for nausea or vomiting. 01/19/18  Yes Lucilla Lame, MD  pregabalin (LYRICA) 25 MG capsule Take 25 mg by mouth 2 (two)  times daily.   Yes [provider]  rizatriptan (MAXALT) 10 MG tablet Take 10 mg by mouth as needed for migraine. May repeat in 2 hours if needed   Yes [provider]  Ropinirole HCl (REQUIP XL) 6 MG TB24 Take by mouth daily. bedtime   Yes [provider]  rosuvastatin (CRESTOR) 5 MG tablet Take 5 mg by mouth daily. am   Yes [provider]  traMADol (ULTRAM) 50 MG tablet Take 100 mg by mouth at bedtime.    Yes [provider]  valACYclovir (VALTREX) 500 MG tablet Take 1 tablet (500 mg total) by mouth at bedtime. 03/05/17  Yes Glean Hess, MD  VENTOLIN HFA 108 [phone] Base) MCG/ACT inhaler INHALE 1-2 PUFFS  INTO THE  LUNGS EVERY 6 HOURS AS  NEEDED FOR WHEEZING OR  SHORTNESS OF BREATH. 04/16/17  Yes Glean Hess, MD  HYDROcodone-acetaminophen (NORCO/VICODIN) 5-325 MG tablet Take 1 tablet by mouth every 8 (eight) hours as needed for moderate pain. 03/09/18   Coral Spikes, DO  predniSONE (STERAPRED UNI-PAK 21 TAB) 10 MG (21) TBPK tablet 6 tablets on day 1; decrease by 1 tablet daily until gone. 03/09/18   Coral Spikes, DO    Family History Family History  Problem Relation Age of Onset  . Asthma Mother   . Diabetes Mother   . Breast cancer Mother   . Melanoma Mother   . Heart attack Father 38  . Breast cancer Sister   . Brain cancer Brother   . Kidney cancer Neg Hx   . Bladder Cancer Neg Hx   . Prostate cancer Neg Hx     Social History Social History   Tobacco Use  . Smoking status: Never Smoker  . Smokeless tobacco: Never Used  Substance Use Topics  . Alcohol use: No  . Drug use: No     Allergies   Ciprofloxacin; Morphine; Tapentadol; Compazine [prochlorperazine edisylate]; Haldol [haloperidol]; Amoxicillin; Morphine and related; Oxybutynin; Pineapple; Sulfa antibiotics; Tape; and Other   Review of Systems Review of Systems  Constitutional: Negative.   Musculoskeletal: Positive for back pain.   Physical Exam Triage Vital  Signs ED Triage Vitals  Enc Vitals Group     BP 03/09/18 1044 124/87     Pulse Rate 03/09/18 1044 84     Resp 03/09/18 1044 18     Temp 03/09/18 1044 98.5 F (36.9 C)     Temp Source 03/09/18 1044 Oral     SpO2 03/09/18 1044 98 %     Weight 03/09/18 1045 222 lb (100.7 kg)     Height 03/09/18 1045 5\' 1"  (1.549 m)     Head Circumference --      Peak Flow --      Pain Score 03/09/18 1045 7     Pain Loc --      Pain Edu? --      Excl. in [place]? --    Updated Vital Signs BP 124/87 (BP Location: Right Arm)   Pulse 84   Temp 98.5 F (36.9 C) (Oral)   Resp 18   Ht 5\' 1"  (1.549 m)   Wt 100.7 kg   SpO2 98%   BMI 41.95 kg/m   Visual Acuity Right Eye Distance:   Left Eye Distance:   Bilateral Distance:    Right Eye Near:   Left Eye Near:    Bilateral Near:     Physical Exam  Constitutional: She is oriented to person, place, and time. She appears well-developed. No distress.  Cardiovascular: Normal rate and regular rhythm.  Pulmonary/Chest: Effort normal and breath sounds normal. She has no wheezes. She has no rales.  Musculoskeletal:       Lumbar back: She exhibits tenderness.  Low back with exquisite tenderness to palpation in the midline.  Neurological: She is alert and oriented to person, place, and time.  Psychiatric: She has a normal mood and affect. Her behavior is normal.  Nursing note and vitals reviewed.  UC Treatments / Results  Labs (all labs ordered are listed, but only abnormal results are displayed) Labs Reviewed - No data to display  EKG None  Radiology No results found.  Procedures Procedures (including critical care time)  Medications Ordered in UC  Medications  methylPREDNISolone sodium succinate (SOLU-MEDROL) 40 mg/mL injection 80 mg (80 mg Intramuscular Given 03/09/18 1111)    Initial Impression / Assessment and Plan / UC Course  I have reviewed the triage vital signs and the nursing notes.  Pertinent labs & imaging results that were  available during my care of the patient were reviewed by me and considered in my medical decision making (see chart for details).    57 year old female percents with acute on chronic back pain.  Solu-Medrol given today.  Placing on a brief prednisone taper starting tomorrow.  Vicodin as needed for pain.  [place] controlled substance database was reviewed today.  No concerns for abuse.  Patient to use the Vicodin as needed for severe pain.  She is to not take when she takes tramadol.  Final Clinical Impressions(s) / UC Diagnoses   Final diagnoses:  Chronic midline low back pain with left-sided sciatica     Discharge Instructions     Medication as prescribed.  I hope you feel better soon.  Take care  Dr. Lacinda Axon    ED Prescriptions    Medication Sig Dispense Auth. Provider   HYDROcodone-acetaminophen (NORCO/VICODIN) 5-325 MG tablet Take 1 tablet by mouth every 8 (eight) hours as needed for moderate pain. 10 tablet Beulah Capobianco G, DO   predniSONE (STERAPRED UNI-PAK 21 TAB) 10 MG (21) TBPK tablet 6 tablets on day 1; decrease by 1 tablet daily until gone. 21 tablet Coral Spikes, DO     Controlled Substance Prescriptions [place] Controlled Substance Registry consulted? Yes, I have consulted the [place] Controlled Substances Registry for this patient, and feel the risk/benefit ratio today is favorable for proceeding with this prescription for a controlled substance.   Coral Spikes, DO 03/09/18 1211    Coral Spikes, DO 03/09/18 1457

## 2018-03-09 NOTE — ED Triage Notes (Signed)
Patient c/o low back pain that radiates down her left leg that started this morning. She stated this had happened before and she has had to have an injection before. Her PCP office was not available to see her today.

## 2018-03-09 NOTE — Discharge Instructions (Signed)
Medication as prescribed.  I hope you feel better soon.  Take care  Dr. Lacinda Axon

## 2018-12-14 ENCOUNTER — Other Ambulatory Visit: Payer: Self-pay | Admitting: Physician Assistant

## 2018-12-14 DIAGNOSIS — M25552 Pain in left hip: Secondary | ICD-10-CM

## 2018-12-15 ENCOUNTER — Ambulatory Visit
Admission: RE | Admit: 2018-12-15 | Discharge: 2018-12-15 | Disposition: A | Discharge status: Home / Self Care | Payer: Medicare HMO | Attending: Physician Assistant | Admitting: Physician Assistant

## 2018-12-15 ENCOUNTER — Ambulatory Visit
Admission: RE | Admit: 2018-12-15 | Discharge: 2018-12-15 | Disposition: A | Discharge status: Home / Self Care | Payer: Medicare HMO | Source: Ambulatory Visit | Attending: Physician Assistant | Admitting: Physician Assistant

## 2018-12-15 ENCOUNTER — Other Ambulatory Visit: Payer: Self-pay

## 2018-12-15 DIAGNOSIS — M25552 Pain in left hip: Secondary | ICD-10-CM | POA: Diagnosis present

## 2018-12-29 IMAGING — CT CT PELVIS W/O CM
2 of 3 series · 15 of 46 positions shown, 17 images · non-contrast
Comparison: 09/22/2016

CLINICAL DATA: Suprapubic pain, hematuria for 3 weeks, diagnosed
with a 4 mm distal LEFT ureteral calculus on 09/22/2016

EXAM:
CT PELVIS WITHOUT CONTRAST
TECHNIQUE: Multidetector CT imaging of the pelvis was performed following the
standard protocol without intravenous contrast. Sagittal and coronal
MPR images reconstructed from axial data set. Oral contrast not
administered for this indication.

[Series 2: routine abd/pel wo · axial · 0.80mm/px · z∈[-873,-668]mm · 12 of 49 slices shown, 14 images]
[im 4/49  soft-tissue]
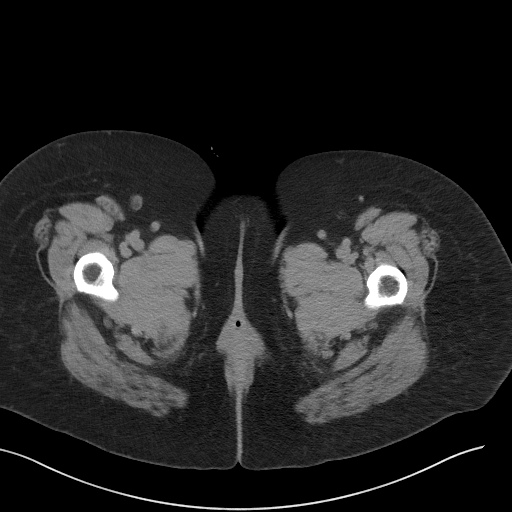
[im 4/49  bone]
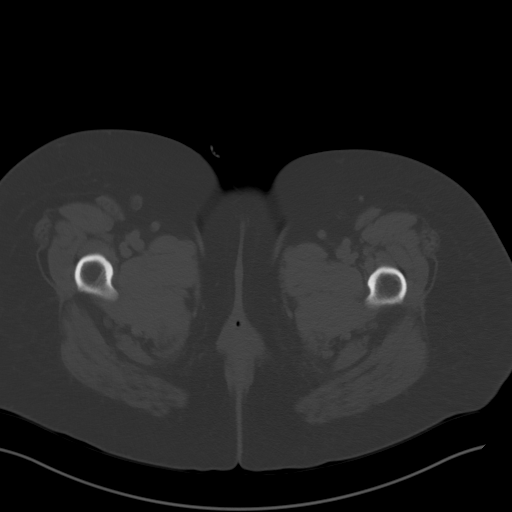
[im 7/49  soft-tissue]
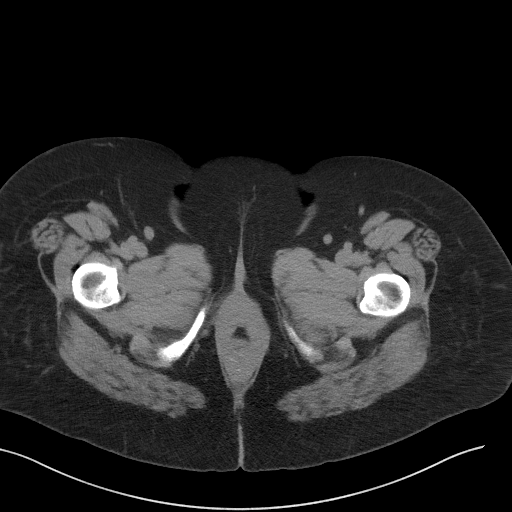
[im 11/49  soft-tissue]
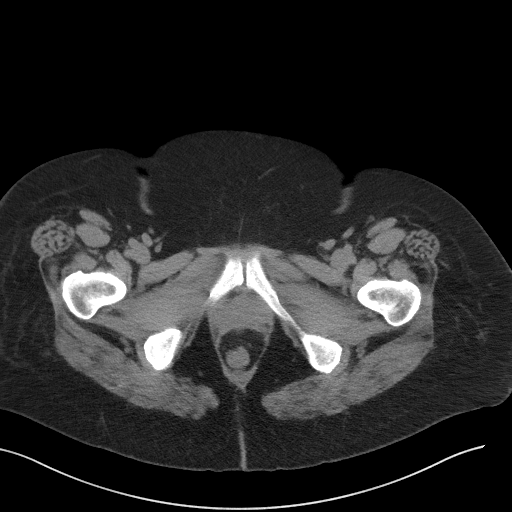
[im 14/49  soft-tissue]
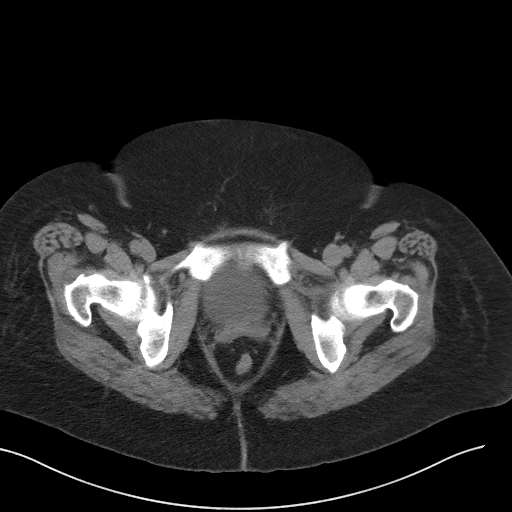
[im 19/49  soft-tissue]
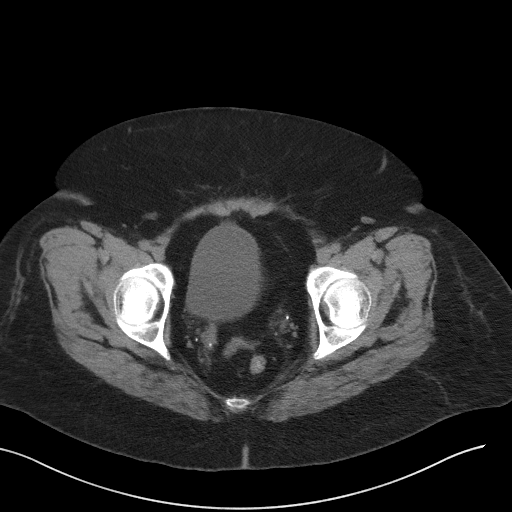
[im 22/49  soft-tissue]
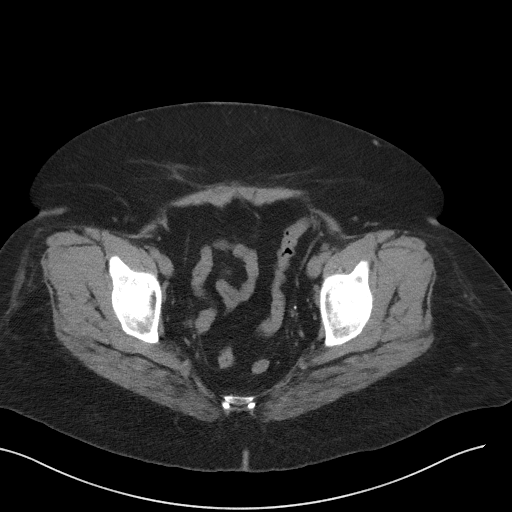
[im 27/49  soft-tissue]
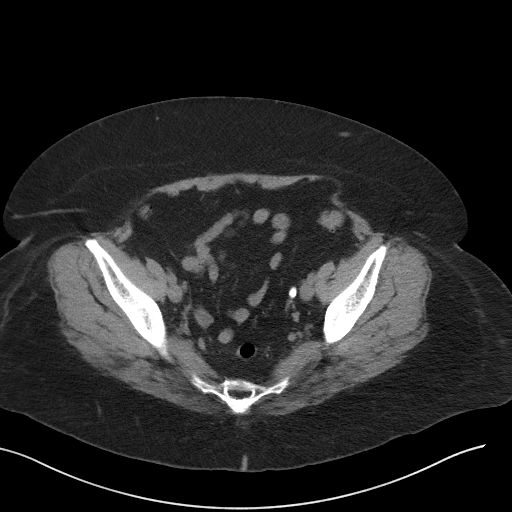
[im 30/49  soft-tissue]
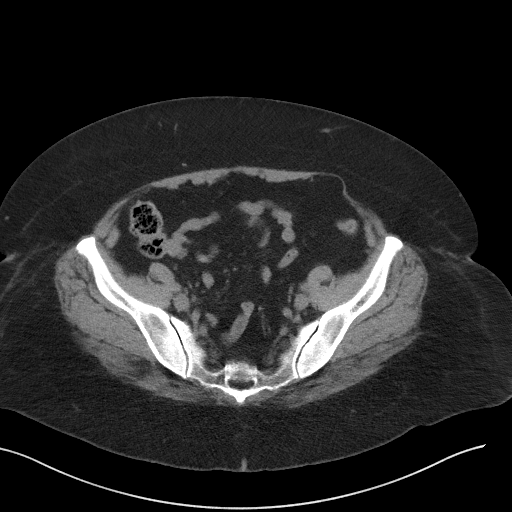
[im 35/49  soft-tissue]
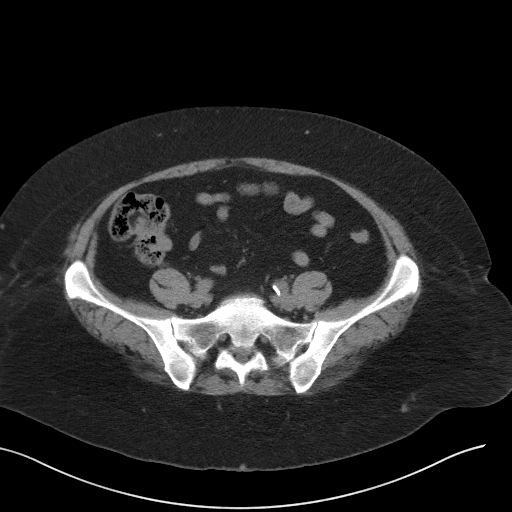
[im 35/49  bone]
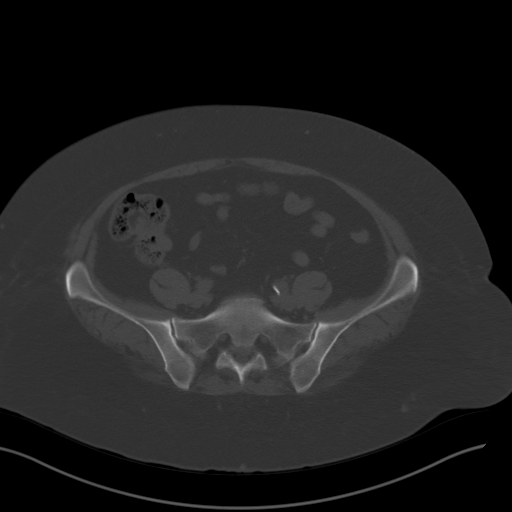
[im 38/49  soft-tissue]
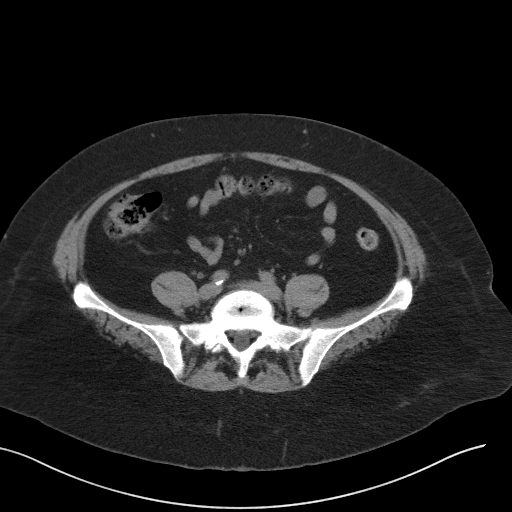
[im 42/49  soft-tissue]
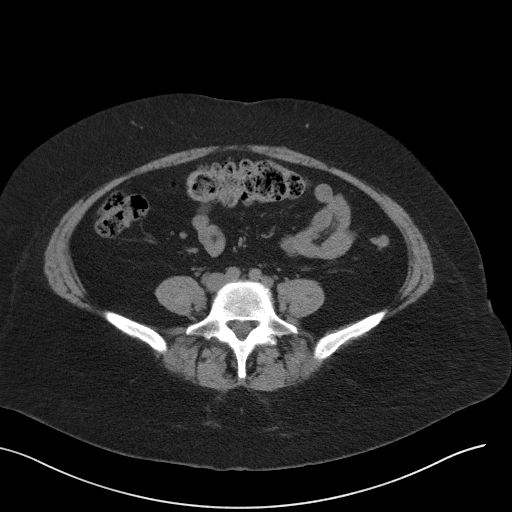
[im 45/49  soft-tissue]
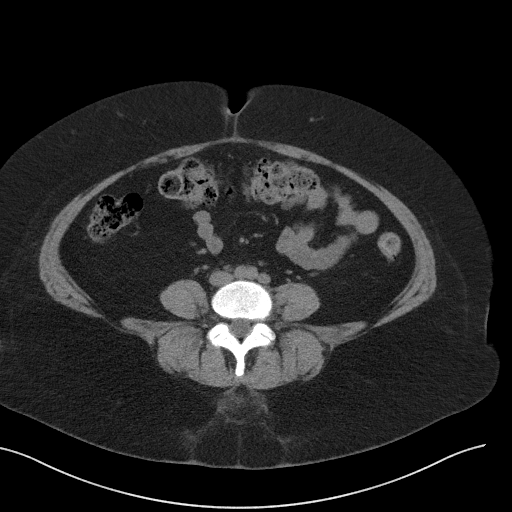

[Series 4: coronal st · coronal · 0.48mm/px · 3 of 101 slices shown]
[im 34/101  soft-tissue]
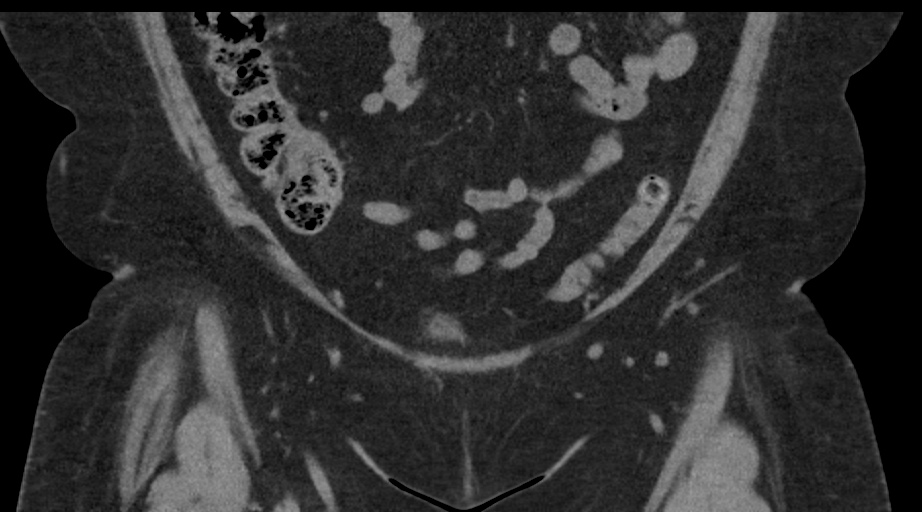
[im 45/101  soft-tissue]
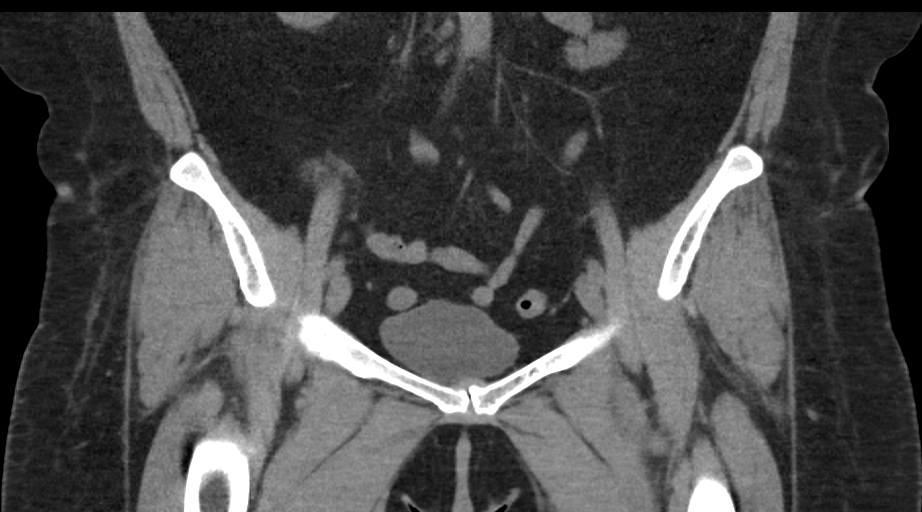
[im 56/101  soft-tissue]
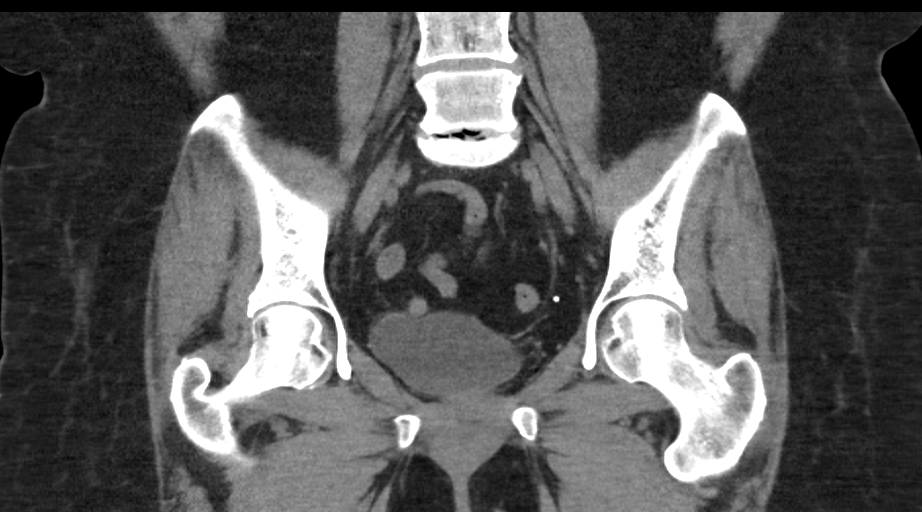

[15 of 46 positions shown; findings below may reference images not displayed]

FINDINGS: Urinary Tract: 4 mm distal LEFT ureteral calculus again identified.
Bladder and ureters otherwise normal appearance.

Bowel: Few distal colonic diverticula noted. Pelvic bowel loops
otherwise unremarkable.

Vascular/Lymphatic: Scattered pelvic phleboliths. Iliac arterial
calcifications.

Reproductive: Uterus surgically absent. Nonvisualization of ovaries.

Other:  No free air free fluid.

Musculoskeletal: Unremarkable
IMPRESSION: Persistent 4 mm distal LEFT ureteral calculus.

Few uncomplicated distal colonic diverticula.

## 2019-04-06 ENCOUNTER — Other Ambulatory Visit: Payer: Self-pay | Admitting: Physician Assistant

## 2019-04-06 ENCOUNTER — Other Ambulatory Visit: Payer: Self-pay

## 2019-04-06 ENCOUNTER — Ambulatory Visit
Admission: RE | Admit: 2019-04-06 | Discharge: 2019-04-06 | Disposition: A | Discharge status: Home / Self Care | Payer: Medicare HMO | Attending: Physician Assistant | Admitting: Physician Assistant

## 2019-04-06 ENCOUNTER — Ambulatory Visit
Admission: RE | Admit: 2019-04-06 | Discharge: 2019-04-06 | Disposition: A | Discharge status: Home / Self Care | Payer: Medicare HMO | Source: Ambulatory Visit | Attending: Physician Assistant | Admitting: Physician Assistant

## 2019-04-06 DIAGNOSIS — M25552 Pain in left hip: Secondary | ICD-10-CM | POA: Diagnosis not present

## 2019-11-11 ENCOUNTER — Other Ambulatory Visit: Payer: Self-pay

## 2019-11-11 ENCOUNTER — Ambulatory Visit
Admission: EM | Admit: 2019-11-11 | Discharge: 2019-11-11 | Disposition: A | Discharge status: Home / Self Care | Payer: Medicare HMO | Attending: Family Medicine | Admitting: Family Medicine

## 2019-11-11 DIAGNOSIS — R301 Vesical tenesmus: Secondary | ICD-10-CM | POA: Insufficient documentation

## 2019-11-11 DIAGNOSIS — R3915 Urgency of urination: Secondary | ICD-10-CM | POA: Insufficient documentation

## 2019-11-11 DIAGNOSIS — R350 Frequency of micturition: Secondary | ICD-10-CM | POA: Diagnosis present

## 2019-11-11 LAB — URINALYSIS, COMPLETE (UACMP) WITH MICROSCOPIC
Bacteria, UA: NONE SEEN
Bilirubin Urine: NEGATIVE
Glucose, UA: NEGATIVE mg/dL
Hgb urine dipstick: NEGATIVE
Ketones, ur: NEGATIVE mg/dL
Leukocytes,Ua: NEGATIVE
Nitrite: NEGATIVE
Protein, ur: NEGATIVE mg/dL
RBC / HPF: NONE SEEN RBC/hpf (ref 0–5)
Specific Gravity, Urine: 1.01 (ref 1.005–1.030)
pH: 7 (ref 5.0–8.0)

## 2019-11-11 MED ORDER — PHENAZOPYRIDINE HCL 200 MG PO TABS: 200.0000 mg | ORAL_TABLET | Freq: Three times a day (TID) | ORAL | 0 refills | Status: DC | PRN

## 2019-11-11 MED ORDER — PHENAZOPYRIDINE HCL 200 MG PO TABS
200.0000 mg | ORAL_TABLET | Freq: Once | ORAL | Status: AC
  Administered 2019-11-11: 09:00:00 200 mg via ORAL

## 2019-11-11 NOTE — ED Provider Notes (Signed)
[place], [place]   Name: Shawnita Hunt DOB: 08/24/1960 MRN: [id number] CSN: [id number] PCP: Dianne Dun, MD  Arrival date and time:  11/11/19 0831  Chief Complaint:  Dysuria  NOTE: Prior to seeing the patient today, I have reviewed the triage nursing documentation and vital signs. Clinical staff has updated patient's PMH/PSHx, current medication list, and drug allergies/intolerances to ensure comprehensive history available to assist in medical decision making.   History:   HPI: Brandi Bell is a 59 y.o. female who presents today with complaints of urinary symptoms that began with acute onset at 0300 this morning. She complains of frequency, urgency, and urinary spasms. She denies actual pain associated with micturition.  She has not appreciated any gross hematuria, nor has she noticed her urine being malodorous. Patient denies any associated nausea, vomiting, fever, or chills. She has not experienced any pain in her lower back or flank area. She describes an intense pressure overlying her bladder (suprapubic area). Patient advises that she does not have a past medical history that is significant for recurrent urinary tract infections. She denies any vaginal pain, bleeding, or discharge. In efforts to conservatively manage her symptoms at home, the patient notes that she has used OTC phenazopyridine, which has not helped to improve her symptoms. Patient in obvious discomfort in clinic. She is observed shifting from side to side if efforts to find a position of comfort.   Past Medical History:  Diagnosis Date  . Anemia    hx of  . Anxiety   . Arthritis    osteo Back, Feet, Hands  . Asthma    [facility] admission - on Bipap - 7/13 - report on paper chart/ flare 3 mos ago  . Bipolar disorder ([place])   . Bipolar disorder ([place])   . Bulging lumbar disc   . Cancer (HCC)    cervical  . Cataract   . Depression   . Elevated LFTs    having ultrasound [facility] - 12/14/14  . GERD (gastroesophageal  reflux disease)    vomiting  . Headache    migraines, every three or 4 days/ stress related  . Heart attack ([place])    hx of/ Dr. Flonnie Overman cardiologist  . 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 History of kidney stones   . History of migraine headaches   . Hypertension    Echo -1/16 - report on paper chart/controlled on meds  . IBS (irritable bowel syndrome)   . Microhematuria   . Motion sickness   . Osteoporosis   . Pneumonia    hx of  . PONV (postoperative nausea and vomiting)   . PTSD (post-traumatic stress disorder)   . Restless leg syndrome   . Rhinitis   . Shortness of breath dyspnea    with exertion  . Sleep apnea    central and obstructive/ CPAP  . Spinal stenosis of cervical region   . Suicide attempt ([place])   . Vertigo 6 yrs ago   hx of    Past Surgical History:  Procedure Laterality Date  . ANTERIOR AND POSTERIOR REPAIR  2002   done with bladder tact  . BILATERAL SALPINGOOPHORECTOMY  2004   pain from scar tissue  . CARDIAC CATHETERIZATION  2008   [facility] - neg - report in paper chart  . CATARACT EXTRACTION Bilateral 2007  . CHOLECYSTECTOMY    . COLONOSCOPY N/A 01/04/2015   Procedure: COLONOSCOPY;  Surgeon: Lucilla Lame, MD;  Location:  [place];  Service: Gastroenterology;  Laterality: N/A;  with biopsies  . CYSTOSCOPY W/ RETROGRADES Right 10/26/2016   Procedure: CYSTOSCOPY WITH RETROGRADE PYELOGRAM;  Surgeon: Hollice Espy, MD;  Location: [facility];  Service: Urology;  Laterality: Right;  . CYSTOSCOPY WITH STENT PLACEMENT Left 10/26/2016   Procedure: CYSTOSCOPY WITH STENT PLACEMENT;  Surgeon: Hollice Espy, MD;  Location: [facility];  Service: Urology;  Laterality: Left;  . ESOPHAGOGASTRODUODENOSCOPY (EGD) WITH PROPOFOL N/A 12/30/2015   Procedure: ESOPHAGOGASTRODUODENOSCOPY (EGD) WITH PROPOFOL;  Surgeon: Lucilla Lame, MD;  Location: [place];  Service: Endoscopy;  Laterality: N/A;  . ESOPHAGOGASTRODUODENOSCOPY (EGD) WITH  PROPOFOL N/A 08/12/2017   Procedure: ESOPHAGOGASTRODUODENOSCOPY (EGD) WITH PROPOFOL;  Surgeon: Lucilla Lame, MD;  Location: [place];  Service: Endoscopy;  Laterality: N/A;  CPAP  . FOOT SURGERY     multiple  . SEPTOPLASTY    . SHOULDER ARTHROSCOPY Bilateral 2008  . TONSILLECTOMY  6  . TUBAL LIGATION    . URETEROSCOPY WITH HOLMIUM LASER LITHOTRIPSY Left 10/26/2016   Procedure: URETEROSCOPY WITH HOLMIUM LASER LITHOTRIPSY;  Surgeon: Hollice Espy, MD;  Location: [facility];  Service: Urology;  Laterality: Left;  Marland Kitchen VAGINAL HYSTERECTOMY  1989   cervical cancer age 27    Family History  Problem Relation Age of Onset  . Asthma Mother   . Diabetes Mother   . Breast cancer Mother   . Melanoma Mother   . Heart attack Father 65  . Breast cancer Sister   . Brain cancer Brother   . Kidney cancer Neg Hx   . Bladder Cancer Neg Hx   . Prostate cancer Neg Hx     Social History   Tobacco Use  . Smoking status: Never Smoker  . Smokeless tobacco: Never Used  Substance Use Topics  . Alcohol use: No  . Drug use: No    Patient Active Problem List   Diagnosis Date Noted  . Nausea and vomiting   . OSA (obstructive sleep apnea) 02/08/2017  . Vitamin D deficiency 07/21/2016  . Hx of suicide attempt 07/19/2016  . PTSD (post-traumatic stress disorder) 07/19/2016  . Migraine headache 07/19/2016  . RLS (restless legs syndrome) 07/01/2016  . Osteoporosis 07/01/2016  . Trochanteric bursitis 03/17/2016  . Prediabetes 03/17/2016  . Gastritis   . Pituitary adenoma ([place]) 10/02/2015  . Borderline personality disorder ([place]) 02/10/2015  . Chronic pain syndrome 02/10/2015  . Arthritis 02/10/2015  . Hx of colonic polyps   . Seasonal allergies 12/21/2013  . Fibromyalgia 12/21/2013  . Moderate persistent asthma without complication [id number]  . Chronic nausea 06/20/2013  . Bipolar affective disorder ([place]) 06/20/2013  . Hx of cervical cancer 07/20/2012  . Lumbar herniated disc 07/20/2012    . Hyperlipidemia 07/20/2012  . 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    Home Medications:    Current Meds  Medication Sig  . ARIPiprazole (ABILIFY) 30 MG tablet Take 15 mg by mouth daily. am  . buPROPion (WELLBUTRIN SR) 200 MG 12 hr tablet Take 200 mg by mouth 3 (three) times daily.  . Cholecalciferol (VITAMIN D3) 2000 units TABS Take 2,000 Units by mouth daily.   . cyclobenzaprine (FLEXERIL) 10 MG tablet Take 1 tablet (10 mg total) by mouth 3 (three) times daily as needed for muscle spasms.  Marland Kitchen diltiazem (CARDIZEM CD) 120 MG 24 hr capsule   . esomeprazole (NEXIUM) 40 MG capsule Take 40 mg by mouth daily at 12 noon.  . metoprolol succinate (TOPROL-XL) 25 MG 24 hr tablet  Take 25 mg by mouth daily.  . modafinil (PROVIGIL) 200 MG tablet Take by mouth.  . ondansetron (ZOFRAN-ODT) 8 MG disintegrating tablet Take 1 tablet (8 mg total) by mouth every 8 (eight) hours as needed for nausea or vomiting.  . pregabalin (LYRICA) 25 MG capsule Take 25 mg by mouth 2 (two) times daily.  . Ropinirole HCl (REQUIP XL) 6 MG TB24 Take by mouth daily. bedtime  . rosuvastatin (CRESTOR) 5 MG tablet Take 5 mg by mouth daily. am  . traMADol (ULTRAM) 50 MG tablet Take 100 mg by mouth at bedtime.   . VENTOLIN HFA 108 (90 Base) MCG/ACT inhaler INHALE 1-2 PUFFS INTO THE  LUNGS EVERY 6 HOURS AS  NEEDED FOR WHEEZING OR  SHORTNESS OF BREATH.    Allergies:   Ciprofloxacin, Morphine, Tapentadol, Compazine [prochlorperazine edisylate], Haldol [haloperidol], Amoxicillin, Morphine and related, Oxybutynin, Pineapple, Sulfa antibiotics, Tape, and Other  Review of Systems (ROS):  Review of systems NEGATIVE unless otherwise noted in narrative H&P section.   Vital Signs: Today's Vitals   11/11/19 0845 11/11/19 0849 11/11/19 0927  BP:  (!) 122/95   Pulse:  62   Resp:  18   Temp:  98.2 F (36.8 C)   TempSrc:  Oral   SpO2:  99%   Weight: 229 lb (103.9 kg)    Height: 5\' 1"  (1.549 m)    PainSc: 3   3      Physical Exam: Physical Exam  Constitutional: She is oriented to person, place, and time. She appears distressed (2/2 significant urinary pain).  HENT:  Head: Normocephalic and atraumatic.  Eyes: Pupils are equal, round, and reactive to light.  Cardiovascular: Normal rate, regular rhythm, normal heart sounds and intact distal pulses.  Pulmonary/Chest: Effort normal and breath sounds normal. Tachypnea noted.  Abdominal: Soft. Normal appearance and bowel sounds are normal. She exhibits no distension. There is abdominal tenderness in the suprapubic area. There is no CVA tenderness.  Neurological: She is alert and oriented to person, place, and time. Gait normal.  Skin: Skin is warm and dry. No rash noted. She is not diaphoretic.  Psychiatric: Memory, affect and judgment normal. Her mood appears anxious.  Nursing note and vitals reviewed.   Urgent Care Treatments / Results:   Orders Placed This Encounter  Procedures  . Urinalysis, Complete w Microscopic    LABS: PLEASE NOTE: all labs that were ordered this encounter are listed, however only abnormal results are displayed. Labs Reviewed  URINALYSIS, COMPLETE (UACMP) WITH MICROSCOPIC    EKG: -None  RADIOLOGY: No results found.  PROCEDURES: Procedures  MEDICATIONS RECEIVED THIS VISIT: Medications  phenazopyridine (PYRIDIUM) tablet 200 mg (200 mg Oral Given 11/11/19 0927)    PERTINENT CLINICAL COURSE NOTES/UPDATES:   Initial Impression / Assessment and Plan / Urgent Care Course:  Pertinent labs & imaging results that were available during my care of the patient were personally reviewed by me and considered in my medical decision making (see lab/imaging section of note for values and interpretations).  Brandi Bell is a 59 y.o. female who presents to [facility] today with complaints of Dysuria  Patient is well appearing overall in clinic today. She appears to be in mild acute distress related to her pain.  Presenting symptoms (see HPI) and exam as documented above. UA today negative for infection; 0-5 WBC/hpf, 0 RBC/hpf, and no nitrites, LE, or bacteria. Symptoms consistent with urinary spasms. Unable to take oxybutynin due to associated side effects (blurred vision).  Treating  with phenazopyridine 200 mg in clinic; Rx for the same sent to patient's pharmacy. Advised to increased oral fluid intake; water is always best to flush the urinary tract. Advised to avoid caffeine containing fluids as caffeine can cause her to experience painful bladder spasms. She was advised that we are in clinic until 1600 today, and 0800-1600 tomorrow, should the prescribed intervention prove to be ineffective.  Discussed follow up with primary care physician in 1 week for re-evaluation. I have reviewed the follow up and strict return precautions for any new or worsening symptoms. Patient is aware of symptoms that would be deemed urgent/emergent, and would thus require further evaluation either here or in the emergency department. At the time of discharge, she verbalized understanding and consent with the discharge plan as it was reviewed with her. All questions were fielded by provider and/or clinic staff prior to patient discharge.    Final Clinical Impressions / Urgent Care Diagnoses:   Final diagnoses:  Painful bladder spasm  Urinary frequency  Urinary urgency    New Prescriptions:  [place] Controlled Substance Registry consulted? Not Applicable  Meds ordered this encounter  Medications  . phenazopyridine (PYRIDIUM) tablet 200 mg  . phenazopyridine (PYRIDIUM) 200 MG tablet    Sig: Take 1 tablet (200 mg total) by mouth 3 (three) times daily as needed for pain.    Dispense:  9 tablet    Refill:  0    Recommended Follow up Care:  Patient encouraged to follow up with the following provider within the specified time frame, or sooner as dictated by the severity of her symptoms. As always, she was instructed that for any  urgent/emergent care needs, she should seek care either here or in the emergency department for more immediate evaluation.  Follow-up Information    Dianne Dun, MD In 1 week.   Specialty: Family Medicine Why: General reassessment of symptoms if not improving Contact information: [address] [place] [address] [phone]         NOTE: This note was prepared using Dragon dictation software along with smaller phrase technology. Despite my best ability to proofread, there is the potential that transcriptional errors may still occur from this process, and are completely unintentional.    Karen Kitchens, NP 11/11/19 1816

## 2019-11-11 NOTE — Discharge Instructions (Signed)
It was very nice seeing you today in clinic. Thank you for entrusting me with your care.   Please utilize the medications that we discussed. Your prescriptions has been called in to your pharmacy.   Make arrangements to follow up with your regular doctor in 1 week for re-evaluation if not improving. If your symptoms/condition worsens, please seek follow up care either here or in the ER. Please remember, our [facility] providers are "right here with you" when you need [place].   Again, it was my pleasure to take care of you today. Thank you for choosing our clinic. I hope that you start to feel better quickly.   Honor Loh, MSN, APRN, FNP-C, CEN Advanced Practice Provider [facility]

## 2019-11-11 NOTE — ED Triage Notes (Signed)
Patient complains of painful urination, urinary urgency and bladder pressure since 3am this morning.

## 2020-01-10 ENCOUNTER — Ambulatory Visit
Admission: EM | Admit: 2020-01-10 | Discharge: 2020-01-10 | Disposition: A | Discharge status: Home / Self Care | Payer: Medicare HMO

## 2020-01-10 ENCOUNTER — Ambulatory Visit (INDEPENDENT_AMBULATORY_CARE_PROVIDER_SITE_OTHER): Payer: Medicare HMO

## 2020-01-10 ENCOUNTER — Other Ambulatory Visit: Payer: Self-pay

## 2020-01-10 DIAGNOSIS — S43402A Unspecified sprain of left shoulder joint, initial encounter: Secondary | ICD-10-CM

## 2020-01-10 DIAGNOSIS — M25532 Pain in left wrist: Secondary | ICD-10-CM

## 2020-01-10 NOTE — Discharge Instructions (Addendum)
Advil as needed. Ice. Stretch. Rest. Pendulum exercises (circles) as discussed.   Follow up with your primary care physician or orthopedic as needed for continued pain.  Return to Urgent care for new or worsening concerns.

## 2020-01-10 NOTE — ED Triage Notes (Signed)
Pt c/o pain to left shoulder and wrist after railing falling from porch while walking down steps. Did not hit head, no LOC. Advil taken at home

## 2020-01-10 NOTE — ED Provider Notes (Signed)
[facility] ____________________________________________  Time seen: Approximately 9:51 AM  I have reviewed the triage vital signs and the nursing notes.   HISTORY  Chief Complaint Shoulder Pain  HPI Brandi Bell is a 59 y.o. female present for evaluation of left shoulder and left wrist pain post injury that occurred this morning.  Patient reports around 630 this morning she was going outside to water her flowers, and the handrail fell causing her to lose her balance and fall forward.  States that she caught herself with her left wrist and injured left wrist and shoulder.  Denies head injury or loss consciousness.  Denies other pain or injuries.  States pain to the left wrist is more of a soreness, but reports more pain to left shoulder.  States able to lift arm but with pain.  Did take Advil prior to arrival, and reports that that did help.  Denies pain radiation, paresthesias, chest pain or other other injury.  Right-hand-dominant.  Tyrone Sage, MD : PCP   Past Medical History:  Diagnosis Date  . Anemia    hx of  . Anxiety   . Arthritis    osteo Back, Feet, Hands  . Asthma    [facility] admission - on Bipap - 7/13 - report on paper chart/ flare 3 mos ago  . Bipolar disorder ([place])   . Bipolar disorder ([place])   . Bulging lumbar disc   . Cancer (HCC)    cervical  . Cataract   . Depression   . Elevated LFTs    having ultrasound [facility] - 12/14/14  . GERD (gastroesophageal reflux disease)    vomiting  . Headache    migraines, every three or 4 days/ stress related  . Heart attack ([place])    hx of/ Dr. Flonnie Overman cardiologist  . 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 History of kidney stones   . History of migraine headaches   . Hypertension    Echo -1/16 - report on paper chart/controlled on meds  . IBS (irritable bowel syndrome)   . Microhematuria   . Motion sickness   . Osteoporosis   . Pneumonia    hx of  . PONV  (postoperative nausea and vomiting)   . PTSD (post-traumatic stress disorder)   . Restless leg syndrome   . Rhinitis   . Shortness of breath dyspnea    with exertion  . Sleep apnea    central and obstructive/ CPAP  . Spinal stenosis of cervical region   . Suicide attempt ([place])   . Vertigo 6 yrs ago   hx of    Patient Active Problem List   Diagnosis Date Noted  . Nausea and vomiting   . OSA (obstructive sleep apnea) 02/08/2017  . Vitamin D deficiency 07/21/2016  . Hx of suicide attempt 07/19/2016  . PTSD (post-traumatic stress disorder) 07/19/2016  . Migraine headache 07/19/2016  . RLS (restless legs syndrome) 07/01/2016  . Osteoporosis 07/01/2016  . Trochanteric bursitis 03/17/2016  . Prediabetes 03/17/2016  . Gastritis   . Pituitary adenoma ([place]) 10/02/2015  . Borderline personality disorder ([place]) 02/10/2015  . Chronic pain syndrome 02/10/2015  . Arthritis 02/10/2015  . Hx of colonic polyps   . Seasonal allergies 12/21/2013  . Fibromyalgia 12/21/2013  . Moderate persistent asthma without complication 87/86/7672  . Chronic nausea 06/20/2013  . Bipolar affective disorder ([place]) 06/20/2013  . Hx of cervical cancer 07/20/2012  . Lumbar herniated disc 07/20/2012  .  Hyperlipidemia 07/20/2012  . 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 ANTERIOR AND POSTERIOR REPAIR  2002   done with bladder tact  . BILATERAL SALPINGOOPHORECTOMY  2004   pain from scar tissue  . CARDIAC CATHETERIZATION  2008   [facility] - neg - report in paper chart  . CATARACT EXTRACTION Bilateral 2007  . CHOLECYSTECTOMY    . COLONOSCOPY N/A 01/04/2015   Procedure: COLONOSCOPY;  Surgeon: Lucilla Lame, MD;  Location: [place];  Service: Gastroenterology;  Laterality: N/A;  with biopsies  . CYSTOSCOPY W/ RETROGRADES Right 10/26/2016   Procedure: CYSTOSCOPY WITH RETROGRADE PYELOGRAM;  Surgeon: Hollice Espy, MD;  Location: [facility];   Service: Urology;  Laterality: Right;  . CYSTOSCOPY WITH STENT PLACEMENT Left 10/26/2016   Procedure: CYSTOSCOPY WITH STENT PLACEMENT;  Surgeon: Hollice Espy, MD;  Location: [facility];  Service: Urology;  Laterality: Left;  . ESOPHAGOGASTRODUODENOSCOPY (EGD) WITH PROPOFOL N/A 12/30/2015   Procedure: ESOPHAGOGASTRODUODENOSCOPY (EGD) WITH PROPOFOL;  Surgeon: Lucilla Lame, MD;  Location: [place];  Service: Endoscopy;  Laterality: N/A;  . ESOPHAGOGASTRODUODENOSCOPY (EGD) WITH PROPOFOL N/A 08/12/2017   Procedure: ESOPHAGOGASTRODUODENOSCOPY (EGD) WITH PROPOFOL;  Surgeon: Lucilla Lame, MD;  Location: [place];  Service: Endoscopy;  Laterality: N/A;  CPAP  . FOOT SURGERY     multiple  . SEPTOPLASTY    . SHOULDER ARTHROSCOPY Bilateral 2008  . TONSILLECTOMY  6  . TUBAL LIGATION    . URETEROSCOPY WITH HOLMIUM LASER LITHOTRIPSY Left 10/26/2016   Procedure: URETEROSCOPY WITH HOLMIUM LASER LITHOTRIPSY;  Surgeon: Hollice Espy, MD;  Location: [facility];  Service: Urology;  Laterality: Left;  Marland Kitchen VAGINAL HYSTERECTOMY  1989   cervical cancer age 16     No current facility-administered medications for this encounter.  Current Outpatient Medications:  .  pramipexole (MIRAPEX) 1 MG tablet, Take 1 mg by mouth in the morning and at bedtime., Disp: , Rfl:  .  ARIPiprazole (ABILIFY) 30 MG tablet, Take 5 mg by mouth daily. am, Disp: , Rfl:  .  buPROPion (WELLBUTRIN SR) 200 MG 12 hr tablet, Take 200 mg by mouth 3 (three) times daily., Disp: , Rfl:  .  Cholecalciferol (VITAMIN D3) 2000 units TABS, Take 2,000 Units by mouth daily. , Disp: , Rfl:  .  cyclobenzaprine (FLEXERIL) 10 MG tablet, Take 1 tablet (10 mg total) by mouth 3 (three) times daily as needed for muscle spasms., Disp: 30 tablet, Rfl: 0 .  diltiazem (CARDIZEM CD) 120 MG 24 hr capsule, , Disp: , Rfl:  .  esomeprazole (NEXIUM) 40 MG capsule, Take 40 mg by mouth daily at 12 noon., Disp: , Rfl:  .  modafinil (PROVIGIL) 200 MG tablet, Take  by mouth., Disp: , Rfl:  .  ondansetron (ZOFRAN-ODT) 8 MG disintegrating tablet, Take 1 tablet (8 mg total) by mouth every 8 (eight) hours as needed for nausea or vomiting., Disp: 90 tablet, Rfl: 3 .  pregabalin (LYRICA) 25 MG capsule, Take 25 mg by mouth 2 (two) times daily., Disp: , Rfl:  .  rosuvastatin (CRESTOR) 5 MG tablet, Take 5 mg by mouth daily. am, Disp: , Rfl:  .  VENTOLIN HFA 108 (90 Base) MCG/ACT inhaler, INHALE 1-2 PUFFS INTO THE  LUNGS EVERY 6 HOURS AS  NEEDED FOR WHEEZING OR  SHORTNESS OF BREATH., Disp: 36 g, Rfl: 5  Allergies Ciprofloxacin, Morphine, Tapentadol, Compazine [prochlorperazine edisylate], Haldol [haloperidol], Amoxicillin, Morphine and related, Oxybutynin, Pineapple, Sulfa antibiotics, Tape,  and Other  Family History  Problem Relation Age of Onset  . Asthma Mother   . Diabetes Mother   . Breast cancer Mother   . Melanoma Mother   . Heart attack Father 74  . Breast cancer Sister   . Brain cancer Brother   . Kidney cancer Neg Hx   . Bladder Cancer Neg Hx   . Prostate cancer Neg Hx     Social History Social History   Tobacco Use  . Smoking status: Never Smoker  . Smokeless tobacco: Never Used  Vaping Use  . Vaping Use: Never used  Substance Use Topics  . Alcohol use: No  . Drug use: No    Review of Systems Constitutional: No fever Cardiovascular: Denies chest pain. Respiratory: Denies shortness of breath. Musculoskeletal: Positive left shoulder left wrist pain. Skin: Negative for rash. Neurological: Negative for focal weakness or numbness.    ____________________________________________   PHYSICAL EXAM:  VITAL SIGNS: ED Triage Vitals  Enc Vitals Group     BP 01/10/20 0859 121/78     Pulse Rate 01/10/20 0859 82     Resp 01/10/20 0859 16     Temp 01/10/20 0859 98 F (36.7 C)     Temp src --      SpO2 01/10/20 0859 100 %     Weight --      Height --      Head Circumference --      Peak Flow --      Pain Score 01/10/20 0900 3      Pain Loc --      Pain Edu? --      Excl. in [place]? --     Constitutional: Alert and oriented. Well appearing and in no acute distress. Eyes: Conjunctivae are normal.  ENT      Head: Normocephalic and atraumatic. Cardiovascular:Good peripheral circulation. Respiratory: Normal respiratory effort without tachypnea nor retractions.  Musculoskeletal: Steady gait.  Bilateral distal radial pulses equal.  Hand grip strong. Except: Left distal wrist mild diffuse tenderness palpation, no point tenderness, able to rotate well. Except: Left shoulder anterior aspect AC joint and proximal humerus tenderness direct palpation pain with abduction but able to abduct fully, positive empty can test, negative drop arm test, left upper extremity otherwise nontender. Neurologic:  Normal speech and language. Speech is normal. No gait instability.  Skin:  Skin is warm, dry and intact. No rash noted. Psychiatric: Mood and affect are normal. Speech and behavior are normal. Patient exhibits appropriate insight and judgment   ___________________________________________   LABS (all labs ordered are listed, but only abnormal results are displayed)  Labs Reviewed - No data to display  RADIOLOGY  DG Wrist Complete Left  Result Date: 01/10/2020 CLINICAL DATA:  Left wrist pain after fall. EXAM: LEFT WRIST - COMPLETE 3+ VIEW COMPARISON:  None. FINDINGS: There is no evidence of fracture or dislocation. There is no evidence of arthropathy or other focal bone abnormality. Soft tissues are unremarkable. IMPRESSION: Negative. Electronically Signed   By: Marijo Conception M.D.   On: 01/10/2020 10:08   DG Shoulder Left  Result Date: 01/10/2020 CLINICAL DATA:  Left shoulder pain after fall. EXAM: LEFT SHOULDER - 2+ VIEW COMPARISON:  None. FINDINGS: There is no evidence of fracture or dislocation. There is no evidence of arthropathy or other focal bone abnormality. Soft tissues are unremarkable. IMPRESSION: Negative. Electronically  Signed   By: Marijo Conception M.D.   On: 01/10/2020 10:07   ____________________________________________  PROCEDURES Procedures    INITIAL IMPRESSION / ASSESSMENT AND PLAN / ED COURSE  Pertinent labs & imaging results that were available during my care of the patient were reviewed by me and considered in my medical decision making (see chart for details).  Well-appearing patient.  No acute distress.  Left wrist and left shoulder x-rays as above radiologist, reviewed, negative.  Suspect contusion and sprain injuries.  Encouraged ice, rest, pendulum exercises.  Patient states that she will continue NSAIDs at home as needed.  Follow-up with primary care or orthopedic as needed for continued pain.  Discussed follow up and return parameters including no resolution or any worsening concerns. Patient verbalized understanding and agreed to plan.   ____________________________________________   FINAL CLINICAL IMPRESSION(S) / ED DIAGNOSES  Final diagnoses:  Sprain of left shoulder, unspecified shoulder sprain type, initial encounter  Left wrist pain     ED Discharge Orders    None       Note: This dictation was prepared with Dragon dictation along with smaller phrase technology. Any transcriptional errors that result from this process are unintentional.         Marylene Land, NP 01/10/20 1043

## 2023-09-22 ENCOUNTER — Ambulatory Visit: Admission: EM | Admit: 2023-09-22 | Discharge: 2023-09-22 | Disposition: A | Discharge status: Home / Self Care

## 2023-09-22 DIAGNOSIS — R519 Headache, unspecified: Secondary | ICD-10-CM | POA: Diagnosis not present

## 2023-09-22 MED ORDER — KETOROLAC TROMETHAMINE 60 MG/2ML IM SOLN
30.0000 mg | Freq: Once | INTRAMUSCULAR | Status: AC
  Administered 2023-09-22: 30 mg via INTRAMUSCULAR

## 2023-09-22 NOTE — Discharge Instructions (Signed)
-  You were given an injection of ketorolac in clinic for your headache -Continue home meds -Rest and increase fluids -Try your best to avoid triggers -Keep follow up appointments with PCP and neurologist -Go to ER if this becomes the worst headache you have ever had, you  have vision changes, confusion, difficulty walking or speaking, numbness/tingling or weakness.

## 2023-09-22 NOTE — ED Triage Notes (Signed)
 Headache x 1 day. Pt states she is going through at a lot of stress right now and has not been eating right now. Pt just moved back here and her the PCP can't see her until next week.   Took tylenol and Advil with no relief of symptoms.

## 2023-09-22 NOTE — ED Provider Notes (Signed)
 [facility]    CSN: [id number] Arrival date & time: 09/22/23  1713      History   Chief Complaint Chief Complaint  Patient presents with   Headache    HPI Brandi Bell is a 63 y.o. female with a history of anxiety, arthritis, asthma, bipolar disorder, cervical cancer, previous MI, tension headaches and migraines, PTSD, restless leg syndrome, IBS, hypertension and osteoporosis.  Patient presents today for evaluation of headache that began yesterday.  States the headache is on both sides of her head and wraps around.  It is associated with photophobia.  History of tension type headaches and migraines.  States this is consistent with previous headache she has had.  Used to be on a triptan but was recently taken off due to her MI.  Has tried ibuprofen and Tylenol at home without relief.  States she has received Toradol in the past and it has been helpful for her headaches.  Requesting injection of Toradol today.  Denies trauma or injury.  Denies dizziness, flashes of light/floaters, phonophobia, nausea/vomiting, confusion, vision changes, numbness/tingling or weakness.  HPI  Past Medical History:  Diagnosis Date   Anemia    hx of   Anxiety    Arthritis    osteo Back, Feet, Hands   Asthma    [facility] admission - on Bipap - 7/13 - report on paper chart/ flare 3 mos ago   Bipolar disorder (HCC)    Bipolar disorder (HCC)    Bulging lumbar disc    Cancer (HCC)    cervical   Cataract    Depression    Elevated LFTs    having ultrasound [facility] - 12/14/14   GERD (gastroesophageal reflux disease)    vomiting   Headache    migraines, every three or 4 days/ stress related   Heart attack (HCC)    hx of/ Dr. 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, self repaired   History of kidney stones    History of migraine headaches    Hypertension    Echo -1/16 - report on paper chart/controlled on meds   IBS (irritable bowel syndrome)     Microhematuria    Motion sickness    Osteoporosis    Pneumonia    hx of   PONV (postoperative nausea and vomiting)    PTSD (post-traumatic stress disorder)    Restless leg syndrome    Rhinitis    Shortness of breath dyspnea    with exertion   Sleep apnea    central and obstructive/ CPAP   Spinal stenosis of cervical region    Suicide attempt [facility])    Vertigo 6 yrs ago   hx of    Patient Active Problem List   Diagnosis Date Noted   Nausea and vomiting    OSA (obstructive sleep apnea) 02/08/2017   Vitamin D deficiency 07/21/2016   Hx of suicide attempt 07/19/2016   PTSD (post-traumatic stress disorder) 07/19/2016   Migraine headache 07/19/2016   RLS (restless legs syndrome) 07/01/2016   Osteoporosis 07/01/2016   Trochanteric bursitis 03/17/2016   Prediabetes 03/17/2016   Gastritis    Pituitary adenoma (HCC) 10/02/2015   Borderline personality disorder (HCC) 02/10/2015   Chronic pain syndrome 02/10/2015   Arthritis 02/10/2015   Hx of colonic polyps    Seasonal allergies 12/21/2013   Fibromyalgia 12/21/2013   Moderate persistent asthma without complication 06/20/2013   Chronic nausea 06/20/2013   Bipolar affective disorder (  HCC) 06/20/2013   Hx of cervical cancer 07/20/2012   Lumbar herniated disc 07/20/2012   Hyperlipidemia 07/20/2012   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    Past Surgical History:  Procedure Laterality Date   ANTERIOR AND POSTERIOR REPAIR  2002   done with bladder tact   BILATERAL SALPINGOOPHORECTOMY  2004   pain from scar tissue   CARDIAC CATHETERIZATION  2008   [facility] - neg - report in paper chart   CATARACT EXTRACTION Bilateral 2007   CHOLECYSTECTOMY     COLONOSCOPY N/A 01/04/2015   Procedure: COLONOSCOPY;  Surgeon: Midge Minium, MD;  Location: [facility];  Service: Gastroenterology;  Laterality: N/A;  with biopsies   CYSTOSCOPY W/ RETROGRADES Right 10/26/2016   Procedure: CYSTOSCOPY WITH RETROGRADE PYELOGRAM;  Surgeon:  Vanna Scotland, MD;  Location: [facility];  Service: Urology;  Laterality: Right;   CYSTOSCOPY WITH STENT PLACEMENT Left 10/26/2016   Procedure: CYSTOSCOPY WITH STENT PLACEMENT;  Surgeon: Vanna Scotland, MD;  Location: [facility];  Service: Urology;  Laterality: Left;   ESOPHAGOGASTRODUODENOSCOPY (EGD) WITH PROPOFOL N/A 12/30/2015   Procedure: ESOPHAGOGASTRODUODENOSCOPY (EGD) WITH PROPOFOL;  Surgeon: Midge Minium, MD;  Location: [facility];  Service: Endoscopy;  Laterality: N/A;   ESOPHAGOGASTRODUODENOSCOPY (EGD) WITH PROPOFOL N/A 08/12/2017   Procedure: ESOPHAGOGASTRODUODENOSCOPY (EGD) WITH PROPOFOL;  Surgeon: Midge Minium, MD;  Location: [facility];  Service: Endoscopy;  Laterality: N/A;  CPAP   FOOT SURGERY     multiple   SEPTOPLASTY     SHOULDER ARTHROSCOPY Bilateral 2008   TONSILLECTOMY  6   TUBAL LIGATION     URETEROSCOPY WITH HOLMIUM LASER LITHOTRIPSY Left 10/26/2016   Procedure: URETEROSCOPY WITH HOLMIUM LASER LITHOTRIPSY;  Surgeon: Vanna Scotland, MD;  Location: [facility];  Service: Urology;  Laterality: Left;   VAGINAL HYSTERECTOMY  1989   cervical cancer age 89    OB History   No obstetric history on file.      Home Medications    Prior to Admission medications   Medication Sig Start Date End Date Taking? Authorizing Provider  amLODipine (NORVASC) 2.5 MG tablet Take 1 tablet by mouth daily. 03/24/23 03/23/24 Yes [provider]  atenolol (TENORMIN) 25 MG tablet  04/07/23  Yes [provider]  buPROPion (WELLBUTRIN SR) 200 MG 12 hr tablet Take 200 mg by mouth 3 (three) times daily. 09/23/19  Yes [provider]  Cholecalciferol (VITAMIN D3) 2000 units TABS Take 2,000 Units by mouth daily.    Yes [provider]  clindamycin (CLEOCIN) 150 MG capsule TAKE 2 CAPSULES BY MOUTH NOW THEN TAKE ONE CAPSULE 4 TIMES A DAY UNTIL FINISHED   Yes [provider]  CYMBALTA 20 MG capsule Take 1 capsule every day by oral route in the evening.  07/10/23  Yes [provider]  esomeprazole (NEXIUM) 40 MG capsule Take 40 mg by mouth daily at 12 noon.   Yes [provider]  ezetimibe (ZETIA) 10 MG tablet Take 1 tablet by mouth daily. 08/04/23 09/19/24 Yes [provider]  meloxicam (MOBIC) 7.5 MG tablet Take 1 tablet by mouth daily. 06/01/23  Yes [provider]  mometasone-formoterol (DULERA) 200-5 MCG/ACT AERO Inhale into the lungs. 09/05/23  Yes [provider]  predniSONE (DELTASONE) 5 MG tablet Take 5 mg by mouth daily with breakfast.   Yes [provider]  pregabalin (LYRICA) 25 MG capsule Take 25 mg by mouth 2 (two) times daily.   Yes [provider]  rOPINIRole (REQUIP) 2 MG  tablet    Yes [provider]  ARIPiprazole (ABILIFY) 30 MG tablet Take 5 mg by mouth daily. am    [provider]  cyclobenzaprine (FLEXERIL) 10 MG tablet Take 1 tablet (10 mg total) by mouth 3 (three) times daily as needed for muscle spasms. 04/09/17   Payton Mccallum, MD  diltiazem (CARDIZEM CD) 120 MG 24 hr capsule  09/27/19   [provider]  modafinil (PROVIGIL) 200 MG tablet Take by mouth.    [provider]  ondansetron (ZOFRAN-ODT) 8 MG disintegrating tablet Take 1 tablet (8 mg total) by mouth every 8 (eight) hours as needed for nausea or vomiting. 01/19/18   Midge Minium, MD  pramipexole (MIRAPEX) 1 MG tablet Take 1 mg by mouth in the morning and at bedtime.    [provider]  rosuvastatin (CRESTOR) 5 MG tablet Take 5 mg by mouth daily. am    [provider]  VENTOLIN HFA 108 (90 Base) MCG/ACT inhaler INHALE 1-2 PUFFS INTO THE  LUNGS EVERY 6 HOURS AS  NEEDED FOR WHEEZING OR  SHORTNESS OF BREATH. 04/16/17   Reubin Milan, MD  rizatriptan (MAXALT) 10 MG tablet Take 10 mg by mouth as needed for migraine. May repeat in 2 hours if needed  11/11/19  [provider]    Family History Family History  Problem Relation Age of Onset   Asthma  Mother    Diabetes Mother    Breast cancer Mother    Melanoma Mother    Heart attack Father 56   Breast cancer Sister    Brain cancer Brother    Kidney cancer Neg Hx    Bladder Cancer Neg Hx    Prostate cancer Neg Hx     Social History Social History   Tobacco Use   Smoking status: Never   Smokeless tobacco: Never  Vaping Use   Vaping status: Never Used  Substance Use Topics   Alcohol use: No   Drug use: No     Allergies   Ciprofloxacin, Morphine, Tapentadol, Compazine [prochlorperazine edisylate], Haldol [haloperidol], Amoxicillin, Morphine and codeine, Oxybutynin, Pineapple, Sulfa antibiotics, Tape, and Other   Review of Systems Review of Systems  Constitutional:  Negative for fatigue.  Eyes:  Positive for photophobia. Negative for visual disturbance.  Respiratory:  Negative for shortness of breath.   Cardiovascular:  Negative for chest pain.  Gastrointestinal:  Negative for nausea and vomiting.  Musculoskeletal:  Negative for gait problem.  Neurological:  Positive for headaches. Negative for dizziness, tremors, seizures, syncope, facial asymmetry, speech difficulty, weakness, light-headedness and numbness.  Psychiatric/Behavioral:  Negative for confusion.      Physical Exam Triage Vital Signs ED Triage Vitals  Encounter Vitals Group     BP      Systolic BP Percentile      Diastolic BP Percentile      Pulse      Resp      Temp      Temp src      SpO2      Weight      Height      Head Circumference      Peak Flow      Pain Score      Pain Loc      Pain Education      Exclude from Growth Chart    No data found.  Updated Vital Signs BP 122/83 (BP Location: Left Arm)   Pulse 97   Temp 98.5 F (36.9 C) (  Oral)   Resp 18   SpO2 95%       Physical Exam Vitals and nursing note reviewed.  Constitutional:      General: She is not in acute distress.    Appearance: Normal appearance. She is not ill-appearing or toxic-appearing.  HENT:     Head:  Normocephalic and atraumatic.     Nose: Nose normal.     Mouth/Throat:     Mouth: Mucous membranes are moist.     Pharynx: Oropharynx is clear.  Eyes:     General: No scleral icterus.       Right eye: No discharge.        Left eye: No discharge.     Extraocular Movements: Extraocular movements intact.     Conjunctiva/sclera: Conjunctivae normal.     Pupils: Pupils are equal, round, and reactive to light.  Cardiovascular:     Rate and Rhythm: Normal rate and regular rhythm.     Heart sounds: Normal heart sounds.  Pulmonary:     Effort: Pulmonary effort is normal. No respiratory distress.     Breath sounds: Normal breath sounds.  Musculoskeletal:     Cervical back: Neck supple.  Skin:    General: Skin is dry.  Neurological:     General: No focal deficit present.     Mental Status: She is alert and oriented to person, place, and time. Mental status is at baseline.     Cranial Nerves: No cranial nerve deficit.     Motor: No weakness.     Coordination: Coordination normal.     Gait: Gait normal.     Comments: 5 out of 5 strength bilateral upper and lower extremities.  Psychiatric:        Mood and Affect: Mood normal.        Behavior: Behavior normal.      UC Treatments / Results  Labs (all labs ordered are listed, but only abnormal results are displayed) Labs Reviewed - No data to display  EKG   Radiology No results found.  Procedures Procedures (including critical care time)  Medications Ordered in UC Medications  ketorolac (TORADOL) injection 30 mg (30 mg Intramuscular Given 09/22/23 1808)    Initial Impression / Assessment and Plan / UC Course  I have reviewed the triage vital signs and the nursing notes.  Pertinent labs & imaging results that were available during my care of the patient were reviewed by me and considered in my medical decision making (see chart for details).   63 year old female presents for headache since yesterday.  History of migraines  and tension type headaches.  Used to take a triptan for rescue but has been advised by cardiologist not to take it due to history of MI.  Has tried ibuprofen and Tylenol without relief.  She reports being under extreme stress recently.  Was hospitalized for asthma exacerbation last month.  Reports that she lost her partner, dog in house in the past month as well.  Vitals are stable and normal.  She is overall well-appearing.  No acute distress.  On exam has normal neurological findings.  No red flag signs or symptoms.  Symptoms are more consistent with tension type headache at this time.  Patient was given 30 mg IM ketorolac in clinic for acute headache relief.  Advised to continue home medications, increase rest and fluids and avoid triggers to the best of her ability.  Thoroughly reviewed return to ER precautions.   Final Clinical Impressions(s) / UC  Diagnoses   Final diagnoses:  Acute nonintractable headache, unspecified headache type     Discharge Instructions      -You were given an injection of ketorolac in clinic for your headache -Continue home meds -Rest and increase fluids -Try your best to avoid triggers -Keep follow up appointments with PCP and neurologist -Go to ER if this becomes the worst headache you have ever had, you  have vision changes, confusion, difficulty walking or speaking, numbness/tingling or weakness.     ED Prescriptions   None    PDMP not reviewed this encounter.   Shirlee Latch, PA-C 09/22/23 1811

## 2023-10-09 ENCOUNTER — Ambulatory Visit
Admission: RE | Admit: 2023-10-09 | Discharge: 2023-10-09 | Disposition: A | Discharge status: Home / Self Care | Source: Ambulatory Visit | Attending: Physician Assistant | Admitting: Physician Assistant

## 2023-10-09 ENCOUNTER — Other Ambulatory Visit: Payer: Self-pay | Admitting: Physician Assistant

## 2023-10-09 DIAGNOSIS — R609 Edema, unspecified: Secondary | ICD-10-CM

## 2023-12-15 ENCOUNTER — Ambulatory Visit: Attending: Physical Medicine & Rehabilitation | Admitting: Physical Therapy

## 2023-12-15 ENCOUNTER — Encounter: Payer: Self-pay | Admitting: Physical Therapy

## 2023-12-15 DIAGNOSIS — R262 Difficulty in walking, not elsewhere classified: Secondary | ICD-10-CM | POA: Diagnosis present

## 2023-12-15 DIAGNOSIS — R296 Repeated falls: Secondary | ICD-10-CM | POA: Diagnosis present

## 2023-12-15 DIAGNOSIS — M6281 Muscle weakness (generalized): Secondary | ICD-10-CM | POA: Insufficient documentation

## 2023-12-15 DIAGNOSIS — M5416 Radiculopathy, lumbar region: Secondary | ICD-10-CM | POA: Diagnosis present

## 2023-12-15 NOTE — Therapy (Signed)
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 get back to dancing and swimming. Baseline: as low as 3/5 in hip flexion,  testing to be completed as able at future visits (12/15/23);  Goal status: INITIAL   PLAN:  PT FREQUENCY: 1-2x/week  PT DURATION: 8-12 weeks  PLANNED INTERVENTIONS: 97164- PT Re-evaluation, 97750- Physical Performance Testing, 97110-Therapeutic exercises, 97530- Therapeutic activity, [id number]- Neuromuscular re-education, 97535- Self Care, 32440- Manual therapy, [phone]- Gait training, [phone]- Aquatic Therapy, [phone]- Electrical stimulation (unattended), [phone] (1-2 muscles), [phone] (3+ muscles)- Dry Needling, Patient/Family education, Balance training, Stair training, Taping, Joint mobilization, Spinal  mobilization, DME instructions, Cryotherapy, and Moist heat.  PLAN FOR NEXT SESSION: Assess response to HEP, continue MDT assessment as needed and update HEP as appropriate. Reduce derangement, maintain reduction. Education.   PRINCIPLES OF MANAGEMENT Education: seated  posture with lumbar roll, avoidance of flexion, HEP Exercise Type: REIL  Frequency: 15 reps ever 2-3 hours while awake  Other Exercises / Interventions: avoid flexion, utilized prone on elbows for 3 minutes if REIL is too uncomfortable at first.  Management Goals: reduction of derangement, maintenance of reduction, recovery of function, prophylaxis   Carilyn Charles. Artemio Larry, PT, DPT 12/15/23, 8:19 PM  [facility] [address] [place], [place] [address] P: [phone] I F: [phone]
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
## 2023-12-21 ENCOUNTER — Telehealth: Payer: Self-pay | Admitting: Physical Therapy

## 2023-12-21 ENCOUNTER — Ambulatory Visit: Admitting: Physical Therapy

## 2023-12-21 NOTE — Telephone Encounter (Signed)
 Spoke with patient notifying patient of missed PT visit scheduled at 5:30pm today. She said she has been to the hospital twice since last PT session (eval) because her liver is making her really sick and she is unable to continue with PT right now. We agreed she would be taking off the schedule for now, and she can call back to re-schedule when she is able to resume PT.   Updated support staff.   Carilyn Charles. Artemio Larry, PT, DPT 12/21/23, 6:04 PM  [facility] [address] [place], [place] [address] P: [phone] I F: [phone]

## 2023-12-28 ENCOUNTER — Ambulatory Visit: Admitting: Physical Therapy

## 2023-12-30 ENCOUNTER — Ambulatory Visit: Admitting: Physical Therapy

## 2024-01-04 ENCOUNTER — Encounter: Admitting: Physical Therapy

## 2024-01-06 ENCOUNTER — Encounter: Admitting: Physical Therapy

## 2024-01-11 ENCOUNTER — Encounter: Admitting: Physical Therapy

## 2024-01-13 ENCOUNTER — Encounter: Admitting: Physical Therapy

## 2024-01-18 ENCOUNTER — Encounter: Admitting: Physical Therapy

## 2024-01-19 ENCOUNTER — Encounter: Admitting: Physical Therapy

## 2024-01-24 ENCOUNTER — Encounter: Admitting: Physical Therapy

## 2024-01-26 ENCOUNTER — Encounter: Admitting: Physical Therapy

## 2024-01-31 ENCOUNTER — Encounter: Admitting: Physical Therapy

## 2024-02-02 ENCOUNTER — Encounter: Admitting: Physical Therapy

## 2024-02-22 LAB — GLUCOSE, POCT (MANUAL RESULT ENTRY): POC Glucose: 115 mg/dL — AB (ref 70–99)

## 2024-02-22 NOTE — Congregational Nurse Program (Addendum)
  Dept: [phone]   Congregational Nurse Program Note  Date of Encounter: 02/22/2024  Provided handout for Know Your numbers. Provided blood pressure machine and daily logs for patient to continue to monitor blood pressures.  Past Medical History: Past Medical History:  Diagnosis Date   Anemia    hx of   Anxiety    Arthritis    osteo Back, Feet, Hands   Asthma    [facility] admission - on Bipap - 7/13 - report on paper chart/ flare 3 mos ago   Bipolar disorder (HCC)    Bipolar disorder (HCC)    Bulging lumbar disc    Cancer (HCC)    cervical   Cataract    Depression    Elevated LFTs    having ultrasound [facility] - 12/14/14   GERD (gastroesophageal reflux disease)    vomiting   Headache    migraines, every three or 4 days/ stress related   Heart attack (HCC)    hx of/ Dr. 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, self repaired   History of kidney stones    History of migraine headaches    Hypertension    Echo -1/16 - report on paper chart/controlled on meds   IBS (irritable bowel syndrome)    Microhematuria    Motion sickness    Osteoporosis    Pneumonia    hx of   PONV (postoperative nausea and vomiting)    PTSD (post-traumatic stress disorder)    Restless leg syndrome    Rhinitis    Shortness of breath dyspnea    with exertion   Sleep apnea    central and obstructive/ CPAP   Spinal stenosis of cervical region    Suicide attempt [facility])    Vertigo 6 yrs ago   hx of    Encounter Details:  Community Questionnaire - 02/22/24 1344       Questionnaire   Ask client: Do you give verbal consent for me to treat you today? Yes    Student Assistance N/A    Location Patient Served  [facility]    Encounter Setting CN site    Population Status Unknown    Insurance Medicare    Insurance/Financial Assistance Referral N/A    Medication N/A    Medical Provider Yes    Screening Referrals Made N/A    Medical Referrals Made N/A    Medical  Appointment Completed N/A    CNP Interventions Advocate/Support    Screenings CN Performed Blood Glucose    ED Visit Averted N/A    Life-Saving Intervention Made N/A          Today's Vitals   02/22/24 1342  BP: (!) 132/92   There is no height or weight on file to calculate BMI.

## 2024-03-10 ENCOUNTER — Ambulatory Visit: Admission: EM | Admit: 2024-03-10 | Discharge: 2024-03-10 | Disposition: A | Discharge status: Home / Self Care

## 2024-03-10 ENCOUNTER — Encounter: Payer: Self-pay | Admitting: Emergency Medicine

## 2024-03-10 DIAGNOSIS — S81811A Laceration without foreign body, right lower leg, initial encounter: Secondary | ICD-10-CM

## 2024-03-10 MED ORDER — DOXYCYCLINE HYCLATE 100 MG PO CAPS: 100.0000 mg | ORAL_CAPSULE | Freq: Two times a day (BID) | ORAL | 0 refills | Status: AC

## 2024-03-10 NOTE — Discharge Instructions (Addendum)
Leave the dressing in place for the next 24 hours.  After 24 hours remove the dressing, wash the wound with warm water and soap, pat it dry, and apply a thin smear of bacitracin.  Clean the wound daily and apply bacitracin for the first 2 days.  After that a scab should have started to form and you can leave the wound open to air when you are at home and cover with a Band-Aid when you go out in public.  Take the doxycycline twice daily for 5 days for prevention of wound infection.  Sutures remain in place for 10 days, please return here or see your primary care provider for removal.  If you develop any redness at the wound site, swelling, pain, drainage, red streaks going up your hand, or fever please return for reevaluation.  

## 2024-03-10 NOTE — ED Provider Notes (Signed)
 [facility]    CSN: [id number] Arrival date & time: 03/10/24  0806      History   Chief Complaint Chief Complaint  Patient presents with   Leg Pain    Right ankle and lower leg    HPI Brandi Bell is a 63 y.o. female.   HPI  63 year old female with past medical history significant for anemia, anxiety, osteoarthritis, asthma, bipolar, GERD, migraine headaches, and previous MI presents for evaluation of a laceration sustained to her right lower leg.  She reports that she was cleaning the bathroom around 430 this morning and hit her right ankle on a shelf or piece of glass and sustained a laceration.  She is unsure what exactly cut her.  Review of immunizations in epic indicate that the patient's last Tdap was in 2024.  The patient reports that she did apply superglue because she did not want to go to the ER this morning.  She is endorsing numbness in her 3rd through 5th toe.  Past Medical History:  Diagnosis Date   Anemia    hx of   Anxiety    Arthritis    osteo Back, Feet, Hands   Asthma    [facility] admission - on Bipap - 7/13 - report on paper chart/ flare 3 mos ago   Bipolar disorder (HCC)    Bipolar disorder (HCC)    Bulging lumbar disc    Cancer (HCC)    cervical   Cataract    Depression    Elevated LFTs    having ultrasound [facility] - 12/14/14   GERD (gastroesophageal reflux disease)    vomiting   Headache    migraines, every three or 4 days/ stress related   Heart attack (HCC)    hx of/ Dr. 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, self repaired   History of kidney stones    History of migraine headaches    Hypertension    Echo -1/16 - report on paper chart/controlled on meds   IBS (irritable bowel syndrome)    Microhematuria    Motion sickness    Osteoporosis    Pneumonia    hx of   PONV (postoperative nausea and vomiting)    PTSD (post-traumatic stress disorder)    Restless leg syndrome    Rhinitis    Shortness  of breath dyspnea    with exertion   Sleep apnea    central and obstructive/ CPAP   Spinal stenosis of cervical region    Suicide attempt [facility])    Vertigo 6 yrs ago   hx of    Patient Active Problem List   Diagnosis Date Noted   Nausea and vomiting    OSA (obstructive sleep apnea) 02/08/2017   Vitamin D  deficiency 07/21/2016   Hx of suicide attempt 07/19/2016   PTSD (post-traumatic stress disorder) 07/19/2016   Migraine headache 07/19/2016   RLS (restless legs syndrome) 07/01/2016   Osteoporosis 07/01/2016   Trochanteric bursitis 03/17/2016   Prediabetes 03/17/2016   Gastritis    Pituitary adenoma (HCC) 10/02/2015   Borderline personality disorder (HCC) 02/10/2015   Chronic pain syndrome 02/10/2015   Arthritis 02/10/2015   Hx of colonic polyps    Seasonal allergies 12/21/2013   Fibromyalgia 12/21/2013   Moderate persistent asthma without complication 06/20/2013   Chronic nausea 06/20/2013   Bipolar affective disorder (HCC) 06/20/2013   Hx of cervical cancer 07/20/2012   Lumbar herniated disc 07/20/2012  Hyperlipidemia 07/20/2012   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    Past Surgical History:  Procedure Laterality Date   ANTERIOR AND POSTERIOR REPAIR  2002   done with bladder tact   BILATERAL SALPINGOOPHORECTOMY  2004   pain from scar tissue   CARDIAC CATHETERIZATION  2008   [facility] - neg - report in paper chart   CATARACT EXTRACTION Bilateral 2007   CHOLECYSTECTOMY     COLONOSCOPY N/A 01/04/2015   Procedure: COLONOSCOPY;  Surgeon: Rogelia Copping, MD;  Location: [facility];  Service: Gastroenterology;  Laterality: N/A;  with biopsies   CYSTOSCOPY W/ RETROGRADES Right 10/26/2016   Procedure: CYSTOSCOPY WITH RETROGRADE PYELOGRAM;  Surgeon: Rosina Riis, MD;  Location: [facility];  Service: Urology;  Laterality: Right;   CYSTOSCOPY WITH STENT PLACEMENT Left 10/26/2016   Procedure: CYSTOSCOPY WITH STENT PLACEMENT;  Surgeon: Rosina Riis, MD;   Location: [facility];  Service: Urology;  Laterality: Left;   ESOPHAGOGASTRODUODENOSCOPY (EGD) WITH PROPOFOL  N/A 12/30/2015   Procedure: ESOPHAGOGASTRODUODENOSCOPY (EGD) WITH PROPOFOL ;  Surgeon: Rogelia Copping, MD;  Location: [facility];  Service: Endoscopy;  Laterality: N/A;   ESOPHAGOGASTRODUODENOSCOPY (EGD) WITH PROPOFOL  N/A 08/12/2017   Procedure: ESOPHAGOGASTRODUODENOSCOPY (EGD) WITH PROPOFOL ;  Surgeon: Copping Rogelia, MD;  Location: [facility];  Service: Endoscopy;  Laterality: N/A;  CPAP   FOOT SURGERY     multiple   SEPTOPLASTY     SHOULDER ARTHROSCOPY Bilateral 2008   TONSILLECTOMY  6   TUBAL LIGATION     URETEROSCOPY WITH HOLMIUM LASER LITHOTRIPSY Left 10/26/2016   Procedure: URETEROSCOPY WITH HOLMIUM LASER LITHOTRIPSY;  Surgeon: Rosina Riis, MD;  Location: [facility];  Service: Urology;  Laterality: Left;   VAGINAL HYSTERECTOMY  1989   cervical cancer age 70    OB History   No obstetric history on file.      Home Medications    Prior to Admission medications   Medication Sig Start Date End Date Taking? Authorizing Provider  aspirin EC 81 MG tablet Take 81 mg by mouth daily. Swallow whole.   Yes [provider]  doxycycline  (VIBRAMYCIN ) 100 MG capsule Take 1 capsule (100 mg total) by mouth 2 (two) times daily for 5 days. 03/10/24 03/15/24 Yes Bernardino Ditch, NP  fluticasone-salmeterol (ADVAIR DISKUS) 250-50 MCG/ACT AEPB Advair Diskus 250 mcg-50 mcg/dose powder for inhalation   1 inhalation twice a day by inhalation route. 07/22/12  Yes [provider]  furosemide (LASIX) 20 MG tablet Take 20 mg by mouth daily. 03/01/24  Yes [provider]  montelukast (SINGULAIR) 5 MG chewable tablet Chew 10 mg by mouth. 03/05/24 05/04/24 Yes [provider]  predniSONE  (DELTASONE ) 20 MG tablet Take 20 mg by mouth daily with breakfast.   Yes [provider]  amLODipine (NORVASC) 2.5 MG tablet Take 1 tablet by mouth daily. 03/24/23 03/23/24   [provider]  buPROPion (WELLBUTRIN SR) 200 MG 12 hr tablet Take 200 mg by mouth 3 (three) times daily. 09/23/19   [provider]  Cholecalciferol (VITAMIN D3) 2000 units TABS Take 2,000 Units by mouth daily.     [provider]  cyclobenzaprine  (FLEXERIL ) 10 MG tablet Take 1 tablet (10 mg total) by mouth 3 (three) times daily as needed for muscle spasms. 04/09/17   Servando Hire, MD  CYMBALTA 20 MG capsule Take 1 capsule every day by oral route in the evening. 07/10/23   [provider]  esomeprazole  (NEXIUM ) 40 MG capsule Take 40 mg by mouth daily at 12  noon.    [provider]  meloxicam (MOBIC) 7.5 MG tablet Take 1 tablet by mouth daily. 06/01/23   [provider]  ondansetron  (ZOFRAN -ODT) 8 MG disintegrating tablet Take 1 tablet (8 mg total) by mouth every 8 (eight) hours as needed for nausea or vomiting. 01/19/18   Jinny Carmine, MD  pramipexole (MIRAPEX) 1 MG tablet Take 1 mg by mouth in the morning and at bedtime.    [provider]  pregabalin (LYRICA) 25 MG capsule Take 25 mg by mouth 2 (two) times daily.    [provider]  rOPINIRole (REQUIP) 2 MG tablet     [provider]  rosuvastatin (CRESTOR) 5 MG tablet Take 5 mg by mouth daily. am    [provider]  VENTOLIN  HFA 108 (90 Base) MCG/ACT inhaler INHALE 1-2 PUFFS INTO THE  LUNGS EVERY 6 HOURS AS  NEEDED FOR WHEEZING OR  SHORTNESS OF BREATH. 04/16/17   Justus Leita DEL, MD  rizatriptan  (MAXALT ) 10 MG tablet Take 10 mg by mouth as needed for migraine. May repeat in 2 hours if needed  11/11/19  [provider]    Family History Family History  Problem Relation Age of Onset   Asthma Mother    Diabetes Mother    Breast cancer Mother    Melanoma Mother    Heart attack Father 48   Breast cancer Sister    Brain cancer Brother    Kidney cancer Neg Hx    Bladder Cancer Neg Hx    Prostate cancer Neg Hx     Social History Social History    Tobacco Use   Smoking status: Never   Smokeless tobacco: Never  Vaping Use   Vaping status: Never Used  Substance Use Topics   Alcohol use: No   Drug use: No     Allergies   Ciprofloxacin, Morphine, Tapentadol, Compazine [prochlorperazine edisylate], Haldol [haloperidol], Amoxicillin, Morphine and codeine , Oxybutynin , Pineapple, Sulfa antibiotics, Tape, and Other   Review of Systems Review of Systems  Constitutional:  Negative for fever.  Skin:  Positive for wound.  Neurological:  Positive for numbness.     Physical Exam Triage Vital Signs ED Triage Vitals  Encounter Vitals Group     BP      Girls Systolic BP Percentile      Girls Diastolic BP Percentile      Boys Systolic BP Percentile      Boys Diastolic BP Percentile      Pulse      Resp      Temp      Temp src      SpO2      Weight      Height      Head Circumference      Peak Flow      Pain Score      Pain Loc      Pain Education      Exclude from Growth Chart    No data found.  Updated Vital Signs BP (!) 149/85 (BP Location: Right Arm)   Pulse 90   Temp 98.6 F (37 C) (Oral)   Resp 14   Ht 5' 1 (1.549 m)   Wt 229 lb 0.9 oz (103.9 kg)   SpO2 97%   BMI 43.28 kg/m   Visual Acuity Right Eye Distance:   Left Eye Distance:   Bilateral Distance:    Right Eye Near:   Left Eye Near:    Bilateral Near:  Physical Exam Vitals and nursing note reviewed.  Constitutional:      Appearance: Normal appearance. She is not ill-appearing.  HENT:     Head: Normocephalic and atraumatic.  Musculoskeletal:        General: Signs of injury present.  Skin:    General: Skin is warm and dry.     Capillary Refill: Capillary refill takes less than 2 seconds.     Findings: No bruising or erythema.  Neurological:     General: No focal deficit present.     Mental Status: She is alert and oriented to person, place, and time.      UC Treatments / Results  Labs (all labs ordered are listed, but only  abnormal results are displayed) Labs Reviewed - No data to display  EKG   Radiology No results found.  Procedures Procedures (including critical care time)  Medications Ordered in UC Medications - No data to display  Initial Impression / Assessment and Plan / UC Course  I have reviewed the triage vital signs and the nursing notes.  Pertinent labs & imaging results that were available during my care of the patient were reviewed by me and considered in my medical decision making (see chart for details).   Patient is a pleasant, nontoxic-appearing 63 year old female presenting for evaluation of a laceration to the distal anterior right lower extremity that she sustained this morning.  She is unsure if a shelf, piece of glass, or piece of ceramic that fell caught her.  She reports that she was bleeding significantly so she applied superglue which arrested the bleeding as well as several Band-Aids.  There is no active bleeding from the site at present.  She is endorsing numbness in her 3rd, 4th, and 5th toes but she has full range of motion.  Resisted dorsiflexion and plantarflexion are 5/5 and her DP and PT pulses are 2+.  As you can see the image above, the wound is approximately 1.5 cm long.  The wound was cleansed with alcohol and anesthetized with 1 mL of 1% lidocaine  with epi.  Once good anesthesia was achieved I cleansed the wound with chlorhexidine and saline, explored the depth.  It does not extend down to the muscle fascia layer.  No foreign bodies noted.  I then draped the wound in a sterile fashion and closed the laceration with 2 simple interrupted sutures of 4-0 Prolene.  I again cleansed the wound with chlorhexidine saline, dried it with sterile gauze, and applied bacitracin and nonstick dressing secured with Coban.  Patient tolerated procedure well.  I will discharge patient home on doxycycline  100 mg twice daily for 5 days to prevent infection.  I given her home care  instructions and advised her to return in 10 days for suture removal.  Sooner if she develops any signs of infection which I have reviewed.   Final Clinical Impressions(s) / UC Diagnoses   Final diagnoses:  Laceration of right lower extremity, initial encounter     Discharge Instructions      Leave the dressing in place for the next 24 hours.  After 24 hours remove the dressing, wash the wound with warm water  and soap, pat it dry, and apply a thin smear of bacitracin.  Clean the wound daily and apply bacitracin for the first 2 days.  After that a scab should have started to form and you can leave the wound open to air when you are at home and cover with a Band-Aid when you go  out in public.  Take the doxycycline  twice daily for 5 days for prevention of wound infection.  Sutures remain in place for 10 days, please return here or see your primary care provider for removal.  If you develop any redness at the wound site, swelling, pain, drainage, red streaks going up your hand, or fever please return for reevaluation.      ED Prescriptions     Medication Sig Dispense Auth. Provider   doxycycline  (VIBRAMYCIN ) 100 MG capsule Take 1 capsule (100 mg total) by mouth 2 (two) times daily for 5 days. 10 capsule Bernardino Ditch, NP      PDMP not reviewed this encounter.   Bernardino Ditch, NP 03/10/24 [phone]

## 2024-03-10 NOTE — ED Triage Notes (Signed)
 Patient states that she was cleaning the bathroom toilet around 4:30 this morning and hit her right ankle and lower leg on the shelf or a piece of glass.  Patient has bandage on it at this time.  Patient states that something cut her right lower leg.

## 2024-03-18 ENCOUNTER — Ambulatory Visit: Admission: EM | Admit: 2024-03-18 | Discharge: 2024-03-18 | Disposition: A | Discharge status: Home / Self Care

## 2024-03-18 ENCOUNTER — Encounter: Payer: Self-pay | Admitting: Emergency Medicine

## 2024-03-18 DIAGNOSIS — Z5189 Encounter for other specified aftercare: Secondary | ICD-10-CM

## 2024-03-18 DIAGNOSIS — Z4802 Encounter for removal of sutures: Secondary | ICD-10-CM

## 2024-03-18 NOTE — ED Provider Notes (Signed)
 [facility]    CSN: [id number] Arrival date & time: 03/18/24  0800      History   Chief Complaint Chief Complaint  Patient presents with   Wound Check    HPI Brandi Bell is a 63 y.o. female.   HPI  63 year old female with past medical history significant for bipolar, asthma, arthritis, anxiety, and anemia presents for a wound check.  She sustained a laceration to her anterior aspect of the right lower extremity on 03/10/2024.  I saw her at that time, irrigated the wound, and closed it with 2 sutures.  She was discharged home on doxycycline  which she reports she has taken.  One of the sutures has fallen out.  She denies any fever and reports that she has had some clear drainage from the wound.  She is concerned because there is some redness around the wound and it pulls when she walks.  Past Medical History:  Diagnosis Date   Anemia    hx of   Anxiety    Arthritis    osteo Back, Feet, Hands   Asthma    [facility] admission - on Bipap - 7/13 - report on paper chart/ flare 3 mos ago   Bipolar disorder (HCC)    Bipolar disorder (HCC)    Bulging lumbar disc    Cancer (HCC)    cervical   Cataract    Depression    Elevated LFTs    having ultrasound [facility] - 12/14/14   GERD (gastroesophageal reflux disease)    vomiting   Headache    migraines, every three or 4 days/ stress related   Heart attack (HCC)    hx of/ Dr. 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, self repaired   History of kidney stones    History of migraine headaches    Hypertension    Echo -1/16 - report on paper chart/controlled on meds   IBS (irritable bowel syndrome)    Microhematuria    Motion sickness    Osteoporosis    Pneumonia    hx of   PONV (postoperative nausea and vomiting)    PTSD (post-traumatic stress disorder)    Restless leg syndrome    Rhinitis    Shortness of breath dyspnea    with exertion   Sleep apnea    central and obstructive/ CPAP    Spinal stenosis of cervical region    Suicide attempt [facility])    Vertigo 6 yrs ago   hx of    Patient Active Problem List   Diagnosis Date Noted   Nausea and vomiting    OSA (obstructive sleep apnea) 02/08/2017   Vitamin D  deficiency 07/21/2016   Hx of suicide attempt 07/19/2016   PTSD (post-traumatic stress disorder) 07/19/2016   Migraine headache 07/19/2016   RLS (restless legs syndrome) 07/01/2016   Osteoporosis 07/01/2016   Trochanteric bursitis 03/17/2016   Prediabetes 03/17/2016   Gastritis    Pituitary adenoma (HCC) 10/02/2015   Borderline personality disorder (HCC) 02/10/2015   Chronic pain syndrome 02/10/2015   Arthritis 02/10/2015   Hx of colonic polyps    Seasonal allergies 12/21/2013   Fibromyalgia 12/21/2013   Moderate persistent asthma without complication 06/20/2013   Chronic nausea 06/20/2013   Bipolar affective disorder (HCC) 06/20/2013   Hx of cervical cancer 07/20/2012   Lumbar herniated disc 07/20/2012   Hyperlipidemia 07/20/2012   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    Past Surgical  History:  Procedure Laterality Date   ANTERIOR AND POSTERIOR REPAIR  2002   done with bladder tact   BILATERAL SALPINGOOPHORECTOMY  2004   pain from scar tissue   CARDIAC CATHETERIZATION  2008   [facility] - neg - report in paper chart   CATARACT EXTRACTION Bilateral 2007   CHOLECYSTECTOMY     COLONOSCOPY N/A 01/04/2015   Procedure: COLONOSCOPY;  Surgeon: Rogelia Copping, MD;  Location: [facility];  Service: Gastroenterology;  Laterality: N/A;  with biopsies   CYSTOSCOPY W/ RETROGRADES Right 10/26/2016   Procedure: CYSTOSCOPY WITH RETROGRADE PYELOGRAM;  Surgeon: Rosina Riis, MD;  Location: [facility];  Service: Urology;  Laterality: Right;   CYSTOSCOPY WITH STENT PLACEMENT Left 10/26/2016   Procedure: CYSTOSCOPY WITH STENT PLACEMENT;  Surgeon: Rosina Riis, MD;  Location: [facility];  Service: Urology;  Laterality: Left;   ESOPHAGOGASTRODUODENOSCOPY (EGD)  WITH PROPOFOL  N/A 12/30/2015   Procedure: ESOPHAGOGASTRODUODENOSCOPY (EGD) WITH PROPOFOL ;  Surgeon: Rogelia Copping, MD;  Location: [facility];  Service: Endoscopy;  Laterality: N/A;   ESOPHAGOGASTRODUODENOSCOPY (EGD) WITH PROPOFOL  N/A 08/12/2017   Procedure: ESOPHAGOGASTRODUODENOSCOPY (EGD) WITH PROPOFOL ;  Surgeon: Copping Rogelia, MD;  Location: [facility];  Service: Endoscopy;  Laterality: N/A;  CPAP   FOOT SURGERY     multiple   SEPTOPLASTY     SHOULDER ARTHROSCOPY Bilateral 2008   TONSILLECTOMY  6   TUBAL LIGATION     URETEROSCOPY WITH HOLMIUM LASER LITHOTRIPSY Left 10/26/2016   Procedure: URETEROSCOPY WITH HOLMIUM LASER LITHOTRIPSY;  Surgeon: Rosina Riis, MD;  Location: [facility];  Service: Urology;  Laterality: Left;   VAGINAL HYSTERECTOMY  1989   cervical cancer age 12    OB History   No obstetric history on file.      Home Medications    Prior to Admission medications   Medication Sig Start Date End Date Taking? Authorizing Provider  amLODipine (NORVASC) 2.5 MG tablet Take 1 tablet by mouth daily. 03/24/23 03/23/24  [provider]  aspirin EC 81 MG tablet Take 81 mg by mouth daily. Swallow whole.    [provider]  buPROPion (WELLBUTRIN SR) 200 MG 12 hr tablet Take 200 mg by mouth 3 (three) times daily. 09/23/19   [provider]  Cholecalciferol (VITAMIN D3) 2000 units TABS Take 2,000 Units by mouth daily.     [provider]  cyclobenzaprine  (FLEXERIL ) 10 MG tablet Take 1 tablet (10 mg total) by mouth 3 (three) times daily as needed for muscle spasms. 04/09/17   Servando Hire, MD  CYMBALTA 20 MG capsule Take 1 capsule every day by oral route in the evening. 07/10/23   [provider]  esomeprazole  (NEXIUM ) 40 MG capsule Take 40 mg by mouth daily at 12 noon.    [provider]  fluticasone-salmeterol (ADVAIR DISKUS) 250-50 MCG/ACT AEPB Advair Diskus 250 mcg-50 mcg/dose powder for inhalation   1 inhalation twice a  day by inhalation route. 07/22/12   [provider]  furosemide (LASIX) 20 MG tablet Take 20 mg by mouth daily. 03/01/24   [provider]  meloxicam (MOBIC) 7.5 MG tablet Take 1 tablet by mouth daily. 06/01/23   [provider]  montelukast (SINGULAIR) 5 MG chewable tablet Chew 10 mg by mouth. 03/05/24 05/04/24  [provider]  ondansetron  (ZOFRAN -ODT) 8 MG disintegrating tablet Take 1 tablet (8 mg total) by mouth every 8 (eight) hours as needed for nausea or vomiting. 01/19/18   Copping Rogelia, MD  pramipexole (MIRAPEX) 1 MG tablet Take  1 mg by mouth in the morning and at bedtime.    [provider]  predniSONE  (DELTASONE ) 20 MG tablet Take 20 mg by mouth daily with breakfast.    [provider]  pregabalin (LYRICA) 25 MG capsule Take 25 mg by mouth 2 (two) times daily.    [provider]  rOPINIRole (REQUIP) 2 MG tablet     [provider]  rosuvastatin (CRESTOR) 5 MG tablet Take 5 mg by mouth daily. am    [provider]  VENTOLIN  HFA 108 (90 Base) MCG/ACT inhaler INHALE 1-2 PUFFS INTO THE  LUNGS EVERY 6 HOURS AS  NEEDED FOR WHEEZING OR  SHORTNESS OF BREATH. 04/16/17   Justus Leita DEL, MD  rizatriptan  (MAXALT ) 10 MG tablet Take 10 mg by mouth as needed for migraine. May repeat in 2 hours if needed  11/11/19  [provider]    Family History Family History  Problem Relation Age of Onset   Asthma Mother    Diabetes Mother    Breast cancer Mother    Melanoma Mother    Heart attack Father 63   Breast cancer Sister    Brain cancer Brother    Kidney cancer Neg Hx    Bladder Cancer Neg Hx    Prostate cancer Neg Hx     Social History Social History   Tobacco Use   Smoking status: Never   Smokeless tobacco: Never  Vaping Use   Vaping status: Never Used  Substance Use Topics   Alcohol use: No   Drug use: No     Allergies   Ciprofloxacin, Morphine, Tapentadol, Compazine [prochlorperazine  edisylate], Haldol [haloperidol], Amoxicillin, Morphine and codeine , Oxybutynin , Pineapple, Sulfa antibiotics, Tape, and Other   Review of Systems Review of Systems  Constitutional:  Negative for fever.  Skin:  Positive for color change and wound.     Physical Exam Triage Vital Signs ED Triage Vitals  Encounter Vitals Group     BP 03/18/24 0813 133/80     Girls Systolic BP Percentile --      Girls Diastolic BP Percentile --      Boys Systolic BP Percentile --      Boys Diastolic BP Percentile --      Pulse Rate 03/18/24 0813 77     Resp 03/18/24 0813 14     Temp 03/18/24 0813 98.6 F (37 C)     Temp Source 03/18/24 0813 Oral     SpO2 03/18/24 0813 97 %     Weight 03/18/24 0812 229 lb 0.9 oz (103.9 kg)     Height 03/18/24 0812 5' 1 (1.549 m)     Head Circumference --      Peak Flow --      Pain Score 03/18/24 0811 3     Pain Loc --      Pain Education --      Exclude from Growth Chart --    No data found.  Updated Vital Signs BP 133/80 (BP Location: Right Arm)   Pulse 77   Temp 98.6 F (37 C) (Oral)   Resp 14   Ht 5' 1 (1.549 m)   Wt 229 lb 0.9 oz (103.9 kg)   SpO2 97%   BMI 43.28 kg/m   Visual Acuity Right Eye Distance:   Left Eye Distance:   Bilateral Distance:    Right Eye Near:   Left Eye Near:    Bilateral Near:     Physical Exam Vitals and  nursing note reviewed.  Constitutional:      Appearance: Normal appearance. She is not ill-appearing.  HENT:     Head: Normocephalic and atraumatic.  Skin:    General: Skin is warm and dry.     Capillary Refill: Capillary refill takes less than 2 seconds.     Findings: Erythema present.  Neurological:     General: No focal deficit present.     Mental Status: She is alert and oriented to person, place, and time.      UC Treatments / Results  Labs (all labs ordered are listed, but only abnormal results are displayed) Labs Reviewed - No data to display  EKG   Radiology No results  found.  Procedures Procedures (including critical care time)  Medications Ordered in UC Medications - No data to display  Initial Impression / Assessment and Plan / UC Course  I have reviewed the triage vital signs and the nursing notes.  Pertinent labs & imaging results that were available during my care of the patient were reviewed by me and considered in my medical decision making (see chart for details).   Patient is a very pleasant, nontoxic-appearing 63 year old female presenting for evaluation of a wound check as outlined in HPI above.  As you can see the above, there is some mild erythema surrounding the wound but the erythema is not hot.  There is no fluctuance or induration.  No appreciable discharge.  One of the sutures has come out.  The other suture remains intact.  I do think the suture can be removed so I cleansed the area with alcohol and remove the remaining suture.  I again cleansed the skin surface following suture removal.  I have advised her to monitor the area for any increased redness, swelling, or pus drainage.  I do suspect that the redness is normal inflammatory response as a result of the healing process.  I did advise the patient that she can apply some topical Voltaren  gel to help with inflammation and discomfort.  The pulm she feels is most likely a result of the healing process and the fact that her flexor tendons go up the anterior tibia.  That should get better as time goes on.  The patient has completed her doxycycline .   Final Clinical Impressions(s) / UC Diagnoses   Final diagnoses:  Visit for wound check  Visit for suture removal     Discharge Instructions      As we discussed, the wound appears to be healing well.  The redness is a result of the normal healing process.  The wound is not hot, swollen, firm, and I do not appreciate any pustular discharge.  Continue to monitor for any increased redness, swelling, pus drainage, red streaks going to the  leg, or fever.  I do not anticipate these will happen as you completed a round of doxycycline .  You may apply a pea-sized amount of topical Voltaren  gel 4 times a day to the wound as needed for any pain or discomfort.  The pulling you are feeling is most likely result of the healing process and the fact that your flexor tendons which control your foot go up your anterior tibia where you have sustained the wound.  That probably should get better with time.     ED Prescriptions   None    PDMP not reviewed this encounter.   Bernardino Ditch, NP 03/18/24 [phone]

## 2024-03-18 NOTE — Discharge Instructions (Addendum)
 As we discussed, the wound appears to be healing well.  The redness is a result of the normal healing process.  The wound is not hot, swollen, firm, and I do not appreciate any pustular discharge.  Continue to monitor for any increased redness, swelling, pus drainage, red streaks going to the leg, or fever.  I do not anticipate these will happen as you completed a round of doxycycline .  You may apply a pea-sized amount of topical Voltaren  gel 4 times a day to the wound as needed for any pain or discomfort.  The pulling you are feeling is most likely result of the healing process and the fact that your flexor tendons which control your foot go up your anterior tibia where you have sustained the wound.  That probably should get better with time.

## 2024-03-18 NOTE — ED Triage Notes (Signed)
 Patient states that she thinks that her sutured site is red, swollen and tender for the past 2-3 days.  Patient states that she is 2 days early for her suture removal.  Patient area is just above her right ankle.

## 2024-03-20 ENCOUNTER — Ambulatory Visit: Attending: Internal Medicine

## 2024-03-20 DIAGNOSIS — R5381 Other malaise: Secondary | ICD-10-CM | POA: Insufficient documentation

## 2024-03-20 DIAGNOSIS — M5416 Radiculopathy, lumbar region: Secondary | ICD-10-CM | POA: Insufficient documentation

## 2024-03-20 DIAGNOSIS — R296 Repeated falls: Secondary | ICD-10-CM | POA: Diagnosis present

## 2024-03-20 DIAGNOSIS — M6281 Muscle weakness (generalized): Secondary | ICD-10-CM | POA: Diagnosis present

## 2024-03-20 DIAGNOSIS — R262 Difficulty in walking, not elsewhere classified: Secondary | ICD-10-CM | POA: Diagnosis present

## 2024-03-20 NOTE — Therapy (Addendum)
 OUTPATIENT PHYSICAL THERAPY THORACOLUMBAR EVALUATION   Patient Name: Brandi Bell MRN: [id number] DOB:[birth year], 63 y.o., female Today's Date: 03/21/2024  END OF SESSION:  PT End of Session - 03/21/24 0953     Visit Number 1    Number of Visits 17    Date for PT Re-Evaluation 05/16/24    Authorization Type request 2x/week x 8 weeks (Medicare PN needed at visit #10)    PT Start Time 1415    PT Stop Time 1500    PT Time Calculation (min) 45 min    Activity Tolerance Patient tolerated treatment well    Behavior During Therapy WFL for tasks assessed/performed          Past Medical History:  Diagnosis Date   Anemia    hx of   Anxiety    Arthritis    osteo Back, Feet, Hands   Asthma    [facility] admission - on Bipap - 7/13 - report on paper chart/ flare 3 mos ago   Bipolar disorder (HCC)    Bipolar disorder (HCC)    Bulging lumbar disc    Cancer (HCC)    cervical   Cataract    Depression    Elevated LFTs    having ultrasound [facility] - 12/14/14   GERD (gastroesophageal reflux disease)    vomiting   Headache    migraines, every three or 4 days/ stress related   Heart attack (HCC)    hx of/ Dr. 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, self repaired   History of kidney stones    History of migraine headaches    Hypertension    Echo -1/16 - report on paper chart/controlled on meds   IBS (irritable bowel syndrome)    Microhematuria    Motion sickness    Osteoporosis    Pneumonia    hx of   PONV (postoperative nausea and vomiting)    PTSD (post-traumatic stress disorder)    Restless leg syndrome    Rhinitis    Shortness of breath dyspnea    with exertion   Sleep apnea    central and obstructive/ CPAP   Spinal stenosis of cervical region    Suicide attempt [facility])    Vertigo 6 yrs ago   hx of   Past Surgical History:  Procedure Laterality Date   ANTERIOR AND POSTERIOR REPAIR  2002   done with bladder tact   BILATERAL  SALPINGOOPHORECTOMY  2004   pain from scar tissue   CARDIAC CATHETERIZATION  2008   [facility] - neg - report in paper chart   CATARACT EXTRACTION Bilateral 2007   CHOLECYSTECTOMY     COLONOSCOPY N/A 01/04/2015   Procedure: COLONOSCOPY;  Surgeon: Rogelia Copping, MD;  Location: [facility];  Service: Gastroenterology;  Laterality: N/A;  with biopsies   CYSTOSCOPY W/ RETROGRADES Right 10/26/2016   Procedure: CYSTOSCOPY WITH RETROGRADE PYELOGRAM;  Surgeon: Rosina Riis, MD;  Location: [facility];  Service: Urology;  Laterality: Right;   CYSTOSCOPY WITH STENT PLACEMENT Left 10/26/2016   Procedure: CYSTOSCOPY WITH STENT PLACEMENT;  Surgeon: Rosina Riis, MD;  Location: [facility];  Service: Urology;  Laterality: Left;   ESOPHAGOGASTRODUODENOSCOPY (EGD) WITH PROPOFOL  N/A 12/30/2015   Procedure: ESOPHAGOGASTRODUODENOSCOPY (EGD) WITH PROPOFOL ;  Surgeon: Rogelia Copping, MD;  Location: [facility];  Service: Endoscopy;  Laterality: N/A;   ESOPHAGOGASTRODUODENOSCOPY (EGD) WITH PROPOFOL  N/A 08/12/2017   Procedure: ESOPHAGOGASTRODUODENOSCOPY (EGD) WITH PROPOFOL ;  Surgeon: Copping Rogelia, MD;  Location: [facility];  Service: Endoscopy;  Laterality: N/A;  CPAP   FOOT SURGERY     multiple   SEPTOPLASTY     SHOULDER ARTHROSCOPY Bilateral 2008   TONSILLECTOMY  6   TUBAL LIGATION     URETEROSCOPY WITH HOLMIUM LASER LITHOTRIPSY Left 10/26/2016   Procedure: URETEROSCOPY WITH HOLMIUM LASER LITHOTRIPSY;  Surgeon: Rosina Riis, MD;  Location: [facility];  Service: Urology;  Laterality: Left;   VAGINAL HYSTERECTOMY  1989   cervical cancer age 31   Patient Active Problem List   Diagnosis Date Noted   Nausea and vomiting    OSA (obstructive sleep apnea) 02/08/2017   Vitamin D  deficiency 07/21/2016   Hx of suicide attempt 07/19/2016   PTSD (post-traumatic stress disorder) 07/19/2016   Migraine headache 07/19/2016   RLS (restless legs syndrome) 07/01/2016   Osteoporosis 07/01/2016   Trochanteric bursitis  03/17/2016   Prediabetes 03/17/2016   Gastritis    Pituitary adenoma (HCC) 10/02/2015   Borderline personality disorder (HCC) 02/10/2015   Chronic pain syndrome 02/10/2015   Arthritis 02/10/2015   Hx of colonic polyps    Seasonal allergies 12/21/2013   Fibromyalgia 12/21/2013   Moderate persistent asthma without complication 06/20/2013   Chronic nausea 06/20/2013   Bipolar affective disorder (HCC) 06/20/2013   Hx of cervical cancer 07/20/2012   Lumbar herniated disc 07/20/2012   Hyperlipidemia 07/20/2012   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 Dann, MD  REFERRING PROVIDER: Dr. Jesus  REFERRING DIAG:  Diagnosis  M54.31 (ICD-10-CM) - Sciatica, right side  M50.30 (ICD-10-CM) - Other cervical disc degeneration, unspecified cervical region  Z91.81 (ICD-10-CM) - History of falling    Rationale for Evaluation and Treatment: Rehabilitation  THERAPY DIAG:  Muscle weakness (generalized)  Physical deconditioning  Repeated falls  Difficulty in walking, not elsewhere classified  ONSET DATE: August 2025  SUBJECTIVE:                                                                                                                                                                                           SUBJECTIVE STATEMENT: Pt's main concern is she feels weak after multiple health concerns this year and multiple hospital stays (most recent in Aug 2025 for respiratory issues; per char review- recently returned to the hospital due to acute hypoxemic respiratory failure with worsening dyspnea and asthma exacerbation. )  Just had a course of home health PT- finished before outpatient PT   Started medications for breathing- those are helping; starts injection therapy this Friday (Dupixent)  Walking with a SPC, sometimes walker, and sometimes no cane.   Most  recently had a fall 2 weeks ago- physical fatigue she feels like contributes to  it  Difficulty standing stationary (bothers lower back)  Does some walking 100 m driveway, 4x wears a mask for this, feels SOB sometimes    PERTINENT HISTORY:  Just finished home health PT Was hospitalized for respiratory failure in August 2025 Has h/o low back pain- L3-4  Has had shoulder surgery b/l  PAIN:  Are you having pain? Currently 0/10 and at worst 9-10/10  PRECAUTIONS: Fall  RED FLAGS: None   WEIGHT BEARING RESTRICTIONS: No  FALLS:  Has patient fallen in last 6 months? Yes. Number of falls >3  LIVING ENVIRONMENT: Lives with: lives alone Lives in: Mobile home Stairs: 6 steps to enter/exit Has following equipment at home: Single point cane  Has family/sister support nearby where she lives   OCCUPATION: retired; enjoys Environmental manager, does puzzles  PLOF: Independent  PATIENT GOALS: to be able to walk, able to hike, able to walk at [employer] (beach trip planned for October 2025), able to travel again  NEXT MD VISIT: End of September   OBJECTIVE:  Note: Objective measures were completed at Evaluation unless otherwise noted.  DIAGNOSTIC FINDINGS: See chart review  PATIENT SURVEYS:  LEFS 40/80  COGNITION: Overall cognitive status: Within functional limits for tasks assessed     SENSATION: WFL   POSTURE: rounded shoulders   LUMBAR ROM: deferred  AROM eval  Flexion   Extension   Right lateral flexion   Left lateral flexion   Right rotation   Left rotation    (Blank rows = not tested)  LOWER EXTREMITY ROM:   deferred; pt able to transfer sit to stand  Active  Right eval Left eval  Hip flexion    Hip extension    Hip abduction    Hip adduction    Hip internal rotation    Hip external rotation    Knee flexion    Knee extension    Ankle dorsiflexion    Ankle plantarflexion    Ankle inversion    Ankle eversion     (Blank rows = not tested)  LOWER EXTREMITY MMT:    MMT Right eval Left eval  Hip flexion 4 4  Hip extension     Hip abduction    Hip adduction    Hip internal rotation    Hip external rotation    Knee flexion 4 4  Knee extension 4 4  Ankle dorsiflexion 4 4  Ankle plantarflexion    Ankle inversion    Ankle eversion     (Blank rows = not tested)   FUNCTIONAL TESTS:  5 times sit to stand: 9 seconds today   SLS: 3-5 seconds each LE   TUG 11 seconds today GAIT: Pt amb with SPC in clinic today  TREATMENT DATE: 03/24/24  Initial evaluation performed  Vital signs assessed: 98 SpO2 91 BPM  Front step up intervals: 30 seconds, x 3  PATIENT EDUCATION:  Education details: HEP, PT POC/goals Person educated: Patient Education method: Explanation Education comprehension: verbalized understanding  HOME EXERCISE PROGRAM: To be formally initiated at visit #2, encouraged pt to continue with current HEP and add front step up intervals to HEP, 3x 30 sec after her walks for LE strength/standing endurance  ASSESSMENT:  CLINICAL IMPRESSION: Patient is a 63 y.o. F who was seen today for physical therapy evaluation and treatment for a course of outpatient PT after recently being hospitalized and completing her course of home health PT.  Presents with deconditioning, muscle weakness, h/o lumbar radiculopathy, history of recent falls, decreased balance, and decreased activity tolerance.  She is an appropriate candidate for outpatient PT to address impairments below and to facilitate improved activity tolerance, improved safety during amb, and improved QOL.   OBJECTIVE IMPAIRMENTS: decreased activity tolerance, decreased balance, difficulty walking, decreased strength, impaired perceived functional ability, and pain.   ACTIVITY LIMITATIONS: standing, squatting, stairs, transfers, and locomotion level  PARTICIPATION LIMITATIONS: meal prep, cleaning, laundry, shopping, community  activity, and yard work  PERSONAL FACTORS: Past/current experiences, Time since onset of injury/illness/exacerbation, and 1-2 comorbidities: see PMH are also affecting patient's functional outcome.   REHAB POTENTIAL: Good  CLINICAL DECISION MAKING: Evolving/moderate complexity  EVALUATION COMPLEXITY: Moderate   GOALS: Goals reviewed with patient? Yes  SHORT TERM GOALS: Target date: 04/05/24  Complete 6 min walk test to establish baseline endurance for amb  Baseline:at visit #2 Goal status: INITIAL   LONG TERM GOALS: Target date: 05/16/24  Improve LEFS >10 points indicating pt is less significantly limited in her ability to perform her daily activities Baseline: 40 Goal status: INITIAL  2.  Pt will be able to amb on even/uneven surfaces x 15 min without AD to be able to go on her beach trip and walk to/from beach at end of October Baseline: SPC used for amb on flat surfaces Goal status: INITIAL  3.  Pt will be able to perform >20 min consecutive standing housechores to complete her housework/meal prep/laundry without being limited by fatigue/SOB Baseline: 10 min intervals Goal status: INITIAL    PLAN:  PT FREQUENCY: 2x/week  PT DURATION: 8 weeks  PLANNED INTERVENTIONS: 97110-Therapeutic exercises, 97530- Therapeutic activity, [id number]- Neuromuscular re-education, 97535- Self Care, [id number]- Manual therapy, and Patient/Family education.  PLAN FOR NEXT SESSION: 6 min walk test; endurance/conditioning, LE strengthening, stair intervals  Vernell Reges, PT, DPT, OCS  #[id number]  Vernell FORBES Reges, PT 03/21/2024, 9:53 AM

## 2024-03-21 ENCOUNTER — Ambulatory Visit: Payer: Self-pay | Admitting: Emergency Medicine

## 2024-03-21 ENCOUNTER — Ambulatory Visit
Admission: EM | Admit: 2024-03-21 | Discharge: 2024-03-21 | Disposition: A | Discharge status: Home / Self Care | Attending: Emergency Medicine | Admitting: Emergency Medicine

## 2024-03-21 ENCOUNTER — Encounter: Payer: Self-pay | Admitting: Emergency Medicine

## 2024-03-21 DIAGNOSIS — Z5189 Encounter for other specified aftercare: Secondary | ICD-10-CM | POA: Diagnosis present

## 2024-03-21 DIAGNOSIS — L03115 Cellulitis of right lower limb: Secondary | ICD-10-CM | POA: Insufficient documentation

## 2024-03-21 DIAGNOSIS — R233 Spontaneous ecchymoses: Secondary | ICD-10-CM | POA: Diagnosis present

## 2024-03-21 LAB — CBC WITH DIFFERENTIAL/PLATELET
Abs Immature Granulocytes: 0.03 K/uL (ref 0.00–0.07)
Basophils Absolute: 0 K/uL (ref 0.0–0.1)
Basophils Relative: 0 %
Eosinophils Absolute: 0.3 K/uL (ref 0.0–0.5)
Eosinophils Relative: 3 %
HCT: 42.1 % (ref 36.0–46.0)
Hemoglobin: 13.9 g/dL (ref 12.0–15.0)
Immature Granulocytes: 0 %
Lymphocytes Relative: 17 %
Lymphs Abs: 1.6 K/uL (ref 0.7–4.0)
MCH: 28.5 pg (ref 26.0–34.0)
MCHC: 33 g/dL (ref 30.0–36.0)
MCV: 86.3 fL (ref 80.0–100.0)
Monocytes Absolute: 0.8 K/uL (ref 0.1–1.0)
Monocytes Relative: 9 %
Neutro Abs: 6.3 K/uL (ref 1.7–7.7)
Neutrophils Relative %: 71 %
Platelets: 238 K/uL (ref 150–400)
RBC: 4.88 MIL/uL (ref 3.87–5.11)
RDW: 13.6 % (ref 11.5–15.5)
WBC: 9.1 K/uL (ref 4.0–10.5)
nRBC: 0 % (ref 0.0–0.2)

## 2024-03-21 MED ORDER — CEPHALEXIN 500 MG PO CAPS
500.0000 mg | ORAL_CAPSULE | Freq: Four times a day (QID) | ORAL | 0 refills | Status: AC
Start: 2024-03-21 — End: 2024-03-28

## 2024-03-21 MED ORDER — TRIAMCINOLONE ACETONIDE 0.1 % EX OINT: 1.0000 | TOPICAL_OINTMENT | Freq: Two times a day (BID) | CUTANEOUS | 0 refills | Status: AC

## 2024-03-21 NOTE — Discharge Instructions (Signed)
 Finish the Keflex , even if you feel better this is for cellulitis.  I am sending off a CBC.  We will contact you if and only if it comes back abnormal.  Try the triamcinolone  ointment.  600 mg of ibuprofen , 1000 mg of Tylenol  3-4 times a day as needed for pain.

## 2024-03-21 NOTE — ED Provider Notes (Signed)
 HPI  SUBJECTIVE:  Brandi Bell is a 63 y.o. female who presents with erythema, worsening pain and swelling around a right lower extremity laceration that she sustained on 9/5.  No fevers, purulent drainage, recurrent trauma, she denies wearing constrictive clothing or bandage.  She has been applying Voltaren  topical gel for inflammation with some improvement in the pain.  Symptoms are worse with dorsiflexion.  She was initially seen here on 9/5 for a laceration and was sent home with 5 days of doxycycline  for prophylaxis.  She returned on 9/13 for wound check, and had some redness around the wound at that time.  It was thought that the redness was due to inflammation, not infection, and was advised to apply topical Voltaren  gel.  She has a past medical history of asthma, migraines, MI, hypertension, fibromyalgia, bipolar.  No history of MRSA.  PCP: [facility] [place]  Past Medical History:  Diagnosis Date   Anemia    hx of   Anxiety    Arthritis    osteo Back, Feet, Hands   Asthma    [facility] admission - on Bipap - 7/13 - report on paper chart/ flare 3 mos ago   Bipolar disorder (HCC)    Bipolar disorder (HCC)    Bulging lumbar disc    Cancer (HCC)    cervical   Cataract    Depression    Elevated LFTs    having ultrasound [facility] - 12/14/14   GERD (gastroesophageal reflux disease)    vomiting   Headache    migraines, every three or 4 days/ stress related   Heart attack (HCC)    hx of/ Dr. 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, self repaired   History of kidney stones    History of migraine headaches    Hypertension    Echo -1/16 - report on paper chart/controlled on meds   IBS (irritable bowel syndrome)    Microhematuria    Motion sickness    Osteoporosis    Pneumonia    hx of   PONV (postoperative nausea and vomiting)    PTSD (post-traumatic stress disorder)    Restless leg syndrome    Rhinitis    Shortness of breath dyspnea    with exertion    Sleep apnea    central and obstructive/ CPAP   Spinal stenosis of cervical region    Suicide attempt [facility])    Vertigo 6 yrs ago   hx of    Past Surgical History:  Procedure Laterality Date   ANTERIOR AND POSTERIOR REPAIR  2002   done with bladder tact   BILATERAL SALPINGOOPHORECTOMY  2004   pain from scar tissue   CARDIAC CATHETERIZATION  2008   [facility] - neg - report in paper chart   CATARACT EXTRACTION Bilateral 2007   CHOLECYSTECTOMY     COLONOSCOPY N/A 01/04/2015   Procedure: COLONOSCOPY;  Surgeon: Rogelia Copping, MD;  Location: [facility];  Service: Gastroenterology;  Laterality: N/A;  with biopsies   CYSTOSCOPY W/ RETROGRADES Right 10/26/2016   Procedure: CYSTOSCOPY WITH RETROGRADE PYELOGRAM;  Surgeon: Rosina Riis, MD;  Location: [facility];  Service: Urology;  Laterality: Right;   CYSTOSCOPY WITH STENT PLACEMENT Left 10/26/2016   Procedure: CYSTOSCOPY WITH STENT PLACEMENT;  Surgeon: Rosina Riis, MD;  Location: [facility];  Service: Urology;  Laterality: Left;   ESOPHAGOGASTRODUODENOSCOPY (EGD) WITH PROPOFOL  N/A 12/30/2015   Procedure: ESOPHAGOGASTRODUODENOSCOPY (EGD) WITH PROPOFOL ;  Surgeon: Rogelia Copping, MD;  Location: [facility];  Service: Endoscopy;  Laterality: N/A;   ESOPHAGOGASTRODUODENOSCOPY (EGD) WITH PROPOFOL  N/A 08/12/2017   Procedure: ESOPHAGOGASTRODUODENOSCOPY (EGD) WITH PROPOFOL ;  Surgeon: Jinny Carmine, MD;  Location: [facility];  Service: Endoscopy;  Laterality: N/A;  CPAP   FOOT SURGERY     multiple   SEPTOPLASTY     SHOULDER ARTHROSCOPY Bilateral 2008   TONSILLECTOMY  6   TUBAL LIGATION     URETEROSCOPY WITH HOLMIUM LASER LITHOTRIPSY Left 10/26/2016   Procedure: URETEROSCOPY WITH HOLMIUM LASER LITHOTRIPSY;  Surgeon: Rosina Riis, MD;  Location: [facility];  Service: Urology;  Laterality: Left;   VAGINAL HYSTERECTOMY  1989   cervical cancer age 48    Family History  Problem Relation Age of Onset   Asthma Mother    Diabetes Mother     Breast cancer Mother    Melanoma Mother    Heart attack Father 33   Breast cancer Sister    Brain cancer Brother    Kidney cancer Neg Hx    Bladder Cancer Neg Hx    Prostate cancer Neg Hx     Social History   Tobacco Use   Smoking status: Never   Smokeless tobacco: Never  Vaping Use   Vaping status: Never Used  Substance Use Topics   Alcohol use: No   Drug use: No    No current facility-administered medications for this encounter.  Current Outpatient Medications:    cephALEXin  (KEFLEX ) 500 MG capsule, Take 1 capsule (500 mg total) by mouth 4 (four) times daily for 7 days., Disp: 28 capsule, Rfl: 0   triamcinolone  ointment (KENALOG ) 0.1 %, Apply 1 Application topically 2 (two) times daily., Disp: 30 g, Rfl: 0   amLODipine (NORVASC) 2.5 MG tablet, Take 1 tablet by mouth daily., Disp: , Rfl:    aspirin EC 81 MG tablet, Take 81 mg by mouth daily. Swallow whole., Disp: , Rfl:    buPROPion (WELLBUTRIN SR) 200 MG 12 hr tablet, Take 200 mg by mouth 3 (three) times daily., Disp: , Rfl:    Cholecalciferol (VITAMIN D3) 2000 units TABS, Take 2,000 Units by mouth daily. , Disp: , Rfl:    CYMBALTA 20 MG capsule, Take 1 capsule every day by oral route in the evening., Disp: , Rfl:    esomeprazole  (NEXIUM ) 40 MG capsule, Take 40 mg by mouth daily at 12 noon., Disp: , Rfl:    fluticasone-salmeterol (ADVAIR DISKUS) 250-50 MCG/ACT AEPB, Advair Diskus 250 mcg-50 mcg/dose powder for inhalation   1 inhalation twice a day by inhalation route., Disp: , Rfl:    furosemide (LASIX) 20 MG tablet, Take 20 mg by mouth daily., Disp: , Rfl:    montelukast (SINGULAIR) 5 MG chewable tablet, Chew 10 mg by mouth., Disp: , Rfl:    pramipexole (MIRAPEX) 1 MG tablet, Take 1 mg by mouth in the morning and at bedtime., Disp: , Rfl:    pregabalin (LYRICA) 25 MG capsule, Take 25 mg by mouth 2 (two) times daily., Disp: , Rfl:    rOPINIRole (REQUIP) 2 MG tablet, , Disp: , Rfl:    rosuvastatin (CRESTOR) 5 MG tablet, Take 5  mg by mouth daily. am, Disp: , Rfl:    VENTOLIN  HFA 108 (90 Base) MCG/ACT inhaler, INHALE 1-2 PUFFS INTO THE  LUNGS EVERY 6 HOURS AS  NEEDED FOR WHEEZING OR  SHORTNESS OF BREATH., Disp: 36 g, Rfl: 5  Allergies  Allergen Reactions   Ciprofloxacin Anaphylaxis and Other (See Comments)  Other Reaction: Mouth Swelling Yrs ago   Morphine Nausea And Vomiting    And vomiting   Tapentadol Itching   Compazine [Prochlorperazine Edisylate] Rash   Haldol [Haloperidol] Rash   Amoxicillin Hives    ALL CILLINS   Morphine And Codeine  Nausea And Vomiting   Oxybutynin  Other (See Comments)    Blurred vision    Pineapple Other (See Comments)    Reaction:  Mouth blisters   Sulfa Antibiotics Hives   Tape Other (See Comments)    Reaction:  Clear tape tears skin.  Tegaderm and paper tape are ok.     Other Other (See Comments) and Rash    Reaction:  Clear tape tears skin.  Tegaderm and paper tape are ok.   Reaction:  Clear tape tears skin.  Tegaderm and paper tape are ok.       ROS  As noted in HPI.   Physical Exam  BP 128/88 (BP Location: Left Arm)   Pulse 83   Temp 99.4 F (37.4 C) (Oral)   Resp 15   SpO2 95%   Constitutional: Well developed, well nourished, no acute distress Eyes:  EOMI, conjunctiva normal bilaterally HENT: Normocephalic, atraumatic,mucus membranes moist Respiratory: Normal inspiratory effort Cardiovascular: Normal rate GI: nondistended skin: 10 x 4 cm area of tender blanchable erythema surrounding laceration with some increased temperature.  No induration.  No expressible purulent drainage   Nonblanchable petechiae medial right lower extremity     Marked area of rash with a marker.  Musculoskeletal: Pain with dorsiflexion.  No pain with plantarflexion.  Ankle otherwise nontender.  No erythema of the joint. Neurologic: Alert & oriented x 3, no focal neuro deficits Psychiatric: Speech and behavior appropriate   ED Course   Medications - No data to  display  Orders Placed This Encounter  Procedures   CBC with Differential    Standing Status:   Standing    Number of Occurrences:   1   Results for orders placed or performed during the hospital encounter of 03/21/24  CBC with Differential   Collection Time: 03/21/24  6:02 PM  Result Value Ref Range   WBC 9.1 4.0 - 10.5 K/uL   RBC 4.88 3.87 - 5.11 MIL/uL   Hemoglobin 13.9 12.0 - 15.0 g/dL   HCT 57.8 63.9 - 53.9 %   MCV 86.3 80.0 - 100.0 fL   MCH 28.5 26.0 - 34.0 pg   MCHC 33.0 30.0 - 36.0 g/dL   RDW 86.3 88.4 - 84.4 %   Platelets 238 150 - 400 K/uL   nRBC 0.0 0.0 - 0.2 %   Neutrophils Relative % 71 %   Neutro Abs 6.3 1.7 - 7.7 K/uL   Lymphocytes Relative 17 %   Lymphs Abs 1.6 0.7 - 4.0 K/uL   Monocytes Relative 9 %   Monocytes Absolute 0.8 0.1 - 1.0 K/uL   Eosinophils Relative 3 %   Eosinophils Absolute 0.3 0.0 - 0.5 K/uL   Basophils Relative 0 %   Basophils Absolute 0.0 0.0 - 0.1 K/uL   Immature Granulocytes 0 %   Abs Immature Granulocytes 0.03 0.00 - 0.07 K/uL     No results found for this or any previous visit (from the past 24 hours). No results found.  ED Clinical Impression  1. Cellulitis of right lower extremity   2. Visit for wound check   3. Petechiae      ED Assessment/Plan     Previous records reviewed.  As noted in HPI.  Concern for cellulitis.  She also has some nonblanchable petechiae.  She denies recurrent trauma.  I wonder if this could be a vasculitis.  Will check a CBC to rule out thrombocytopenia.  Denies rash elsewhere.  She states that she has tolerated Keflex  before.  Will send home with Keflex  500 mg 4 times daily for 7 days, triamcinolone  ointment 0.1%.  Follow-up here or with her PCP if she gets worse.  ER return precautions given.  Discussed labs, MDM, treatment plan, and plan for follow-up with patient. Discussed sn/sx that should prompt return to the ED. patient agrees with plan.   Meds ordered this encounter  Medications    cephALEXin  (KEFLEX ) 500 MG capsule    Sig: Take 1 capsule (500 mg total) by mouth 4 (four) times daily for 7 days.    Dispense:  28 capsule    Refill:  0   triamcinolone  ointment (KENALOG ) 0.1 %    Sig: Apply 1 Application topically 2 (two) times daily.    Dispense:  30 g    Refill:  0      *This clinic note was created using Scientist, clinical (histocompatibility and immunogenetics). Therefore, there may be occasional mistakes despite careful proofreading.  ?    Van Knee, MD 03/23/24 [phone]

## 2024-03-21 NOTE — ED Triage Notes (Signed)
 Pt had sutures removed from her right shin 3 days ago. She developed redness around the area today.

## 2024-03-22 ENCOUNTER — Encounter

## 2024-03-24 NOTE — Progress Notes (Signed)
 Pt arrived to clinic for teaching of Dupixent. Pt was given education on the purpose of medication and was informed of potential side effects. Pt was given instruction of how to prep, inject, and dispose of the medication. Pt was able to verify understanding using the teach back method.   Pt was able to safely inject medication into their left and right thighs. No complications noted during injection. Pt stayed one hour after injection for monitoring. No signs of moderate/severe reaction noted during stay.    All questions and concerns appropriately answered during visit. Pt instructed to call the clinic nurse line should they develop any questions or concerns regarding medication.   Nursing Assessment completed.  Drug allergies assessed.   Med supplied: [facility]Patient Supplied   Drug Administered: Dupixent 600mg  (loading dose)  Route: SubQ Frequency: 14 days   Right thigh: 300 mg Lot #: [id number] / Exp 90/30/2027  Left thigh: 300 mg Lot #: [id number] / Exp 90/30/2027  Assessment: Tolerated procedure well, no side effects. Patient has epi pen.  Patient aware to contact office if side effects occur.

## 2024-03-24 NOTE — Progress Notes (Signed)
 [facility] Clinical Assessment & Refill Coordination Note    Brandi Bell, DOB: 11-15-1960  Phone: [phone] (work)    All above HIPAA information was verified with patient.     Was a Nurse, learning disability used for this call? No    Specialty Medication(s):   Inflammatory Disorders: Dupixent      Current Medications[1]     Changes to medications: Merrill reports no changes at this time.    Medication list has been reviewed and updated in Epic: Yes    Allergies[2]    Changes to allergies: No    Allergies have been reviewed and updated in Epic: Yes    SPECIALTY MEDICATION ADHERENCE     Dupixent  300 mg/2mL: 0 doses of medicine on hand     Medication Adherence    Patient reported X missed doses in the last month: 0  Specialty Medication: Dupixent  300 mg/2mL - 600mg  SQ once (loading dose)  Patient is on additional specialty medications: No  Patient is on more than two specialty medications: No  Any gaps in refill history greater than 2 weeks in the last 3 months: no  Demonstrates understanding of importance of adherence: yes  Informant: patient          Specialty medication(s) dose(s) confirmed: Patient reports changes to the regimen as follows: will change to maintenance dose of 300mg  every 14 days     Are there any concerns with adherence? No    Adherence counseling provided? Not needed    CLINICAL MANAGEMENT AND INTERVENTION      Clinical Benefit Assessment:    Do you feel the medicine is effective or helping your condition? No    Clinical Benefit counseling provided? Reasonable expectations discussed: We discussed it can take up to 4 months to determine clinical benefit    Adverse Effects Assessment:    Are you experiencing any side effects? Yes, patient reports experiencing some pain at injection site . See clinical intervention documentation: We discussed using cold compresses before injections or taking tylenol  30 mins before injection     Are you experiencing difficulty administering your medicine? No - received training in clinic    Quality of Life Assessment:    Quality of Life    Rheumatology  Oncology  Dermatology  Cystic Fibrosis          How many days over the past month did your mild intermittent asthma  keep you from your normal activities? For example, brushing your teeth or getting up in the morning. Patient declined to answer    Have you discussed this with your provider? Not needed    Acute Infection Status:    Acute infections noted within Epic:  No active infections    Patient reported infection: None    Therapy Appropriateness:    Is the medication and dose appropriate considering the patient???s diagnosis, treatment, and disease journey, comorbidities, medical history, current medications, allergies, therapeutic goals, self-administration ability, and access barriers? Yes, therapy is appropriate and should be continued     Clinical Intervention:    Was an intervention completed as part of this clinical assessment? No    DISEASE/MEDICATION-SPECIFIC INFORMATION      For patients on injectable medications: Next injection is scheduled for 10/3.    Asthma/COPD: Have you had an asthma exacerbation in the last 30 days? No  Have you needed to use your rescue inhaler more often than usual in the last 30 days? No  Have you needed to take  steroids for your asthma in the last 30 days? Yes, recently finished prednisone  taper    PATIENT SPECIFIC NEEDS     Does the patient have any physical, cognitive, or cultural barriers? No    Is the patient high risk? No    Does the patient require physician intervention or other additional services (i.e., nutrition, smoking cessation, social work)? No    Does the patient have an additional or emergency contact listed in their chart? Yes    SOCIAL DETERMINANTS OF HEALTH     At the [facility], we have learned that life circumstances - like trouble affording food, housing, utilities, or transportation can affect the health of many of our patients.   That is why we wanted to ask: are you currently experiencing any life circumstances that are negatively impacting your health and/or quality of life? Patient declined to answer    Social Drivers of Health     Food Insecurity: Food Insecurity Present (03/18/2024)    Received from [facility]    Hunger Vital Sign     Within the past 12 months, you worried that your food would run out before you got the money to buy more.: Often true     Within the past 12 months, the food you bought just didn't last and you didn't have money to get more.: Often true   Tobacco Use: Low Risk  (03/24/2024)    Patient History     Smoking Tobacco Use: Never     Smokeless Tobacco Use: Never     Passive Exposure: Never   Transportation Needs: No Transportation Needs (03/18/2024)    Received from [facility] - Transportation     In the past 12 months, has lack of transportation kept you from medical appointments or from getting medications?: No     Lack of Transportation (Non-Medical): No   Alcohol Use: Not At Risk (08/31/2023)    Received from [facility]    AUDIT-C     Q1: How often do you have a drink containing alcohol?: Never     Q2: How many drinks containing alcohol do you have on a typical day when you are drinking?: Patient does not drink     Q3: How often do you have six or more drinks on one occasion?: Never   Housing: High Risk (03/18/2024)    Received from [facility]    Housing Stability Vital Sign     In the last 12 months, was there a time when you were not able to pay the mortgage or rent on time?: Yes     In the past 12 months, how many times have you moved where you were living?: 2     At any time in the past 12 months, were you homeless or living in a shelter (including now)?: Yes   Physical Activity: Not on file   Utilities: Not At Risk (03/18/2024)    Received from [facility] Utilities     In the past 12 months has the electric, gas, oil, or water company threatened to shut off services in your home?: No   Stress: Stress Concern Present (03/07/2024)    [employer] of Occupational [employer] - Occupational Stress Questionnaire     Feeling of Stress: Rather much   Interpersonal Safety: Not At Risk (03/03/2024)    Interpersonal Safety     Unsafe Where You  Currently Live: No     Physically Hurt by Anyone: No     Abused by Anyone: No   Substance Use: Low Risk  (12/06/2023)    Substance Use     In the past year, how often have you used prescription drugs for non-medical reasons?: Never     In the past year, how often have you used illegal drugs?: Never     In the past year, have you used any substance for non-medical reasons?: No   Intimate Partner Violence: Not At Risk (02/22/2024)    Received from [facility]    Humiliation, Afraid, Rape, and Kick questionnaire     Within the last year, have you been afraid of your partner or ex-partner?: No     Within the last year, have you been humiliated or emotionally abused in other ways by your partner or ex-partner?: No     Within the last year, have you been kicked, hit, slapped, or otherwise physically hurt by your partner or ex-partner?: No     Within the last year, have you been raped or forced to have any kind of sexual activity by your partner or ex-partner?: No   Social Connections: Unknown (05/21/2023)    Received from [facility]    OASIS D0700: Social Isolation     Frequency of experiencing loneliness or isolation: Patient unable to respond   Financial Resource Strain: High Risk (03/18/2024)    Received from [facility]    Overall Financial Resource Strain (CARDIA)     Difficulty of Paying Living Expenses: Very hard   Health Literacy: Low Risk  (12/23/2023)    Health Literacy     : Never   Internet Connectivity: No Internet connectivity concern identified (12/23/2023)    Internet Connectivity     Do you have access to internet services: Yes     How do you connect to the internet: Personal Device at home     Is your internet connection strong enough for you to watch video on your device without major problems?: Yes     Do you have enough data to get through the month?: Yes     Does at least one of the devices have a camera that you can use for video chat?: Yes       Would you be willing to receive help with any of the needs that you have identified today? Not applicable       SHIPPING     Specialty Medication(s) to be Shipped:   CF/Pulmonary/Asthma: Dupixent     Other medication(s) to be shipped: sharps container    Specialty Medications not needed at this time: N/A     Changes to insurance: No    Cost and Payment: Patient has a $0 copay, payment information is not required.    Delivery Scheduled: Yes, Expected medication delivery date: 03/30/24.     Medication will be delivered via Same Day Courier to the confirmed prescription address in [facility].    The patient will receive a drug information handout for each medication shipped and additional FDA Medication Guides as required.  Verified that patient has previously received a Conservation officer, historic buildings and a Surveyor, mining.    The patient or caregiver noted above participated in the development of this care plan and knows that they can request review of or adjustments to the care plan at any time.      All of the patient's questions and concerns  have been addressed.    Shelba DELENA Hummer, PharmD   [facility] Specialty Pharmacist       [1]   Current Outpatient Medications   Medication Sig Dispense Refill    acetaminophen  (TYLENOL  EXTRA STRENGTH) 500 MG tablet Take 2 tablets (1,000 mg total) by mouth every six (6) hours as needed for pain.      albuterol  2.5 mg /3 mL (0.083 %) nebulizer solution Inhale 3 mL (2.5 mg total) by nebulization every six (6) hours as needed for wheezing.      albuterol  HFA 90 mcg/actuation inhaler Inhale 2 puffs every six (6) hours as needed. 17 g 11    amlodipine  (NORVASC ) 2.5 MG tablet Take 1 tablet (2.5 mg total) by mouth daily. 30 tablet 6    aspirin  (ECOTRIN) 81 MG tablet Take 1 tablet (81 mg total) by mouth daily. 30 tablet 11    buPROPion (WELLBUTRIN SR) 200 MG 12 hr tablet Take 1 tablet (200 mg total) by mouth nightly.      calcium carbonate/vitamin D3 (CALCIUM 600 + D,3, ORAL) Take 2 tablets by mouth daily with evening meal.      cetirizine (ZYRTEC) 5 MG tablet Take 2 tablets (10 mg total) by mouth daily.      covid vaccine 2025-26 (62yrs up)(Moderna)(PF) 10 mcg/0.2 mL syringe Inject 0.2 mL into the muscle once as needed for up to 1 dose. May change mNEXSPIKE to Comirnaty depending on availability;May change mNEXSPIKE to Spikevax depeneding on availability;May change mNEXSPIKE to Nuvaxovid depending on availability 0.2 mL 0    DULoxetine  (CYMBALTA ) 20 MG capsule Take 2 capsules (40 mg total) by mouth daily.      dupilumab  (DUPIXENT  PEN) 300 mg/2 mL pen injector Inject the contents of 1 pen (300 mg total) under the skin every fourteen (14) days. 4 mL 2    EPINEPHrine  (EPIPEN ) 0.3 mg/0.3 mL injection Inject 0.3 mL (0.3 mg total) into the muscle once as needed for anaphylaxis for up to 1 dose. 0.3 mL 0    esomeprazole  (NEXIUM ) 40 MG capsule Take 1 capsule (40 mg total) by mouth daily before breakfast. 90 capsule 3    fluticasone  propion-salmeterol (ADVAIR  HFA) 115-21 mcg/actuation inhaler Inhale 2 puffs two (2) times a day. 12 g 11    fluticasone  propionate (FLONASE ) 50 mcg/actuation nasal spray 2 sprays into each nostril daily.      furosemide  (LASIX ) 20 MG tablet Take 1 tablet (20 mg total) by mouth daily. 100 tablet 3    hydrOXYzine  (VISTARIL ) 25 MG capsule Take 1 capsule by mouth every four (4) hours as needed.      ipratropium-albuterol  (DUO-NEB) 0.5-2.5 mg/3 mL nebulizer Inhale 3 mL by nebulization every six (6) hours as needed. 90 mL 0    levomefolate calcium (L-METHYLFOLATE ORAL) Take 1 tablet by mouth two (2) times a day.      methocarbamol (ROBAXIN) 500 MG tablet Take 2 tablets by mouth daily as needed.      montelukast  (SINGULAIR ) 5 MG chewable tablet Chew 2 tablets (10 mg total) nightly. 180 tablet 3    naloxone  (NARCAN ) 4 mg nasal spray One spray in either nostril once for known/suspected opioid overdose. May repeat every 2-3 minutes in alternating nostril til EMS arrives (Patient not taking: Reported on 03/07/2024) 2 each PRN    nitroglycerin  (NITROSTAT ) 0.4 MG SL tablet Place 1 tablet (0.4 mg total) under the tongue every five (5) minutes as needed for chest pain. Maximum of 3  doses in 15 minutes.      ondansetron  (ZOFRAN -ODT) 4 MG disintegrating tablet Dissolve 1 tablet (4 mg total) in the mouth every eight (8) hours as needed for nausea.      polyethylene glycol (GLYCOLAX ) 17 gram/dose powder Take 17 g by mouth daily. 1700 g 1    polyethylene glycol (MIRALAX ) 17 gram packet Take 17 g by mouth daily. 90 packet 3    predniSONE  (DELTASONE ) 20 MG tablet Take 2 tablets (40 mg total) by mouth daily. For increased shortness of breath, increased cough, or change in sputum. (Patient not taking: Reported on 03/24/2024) 2 tablet 0    pregabalin  (LYRICA ) 200 MG capsule Take 1 capsule (200 mg total) by mouth two (2) times a day. 180 capsule 0    promethazine  (PHENERGAN ) 12.5 MG suppository Insert 1 suppository (12.5 mg total) into the rectum every six (6) hours as needed for nausea.      rOPINIRole (REQUIP) 2 MG tablet Take 1 tablet (2 mg total) by mouth two (2) times a day.      rosuvastatin  (CRESTOR ) 10 MG tablet Take 1 tablet (10 mg total) by mouth daily. 90 tablet 3    senna (SENOKOT) 8.6 mg tablet Take 2 tablets by mouth nightly.      senna-docusate (PERICOLACE) 8.6-50 mg Take 2 tablets by mouth daily. 180 tablet 3    traMADol (ULTRAM) 50 mg tablet Take 4 tablets (200 mg total) by mouth nightly as needed.       No current facility-administered medications for this visit.   [2]   Allergies  Allergen Reactions    Ciprofloxacin Angioedema     SHE CAN TAKE LEVAQUIN. SHE HAD ANGIOEDEMA TO CIPRO  30 YEARS AGO.    Cymbalta  [Duloxetine ] Other (See Comments)     Urinary retention - only at high doses     Imipramine Other (See Comments)     Urinary retention    Oxybutynin Other (See Comments)     Blurred vision        Quetiapine Other (See Comments)    Cat Dander     Cat's Claw     Adhesive Rash     Tegaderm and paper tape are ok.      Adhesive Tape-Silicones Rash     Tegaderm and paper tape are ok    Atorvastatin Muscle Pain    Clindamycin Hives    Diclofenac  Nausea And Vomiting    Haldol  [Haloperidol Lactate] Rash    Haldol [Haloperidol] Rash    Meloxicam  Nausea And Vomiting           Morphine  Nausea And Vomiting    Naproxen  Nausea And Vomiting    Opioids - Morphine  Analogues Nausea Only    Peanut Butter Flavor Nausea And Vomiting     Pt reports she is allergic to peanut butter (vomiting)    Penicillin Hives    Pineapple Rash     Blisters      Pravastatin Muscle Pain    Prochlorperazine Rash and Confusion    Sulfa (Sulfonamide Antibiotics) Hives    Sulfasalazine Hives    Tapentadol Itching    Toradol  [Ketorolac ] Rash     Itchiness and a fine rash - only occurs with PO. Tolerates IV fine.

## 2024-03-26 NOTE — Addendum Note (Signed)
 Addended by: Kert Shackett E on: 03/26/2024 03:31 PM   Modules accepted: Orders

## 2024-03-27 ENCOUNTER — Ambulatory Visit

## 2024-03-27 DIAGNOSIS — R296 Repeated falls: Secondary | ICD-10-CM

## 2024-03-27 DIAGNOSIS — M6281 Muscle weakness (generalized): Secondary | ICD-10-CM

## 2024-03-27 DIAGNOSIS — R262 Difficulty in walking, not elsewhere classified: Secondary | ICD-10-CM

## 2024-03-27 DIAGNOSIS — M5416 Radiculopathy, lumbar region: Secondary | ICD-10-CM

## 2024-03-27 DIAGNOSIS — R5381 Other malaise: Secondary | ICD-10-CM

## 2024-03-27 NOTE — Therapy (Signed)
 OUTPATIENT PHYSICAL THERAPY THORACOLUMBAR Treatment   Patient Name: Brandi Bell MRN: [id number] DOB:Oct 14, 1960, 63 y.o., female Today's Date: 03/27/2024  END OF SESSION:  PT End of Session - 03/27/24 1401     Visit Number 2    Number of Visits 17    Date for Recertification  05/16/24    Authorization Type request 2x/week x 8 weeks (Medicare PN needed at visit #10)    PT Start Time 1415    PT Stop Time 1500    PT Time Calculation (min) 45 min    Activity Tolerance Patient tolerated treatment well    Behavior During Therapy WFL for tasks assessed/performed          Past Medical History:  Diagnosis Date   Anemia    hx of   Anxiety    Arthritis    osteo Back, Feet, Hands   Asthma    [facility] admission - on Bipap - 7/13 - report on paper chart/ flare 3 mos ago   Bipolar disorder (HCC)    Bipolar disorder (HCC)    Bulging lumbar disc    Cancer (HCC)    cervical   Cataract    Depression    Elevated LFTs    having ultrasound [facility] - 12/14/14   GERD (gastroesophageal reflux disease)    vomiting   Headache    migraines, every three or 4 days/ stress related   Heart attack (HCC)    hx of/ Dr. 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, self repaired   History of kidney stones    History of migraine headaches    Hypertension    Echo -1/16 - report on paper chart/controlled on meds   IBS (irritable bowel syndrome)    Microhematuria    Motion sickness    Osteoporosis    Pneumonia    hx of   PONV (postoperative nausea and vomiting)    PTSD (post-traumatic stress disorder)    Restless leg syndrome    Rhinitis    Shortness of breath dyspnea    with exertion   Sleep apnea    central and obstructive/ CPAP   Spinal stenosis of cervical region    Suicide attempt [facility])    Vertigo 6 yrs ago   hx of   Past Surgical History:  Procedure Laterality Date   ANTERIOR AND POSTERIOR REPAIR  2002   done with bladder tact   BILATERAL  SALPINGOOPHORECTOMY  2004   pain from scar tissue   CARDIAC CATHETERIZATION  2008   [facility] - neg - report in paper chart   CATARACT EXTRACTION Bilateral 2007   CHOLECYSTECTOMY     COLONOSCOPY N/A 01/04/2015   Procedure: COLONOSCOPY;  Surgeon: Rogelia Copping, MD;  Location: [facility];  Service: Gastroenterology;  Laterality: N/A;  with biopsies   CYSTOSCOPY W/ RETROGRADES Right 10/26/2016   Procedure: CYSTOSCOPY WITH RETROGRADE PYELOGRAM;  Surgeon: Rosina Riis, MD;  Location: [facility];  Service: Urology;  Laterality: Right;   CYSTOSCOPY WITH STENT PLACEMENT Left 10/26/2016   Procedure: CYSTOSCOPY WITH STENT PLACEMENT;  Surgeon: Rosina Riis, MD;  Location: [facility];  Service: Urology;  Laterality: Left;   ESOPHAGOGASTRODUODENOSCOPY (EGD) WITH PROPOFOL  N/A 12/30/2015   Procedure: ESOPHAGOGASTRODUODENOSCOPY (EGD) WITH PROPOFOL ;  Surgeon: Rogelia Copping, MD;  Location: [facility];  Service: Endoscopy;  Laterality: N/A;   ESOPHAGOGASTRODUODENOSCOPY (EGD) WITH PROPOFOL  N/A 08/12/2017   Procedure: ESOPHAGOGASTRODUODENOSCOPY (EGD) WITH PROPOFOL ;  Surgeon: Copping Rogelia, MD;  Location: [facility];  Service: Endoscopy;  Laterality: N/A;  CPAP   FOOT SURGERY     multiple   SEPTOPLASTY     SHOULDER ARTHROSCOPY Bilateral 2008   TONSILLECTOMY  6   TUBAL LIGATION     URETEROSCOPY WITH HOLMIUM LASER LITHOTRIPSY Left 10/26/2016   Procedure: URETEROSCOPY WITH HOLMIUM LASER LITHOTRIPSY;  Surgeon: Rosina Riis, MD;  Location: [facility];  Service: Urology;  Laterality: Left;   VAGINAL HYSTERECTOMY  1989   cervical cancer age 62   Patient Active Problem List   Diagnosis Date Noted   Nausea and vomiting    OSA (obstructive sleep apnea) 02/08/2017   Vitamin D  deficiency 07/21/2016   Hx of suicide attempt 07/19/2016   PTSD (post-traumatic stress disorder) 07/19/2016   Migraine headache 07/19/2016   RLS (restless legs syndrome) 07/01/2016   Osteoporosis 07/01/2016   Trochanteric bursitis  03/17/2016   Prediabetes 03/17/2016   Gastritis    Pituitary adenoma (HCC) 10/02/2015   Borderline personality disorder (HCC) 02/10/2015   Chronic pain syndrome 02/10/2015   Arthritis 02/10/2015   Hx of colonic polyps    Seasonal allergies 12/21/2013   Fibromyalgia 12/21/2013   Moderate persistent asthma without complication 06/20/2013   Chronic nausea 06/20/2013   Bipolar affective disorder (HCC) 06/20/2013   Hx of cervical cancer 07/20/2012   Lumbar herniated disc 07/20/2012   Hyperlipidemia 07/20/2012   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 Dann, MD  REFERRING PROVIDER: Dr. Jesus  REFERRING DIAG:  Diagnosis  M54.31 (ICD-10-CM) - Sciatica, right side  M50.30 (ICD-10-CM) - Other cervical disc degeneration, unspecified cervical region  Z91.81 (ICD-10-CM) - History of falling    Rationale for Evaluation and Treatment: Rehabilitation  THERAPY DIAG:  Muscle weakness (generalized)  Physical deconditioning  Repeated falls  Difficulty in walking, not elsewhere classified  Radiculopathy, lumbar region  ONSET DATE: August 2025  SUBJECTIVE:                                                                                                                                                                                           SUBJECTIVE STATEMENT: Pt's main concern is she feels weak after multiple health concerns this year and multiple hospital stays (most recent in Aug 2025 for respiratory issues; per char review- recently returned to the hospital due to acute hypoxemic respiratory failure with worsening dyspnea and asthma exacerbation. )  Just had a course of home health PT- finished before outpatient PT   Started medications for breathing- those are helping; starts injection therapy this Friday (Dupixent)  Walking with a SPC, sometimes walker, and sometimes no  cane.   Most recently had a fall 2 weeks ago- physical fatigue she feels  like contributes to it  Difficulty standing stationary (bothers lower back)  Does some walking 100 m driveway, 4x wears a mask for this, feels SOB sometimes    PERTINENT HISTORY:  Just finished home health PT Was hospitalized for respiratory failure in August 2025 Has h/o low back pain- L3-4  Has had shoulder surgery b/l  PAIN:  Are you having pain? Currently 0/10 and at worst 9-10/10  PRECAUTIONS: Fall  RED FLAGS: None   WEIGHT BEARING RESTRICTIONS: No  FALLS:  Has patient fallen in last 6 months? Yes. Number of falls >3  LIVING ENVIRONMENT: Lives with: lives alone Lives in: Mobile home Stairs: 6 steps to enter/exit Has following equipment at home: Single point cane  Has family/sister support nearby where she lives   OCCUPATION: retired; enjoys Environmental manager, does puzzles  PLOF: Independent  PATIENT GOALS: to be able to walk, able to hike, able to walk at [employer] (beach trip planned for October 2025), able to travel again  NEXT MD VISIT: End of September   OBJECTIVE:  Note: Objective measures were completed at Evaluation unless otherwise noted.  DIAGNOSTIC FINDINGS: See chart review  PATIENT SURVEYS:  LEFS 40/80  COGNITION: Overall cognitive status: Within functional limits for tasks assessed     SENSATION: WFL   POSTURE: rounded shoulders   LUMBAR ROM: deferred  AROM eval  Flexion   Extension   Right lateral flexion   Left lateral flexion   Right rotation   Left rotation    (Blank rows = not tested)  LOWER EXTREMITY ROM:   deferred; pt able to transfer sit to stand  Active  Right eval Left eval  Hip flexion    Hip extension    Hip abduction    Hip adduction    Hip internal rotation    Hip external rotation    Knee flexion    Knee extension    Ankle dorsiflexion    Ankle plantarflexion    Ankle inversion    Ankle eversion     (Blank rows = not tested)  LOWER EXTREMITY MMT:    MMT Right eval Left eval  Hip flexion 4 4   Hip extension    Hip abduction    Hip adduction    Hip internal rotation    Hip external rotation    Knee flexion 4 4  Knee extension 4 4  Ankle dorsiflexion 4 4  Ankle plantarflexion    Ankle inversion    Ankle eversion     (Blank rows = not tested)   FUNCTIONAL TESTS:  5 times sit to stand: 9 seconds today   SLS: 3-5 seconds each LE   TUG 11 seconds today GAIT: Pt amb with SPC in clinic today  TREATMENT DATE: 03/27/24 Subjective:  Pt has been trying to walk some without her cane this week.  She has not fallen, but does feel a bit fearful with her balance.  3rd week of October she is heading to [place], [place] for a mountain trip.    Objective: Pain 0/10 Therapeutic Exercise: Nustep level 3-4 x 10 minutes, seat #9  6 min walk test: able to complete 3:45 minutes (8x85ft total=560 ft) then needed a sitting break due to R hip mm fatigue; test ended  Therapeutic Activities: Squats with chair behind: x 10 Front squats: 7 # weight, 2x8 Goblet squats: weight to/from 6 inch box x 10 SL balance:  R ane L LE x 10-12 second intervals in parallel bars Lateral stepping along parallel bar with blue TB at thighs x 6 laps Front step up intervals: 30 seconds on 6 inch step, x3  PATIENT EDUCATION:  Education details: HEP, PT POC/goals Person educated: Patient Education method: Explanation Education comprehension: verbalized understanding  HOME EXERCISE PROGRAM: To be formally initiated at visit #2, encouraged pt to continue with current HEP and add front step up intervals to HEP, 3x 30 sec after her walks for LE strength/standing endurance  ASSESSMENT:  CLINICAL IMPRESSION: Patient is a 63 y.o. F who was seen today for physical therapy treatment for a course of outpatient PT after recently being hospitalized and completing her course of home health PT.  She is very motivated to participate in PT.  Vital signs for SpO2 remained >95% throughout session.  Pt's 6 min walk test was  stopped at 3:45 due to fatigue in R hip mm and pt developing significant antalgic gait which contributed to unsteadiness on feet.  She reports feeling discouraged by this.  Discussed benefits of PT and how PT can likely help improve strength, endurance, and activity tolerance.  Presents with deconditioning, muscle weakness, h/o lumbar radiculopathy, history of recent falls, decreased balance, and decreased activity tolerance.  She is an appropriate candidate for outpatient PT to address impairments below and to facilitate improved activity tolerance, improved safety during amb, and improved QOL.   OBJECTIVE IMPAIRMENTS: decreased activity tolerance, decreased balance, difficulty walking, decreased strength, impaired perceived functional ability, and pain.   ACTIVITY LIMITATIONS: standing, squatting, stairs, transfers, and locomotion level  PARTICIPATION LIMITATIONS: meal prep, cleaning, laundry, shopping, community activity, and yard work  PERSONAL FACTORS: Past/current experiences, Time since onset of injury/illness/exacerbation, and 1-2 comorbidities: see PMH are also affecting patient's functional outcome.   REHAB POTENTIAL: Good  CLINICAL DECISION MAKING: Evolving/moderate complexity  EVALUATION COMPLEXITY: Moderate   GOALS: Goals reviewed with patient? Yes  SHORT TERM GOALS: Target date: 04/05/24  Complete 6 min walk test to establish baseline endurance for amb  Baseline:at visit #2 Goal status: INITIAL   LONG TERM GOALS: Target date: 05/16/24  Improve LEFS >10 points indicating pt is less significantly limited in her ability to perform her daily activities Baseline: 40 Goal status: INITIAL  2.  Pt will be able to amb on even/uneven surfaces x 15 min without AD to be able to go on her beach trip and walk to/from beach at end of October Baseline: SPC used for amb on flat surfaces Goal status: INITIAL  3.  Pt will be able to perform >20 min consecutive standing housechores to  complete her housework/meal prep/laundry without being limited by fatigue/SOB Baseline: 10 min intervals Goal status: INITIAL    PLAN:  PT FREQUENCY: 2x/week  PT DURATION: 8 weeks  PLANNED INTERVENTIONS: 97110-Therapeutic exercises, 97530- Therapeutic activity, [id number]- Neuromuscular re-education, 97535- Self Care, [id number]- Manual therapy, and Patient/Family education.  PLAN FOR NEXT SESSION: continue with tx emphasizing endurance/conditioning, LE strengthening, stair intervals  Vernell Reges, PT, DPT, OCS  Almee Pelphrey E Sparrow Sanzo, PT 03/27/2024, 2:01 PM

## 2024-03-29 ENCOUNTER — Ambulatory Visit

## 2024-04-05 ENCOUNTER — Ambulatory Visit

## 2024-04-10 ENCOUNTER — Ambulatory Visit: Attending: Internal Medicine

## 2024-04-10 DIAGNOSIS — M6281 Muscle weakness (generalized): Secondary | ICD-10-CM | POA: Diagnosis present

## 2024-04-10 DIAGNOSIS — R262 Difficulty in walking, not elsewhere classified: Secondary | ICD-10-CM | POA: Diagnosis present

## 2024-04-10 DIAGNOSIS — R296 Repeated falls: Secondary | ICD-10-CM | POA: Insufficient documentation

## 2024-04-10 DIAGNOSIS — M5416 Radiculopathy, lumbar region: Secondary | ICD-10-CM | POA: Diagnosis present

## 2024-04-10 DIAGNOSIS — R5381 Other malaise: Secondary | ICD-10-CM | POA: Diagnosis present

## 2024-04-10 NOTE — Therapy (Signed)
 OUTPATIENT PHYSICAL THERAPY THORACOLUMBAR Treatment   Patient Name: Brandi Bell MRN: [id number] DOB:Aug 11, 1960, 63 y.o., female Today's Date: 04/10/2024  END OF SESSION:  PT End of Session - 04/10/24 0902     Visit Number 3    Number of Visits 17    Date for Recertification  05/16/24    Authorization Type request 2x/week x 8 weeks (Medicare PN needed at visit #10)    PT Start Time 0900    PT Stop Time 0945    PT Time Calculation (min) 45 min    Activity Tolerance Patient tolerated treatment well    Behavior During Therapy WFL for tasks assessed/performed          Past Medical History:  Diagnosis Date   Anemia    hx of   Anxiety    Arthritis    osteo Back, Feet, Hands   Asthma    [facility] admission - on Bipap - 7/13 - report on paper chart/ flare 3 mos ago   Bipolar disorder (HCC)    Bipolar disorder (HCC)    Bulging lumbar disc    Cancer (HCC)    cervical   Cataract    Depression    Elevated LFTs    having ultrasound [facility] - 12/14/14   GERD (gastroesophageal reflux disease)    vomiting   Headache    migraines, every three or 4 days/ stress related   Heart attack (HCC)    hx of/ Dr. 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, self repaired   History of kidney stones    History of migraine headaches    Hypertension    Echo -1/16 - report on paper chart/controlled on meds   IBS (irritable bowel syndrome)    Microhematuria    Motion sickness    Osteoporosis    Pneumonia    hx of   PONV (postoperative nausea and vomiting)    PTSD (post-traumatic stress disorder)    Restless leg syndrome    Rhinitis    Shortness of breath dyspnea    with exertion   Sleep apnea    central and obstructive/ CPAP   Spinal stenosis of cervical region    Suicide attempt [facility])    Vertigo 6 yrs ago   hx of   Past Surgical History:  Procedure Laterality Date   ANTERIOR AND POSTERIOR REPAIR  2002   done with bladder tact   BILATERAL  SALPINGOOPHORECTOMY  2004   pain from scar tissue   CARDIAC CATHETERIZATION  2008   [facility] - neg - report in paper chart   CATARACT EXTRACTION Bilateral 2007   CHOLECYSTECTOMY     COLONOSCOPY N/A 01/04/2015   Procedure: COLONOSCOPY;  Surgeon: Rogelia Copping, MD;  Location: [facility];  Service: Gastroenterology;  Laterality: N/A;  with biopsies   CYSTOSCOPY W/ RETROGRADES Right 10/26/2016   Procedure: CYSTOSCOPY WITH RETROGRADE PYELOGRAM;  Surgeon: Rosina Riis, MD;  Location: [facility];  Service: Urology;  Laterality: Right;   CYSTOSCOPY WITH STENT PLACEMENT Left 10/26/2016   Procedure: CYSTOSCOPY WITH STENT PLACEMENT;  Surgeon: Rosina Riis, MD;  Location: [facility];  Service: Urology;  Laterality: Left;   ESOPHAGOGASTRODUODENOSCOPY (EGD) WITH PROPOFOL  N/A 12/30/2015   Procedure: ESOPHAGOGASTRODUODENOSCOPY (EGD) WITH PROPOFOL ;  Surgeon: Rogelia Copping, MD;  Location: [facility];  Service: Endoscopy;  Laterality: N/A;   ESOPHAGOGASTRODUODENOSCOPY (EGD) WITH PROPOFOL  N/A 08/12/2017   Procedure: ESOPHAGOGASTRODUODENOSCOPY (EGD) WITH PROPOFOL ;  Surgeon: Copping Rogelia, MD;  Location: [facility];  Service: Endoscopy;  Laterality: N/A;  CPAP   FOOT SURGERY     multiple   SEPTOPLASTY     SHOULDER ARTHROSCOPY Bilateral 2008   TONSILLECTOMY  6   TUBAL LIGATION     URETEROSCOPY WITH HOLMIUM LASER LITHOTRIPSY Left 10/26/2016   Procedure: URETEROSCOPY WITH HOLMIUM LASER LITHOTRIPSY;  Surgeon: Rosina Riis, MD;  Location: [facility];  Service: Urology;  Laterality: Left;   VAGINAL HYSTERECTOMY  1989   cervical cancer age 29   Patient Active Problem List   Diagnosis Date Noted   Nausea and vomiting    OSA (obstructive sleep apnea) 02/08/2017   Vitamin D  deficiency 07/21/2016   Hx of suicide attempt 07/19/2016   PTSD (post-traumatic stress disorder) 07/19/2016   Migraine headache 07/19/2016   RLS (restless legs syndrome) 07/01/2016   Osteoporosis 07/01/2016   Trochanteric bursitis  03/17/2016   Prediabetes 03/17/2016   Gastritis    Pituitary adenoma (HCC) 10/02/2015   Borderline personality disorder (HCC) 02/10/2015   Chronic pain syndrome 02/10/2015   Arthritis 02/10/2015   Hx of colonic polyps    Seasonal allergies 12/21/2013   Fibromyalgia 12/21/2013   Moderate persistent asthma without complication 06/20/2013   Chronic nausea 06/20/2013   Bipolar affective disorder (HCC) 06/20/2013   Hx of cervical cancer 07/20/2012   Lumbar herniated disc 07/20/2012   Hyperlipidemia 07/20/2012   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 Dann, MD  REFERRING PROVIDER: Dr. Jesus  REFERRING DIAG:  Diagnosis  M54.31 (ICD-10-CM) - Sciatica, right side  M50.30 (ICD-10-CM) - Other cervical disc degeneration, unspecified cervical region  Z91.81 (ICD-10-CM) - History of falling    Rationale for Evaluation and Treatment: Rehabilitation  THERAPY DIAG:  Muscle weakness (generalized)  Physical deconditioning  Repeated falls  Difficulty in walking, not elsewhere classified  Radiculopathy, lumbar region  ONSET DATE: August 2025  SUBJECTIVE:                                                                                                                                                                                           SUBJECTIVE STATEMENT: Pt's main concern is she feels weak after multiple health concerns this year and multiple hospital stays (most recent in Aug 2025 for respiratory issues; per char review- recently returned to the hospital due to acute hypoxemic respiratory failure with worsening dyspnea and asthma exacerbation. )  Just had a course of home health PT- finished before outpatient PT   Started medications for breathing- those are helping; starts injection therapy this Friday (Dupixent)  Walking with a SPC, sometimes walker, and sometimes no  cane.   Most recently had a fall 2 weeks ago- physical fatigue she feels  like contributes to it  Difficulty standing stationary (bothers lower back)  Does some walking 100 m driveway, 4x wears a mask for this, feels SOB sometimes    PERTINENT HISTORY:  Just finished home health PT Was hospitalized for respiratory failure in August 2025 Has h/o low back pain- L3-4  Has had shoulder surgery b/l  PAIN:  Are you having pain? Currently 0/10 and at worst 9-10/10  PRECAUTIONS: Fall  RED FLAGS: None   WEIGHT BEARING RESTRICTIONS: No  FALLS:  Has patient fallen in last 6 months? Yes. Number of falls >3  LIVING ENVIRONMENT: Lives with: lives alone Lives in: Mobile home Stairs: 6 steps to enter/exit Has following equipment at home: Single point cane  Has family/sister support nearby where she lives   OCCUPATION: retired; enjoys Environmental manager, does puzzles  PLOF: Independent  PATIENT GOALS: to be able to walk, able to hike, able to walk at [employer] (beach trip planned for October 2025), able to travel again  NEXT MD VISIT: End of September   OBJECTIVE:  Note: Objective measures were completed at Evaluation unless otherwise noted.  DIAGNOSTIC FINDINGS: See chart review  PATIENT SURVEYS:  LEFS 40/80  COGNITION: Overall cognitive status: Within functional limits for tasks assessed     SENSATION: WFL   POSTURE: rounded shoulders   LUMBAR ROM: deferred  AROM eval  Flexion   Extension   Right lateral flexion   Left lateral flexion   Right rotation   Left rotation    (Blank rows = not tested)  LOWER EXTREMITY ROM:   deferred; pt able to transfer sit to stand  Active  Right eval Left eval  Hip flexion    Hip extension    Hip abduction    Hip adduction    Hip internal rotation    Hip external rotation    Knee flexion    Knee extension    Ankle dorsiflexion    Ankle plantarflexion    Ankle inversion    Ankle eversion     (Blank rows = not tested)  LOWER EXTREMITY MMT:    MMT Right eval Left eval  Hip flexion 4 4   Hip extension    Hip abduction    Hip adduction    Hip internal rotation    Hip external rotation    Knee flexion 4 4  Knee extension 4 4  Ankle dorsiflexion 4 4  Ankle plantarflexion    Ankle inversion    Ankle eversion     (Blank rows = not tested)   FUNCTIONAL TESTS:  5 times sit to stand: 9 seconds today   SLS: 3-5 seconds each LE   TUG 11 seconds today GAIT: Pt amb with SPC in clinic today  TREATMENT DATE: 04/10/24 Subjective:  Pt has been feeling very sick this week- stomach virus, also having R ankle pain- f/u with ortho later this week for MRI of R ankle.  Her lower back is sore/stiff today.  She expresses feeling frustrated at feeling like being sick is a step backwards for her strength/stamina.  Objective: Pain 0/10 Therapeutic Exercise: Nustep level 3-4 x 10 minutes, seat #9- not today SKTC x 5 with PT ea Hooklying LTR x 5 ea SLR with PT assist x 10 ea Seated marches x 10 ea  Therapeutic Activities: Squats with chair behind: x 10 Sit to stand x 5, 2 sets Heel raises x 10,  2 sets Vitals assessed: HR 78 bpm, O2 sat 98% during session  Not today: Front squats: 7 # weight, 2x8- not today Goblet squats: weight to/from 6 inch box x 10- not today SL balance: R ane L LE x 10-12 second intervals in parallel bars Lateral stepping along parallel bar with blue TB at thighs x 6 laps- not today Front step up intervals: 30 seconds on 6 inch step, x3- not today 6 min walk test: able to complete 3:45 minutes (8x37ft total=560 ft) then needed a sitting break due to R hip mm fatigue; test ended- not today   PATIENT EDUCATION:  Education details: HEP, PT POC/goals Person educated: Patient Education method: Explanation Education comprehension: verbalized understanding  HOME EXERCISE PROGRAM: To be formally initiated at visit #2, encouraged pt to continue with current HEP and add front step up intervals to HEP, 3x 30 sec after her walks for LE strength/standing  endurance  ASSESSMENT:  CLINICAL IMPRESSION: Patient arrived with decreased activity tolerance since last session after illness at home last week.  She expresses frustration with her decreased activity tolerance as she felt like she was beginning to make progress with PT. Discussed benefits of PT and how PT can likely help improve strength, endurance, and activity tolerance.  Adjusted intensity of today's session accordingly with reduced intensity.  Pt also required a brief sitting break during session.  Presents with deconditioning, muscle weakness, h/o lumbar radiculopathy, history of recent falls, decreased balance, and decreased activity tolerance.  She is an appropriate candidate for outpatient PT to address impairments below and to facilitate improved activity tolerance, improved safety during amb, and improved QOL.   OBJECTIVE IMPAIRMENTS: decreased activity tolerance, decreased balance, difficulty walking, decreased strength, impaired perceived functional ability, and pain.   ACTIVITY LIMITATIONS: standing, squatting, stairs, transfers, and locomotion level  PARTICIPATION LIMITATIONS: meal prep, cleaning, laundry, shopping, community activity, and yard work  PERSONAL FACTORS: Past/current experiences, Time since onset of injury/illness/exacerbation, and 1-2 comorbidities: see PMH are also affecting patient's functional outcome.   REHAB POTENTIAL: Good  CLINICAL DECISION MAKING: Evolving/moderate complexity  EVALUATION COMPLEXITY: Moderate   GOALS: Goals reviewed with patient? Yes  SHORT TERM GOALS: Target date: 04/05/24  Complete 6 min walk test to establish baseline endurance for amb  Baseline:at visit #2 Goal status: INITIAL   LONG TERM GOALS: Target date: 05/16/24  Improve LEFS >10 points indicating pt is less significantly limited in her ability to perform her daily activities Baseline: 40 Goal status: INITIAL  2.  Pt will be able to amb on even/uneven surfaces x 15  min without AD to be able to go on her beach trip and walk to/from beach at end of October Baseline: SPC used for amb on flat surfaces Goal status: INITIAL  3.  Pt will be able to perform >20 min consecutive standing housechores to complete her housework/meal prep/laundry without being limited by fatigue/SOB Baseline: 10 min intervals Goal status: INITIAL    PLAN:  PT FREQUENCY: 2x/week  PT DURATION: 8 weeks  PLANNED INTERVENTIONS: 97110-Therapeutic exercises, 97530- Therapeutic activity, [id number]- Neuromuscular re-education, 97535- Self Care, [id number]- Manual therapy, and Patient/Family education.  PLAN FOR NEXT SESSION: continue with tx emphasizing endurance/conditioning, LE strengthening, stair intervals  Vernell Reges, PT, DPT, OCS  Mollie Rossano E Francyne Arreaga, PT 04/10/2024, 11:25 AM

## 2024-04-12 ENCOUNTER — Ambulatory Visit

## 2024-04-17 ENCOUNTER — Ambulatory Visit

## 2024-04-19 ENCOUNTER — Ambulatory Visit

## 2024-04-24 ENCOUNTER — Ambulatory Visit

## 2024-04-24 DIAGNOSIS — M5416 Radiculopathy, lumbar region: Secondary | ICD-10-CM

## 2024-04-24 DIAGNOSIS — M6281 Muscle weakness (generalized): Secondary | ICD-10-CM | POA: Diagnosis not present

## 2024-04-24 DIAGNOSIS — R296 Repeated falls: Secondary | ICD-10-CM

## 2024-04-24 DIAGNOSIS — R262 Difficulty in walking, not elsewhere classified: Secondary | ICD-10-CM

## 2024-04-24 DIAGNOSIS — R5381 Other malaise: Secondary | ICD-10-CM

## 2024-04-24 NOTE — Therapy (Signed)
 OUTPATIENT PHYSICAL THERAPY THORACOLUMBAR Treatment   Patient Name: Brandi Bell MRN: [id number] DOB:[birth year], 63 y.o., female Today's Date: 04/24/2024  END OF SESSION:  PT End of Session - 04/24/24 0901     Visit Number 4    Number of Visits 17    Date for Recertification  05/16/24    Authorization Type request 2x/week x 8 weeks (Medicare PN needed at visit #10)    PT Start Time 0900    PT Stop Time 0945    PT Time Calculation (min) 45 min    Activity Tolerance Patient tolerated treatment well    Behavior During Therapy WFL for tasks assessed/performed          Past Medical History:  Diagnosis Date   Anemia    hx of   Anxiety    Arthritis    osteo Back, Feet, Hands   Asthma    [facility] admission - on Bipap - 7/13 - report on paper chart/ flare 3 mos ago   Bipolar disorder (HCC)    Bipolar disorder (HCC)    Bulging lumbar disc    Cancer (HCC)    cervical   Cataract    Depression    Elevated LFTs    having ultrasound [facility] - 12/14/14   GERD (gastroesophageal reflux disease)    vomiting   Headache    migraines, every three or 4 days/ stress related   Heart attack (HCC)    hx of/ Dr. 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, self repaired   History of kidney stones    History of migraine headaches    Hypertension    Echo -1/16 - report on paper chart/controlled on meds   IBS (irritable bowel syndrome)    Microhematuria    Motion sickness    Osteoporosis    Pneumonia    hx of   PONV (postoperative nausea and vomiting)    PTSD (post-traumatic stress disorder)    Restless leg syndrome    Rhinitis    Shortness of breath dyspnea    with exertion   Sleep apnea    central and obstructive/ CPAP   Spinal stenosis of cervical region    Suicide attempt [facility])    Vertigo 6 yrs ago   hx of   Past Surgical History:  Procedure Laterality Date   ANTERIOR AND POSTERIOR REPAIR  2002   done with bladder tact   BILATERAL  SALPINGOOPHORECTOMY  2004   pain from scar tissue   CARDIAC CATHETERIZATION  2008   [facility] - neg - report in paper chart   CATARACT EXTRACTION Bilateral 2007   CHOLECYSTECTOMY     COLONOSCOPY N/A 01/04/2015   Procedure: COLONOSCOPY;  Surgeon: Rogelia Copping, MD;  Location: [facility];  Service: Gastroenterology;  Laterality: N/A;  with biopsies   CYSTOSCOPY W/ RETROGRADES Right 10/26/2016   Procedure: CYSTOSCOPY WITH RETROGRADE PYELOGRAM;  Surgeon: Rosina Riis, MD;  Location: [facility];  Service: Urology;  Laterality: Right;   CYSTOSCOPY WITH STENT PLACEMENT Left 10/26/2016   Procedure: CYSTOSCOPY WITH STENT PLACEMENT;  Surgeon: Rosina Riis, MD;  Location: [facility];  Service: Urology;  Laterality: Left;   ESOPHAGOGASTRODUODENOSCOPY (EGD) WITH PROPOFOL  N/A 12/30/2015   Procedure: ESOPHAGOGASTRODUODENOSCOPY (EGD) WITH PROPOFOL ;  Surgeon: Rogelia Copping, MD;  Location: [facility];  Service: Endoscopy;  Laterality: N/A;   ESOPHAGOGASTRODUODENOSCOPY (EGD) WITH PROPOFOL  N/A 08/12/2017   Procedure: ESOPHAGOGASTRODUODENOSCOPY (EGD) WITH PROPOFOL ;  Surgeon: Copping Rogelia, MD;  Location: [facility];  Service: Endoscopy;  Laterality: N/A;  CPAP   FOOT SURGERY     multiple   SEPTOPLASTY     SHOULDER ARTHROSCOPY Bilateral 2008   TONSILLECTOMY  6   TUBAL LIGATION     URETEROSCOPY WITH HOLMIUM LASER LITHOTRIPSY Left 10/26/2016   Procedure: URETEROSCOPY WITH HOLMIUM LASER LITHOTRIPSY;  Surgeon: Rosina Riis, MD;  Location: [facility];  Service: Urology;  Laterality: Left;   VAGINAL HYSTERECTOMY  1989   cervical cancer age 41   Patient Active Problem List   Diagnosis Date Noted   Nausea and vomiting    OSA (obstructive sleep apnea) 02/08/2017   Vitamin D  deficiency 07/21/2016   Hx of suicide attempt 07/19/2016   PTSD (post-traumatic stress disorder) 07/19/2016   Migraine headache 07/19/2016   RLS (restless legs syndrome) 07/01/2016   Osteoporosis 07/01/2016   Trochanteric bursitis  03/17/2016   Prediabetes 03/17/2016   Gastritis    Pituitary adenoma (HCC) 10/02/2015   Borderline personality disorder (HCC) 02/10/2015   Chronic pain syndrome 02/10/2015   Arthritis 02/10/2015   Hx of colonic polyps    Seasonal allergies 12/21/2013   Fibromyalgia 12/21/2013   Moderate persistent asthma without complication 06/20/2013   Chronic nausea 06/20/2013   Bipolar affective disorder (HCC) 06/20/2013   Hx of cervical cancer 07/20/2012   Lumbar herniated disc 07/20/2012   Hyperlipidemia 07/20/2012   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 Dann, MD  REFERRING PROVIDER: Dr. Jesus  REFERRING DIAG:  Diagnosis  M54.31 (ICD-10-CM) - Sciatica, right side  M50.30 (ICD-10-CM) - Other cervical disc degeneration, unspecified cervical region  Z91.81 (ICD-10-CM) - History of falling    Rationale for Evaluation and Treatment: Rehabilitation  THERAPY DIAG:  Muscle weakness (generalized)  Physical deconditioning  Repeated falls  Difficulty in walking, not elsewhere classified  Radiculopathy, lumbar region  ONSET DATE: August 2025  SUBJECTIVE:                                                                                                                                                                                           SUBJECTIVE STATEMENT: Pt's main concern is she feels weak after multiple health concerns this year and multiple hospital stays (most recent in Aug 2025 for respiratory issues; per char review- recently returned to the hospital due to acute hypoxemic respiratory failure with worsening dyspnea and asthma exacerbation. )  Just had a course of home health PT- finished before outpatient PT   Started medications for breathing- those are helping; starts injection therapy this Friday (Dupixent)  Walking with a SPC, sometimes walker, and sometimes no  cane.   Most recently had a fall 2 weeks ago- physical fatigue she feels  like contributes to it  Difficulty standing stationary (bothers lower back)  Does some walking 100 m driveway, 4x wears a mask for this, feels SOB sometimes    PERTINENT HISTORY:  Just finished home health PT Was hospitalized for respiratory failure in August 2025 Has h/o low back pain- L3-4  Has had shoulder surgery b/l  PAIN:  Are you having pain? Currently 0/10 and at worst 9-10/10  PRECAUTIONS: Fall  RED FLAGS: None   WEIGHT BEARING RESTRICTIONS: No  FALLS:  Has patient fallen in last 6 months? Yes. Number of falls >3  LIVING ENVIRONMENT: Lives with: lives alone Lives in: Mobile home Stairs: 6 steps to enter/exit Has following equipment at home: Single point cane  Has family/sister support nearby where she lives   OCCUPATION: retired; enjoys Environmental manager, does puzzles  PLOF: Independent  PATIENT GOALS: to be able to walk, able to hike, able to walk at [employer] (beach trip planned for October 2025), able to travel again  NEXT MD VISIT: End of September   OBJECTIVE:  Note: Objective measures were completed at Evaluation unless otherwise noted.  DIAGNOSTIC FINDINGS: See chart review  PATIENT SURVEYS:  LEFS 40/80  COGNITION: Overall cognitive status: Within functional limits for tasks assessed     SENSATION: WFL   POSTURE: rounded shoulders   LUMBAR ROM: deferred  AROM eval  Flexion   Extension   Right lateral flexion   Left lateral flexion   Right rotation   Left rotation    (Blank rows = not tested)  LOWER EXTREMITY ROM:   deferred; pt able to transfer sit to stand  Active  Right eval Left eval  Hip flexion    Hip extension    Hip abduction    Hip adduction    Hip internal rotation    Hip external rotation    Knee flexion    Knee extension    Ankle dorsiflexion    Ankle plantarflexion    Ankle inversion    Ankle eversion     (Blank rows = not tested)  LOWER EXTREMITY MMT:    MMT Right eval Left eval  Hip flexion 4 4   Hip extension    Hip abduction    Hip adduction    Hip internal rotation    Hip external rotation    Knee flexion 4 4  Knee extension 4 4  Ankle dorsiflexion 4 4  Ankle plantarflexion    Ankle inversion    Ankle eversion     (Blank rows = not tested)   FUNCTIONAL TESTS:  5 times sit to stand: 9 seconds today   SLS: 3-5 seconds each LE   TUG 11 seconds today GAIT: Pt amb with SPC in clinic today  TREATMENT DATE: 04/24/24 Subjective:  Pt was recently in hospital for 2 days due to cellulitis in R lower leg.  Infection has resolved; saw orthopedics for consult though bc she is sore along anterior tendon and when bending her foot down.  Going to get a foot brace and also referred for lymphedma consult.  Feels like the recent hospitalization has resulted in decreased activity tolerance.  She is trying to do some of her HEP since being home.  Using [facility] for amb.    Objective: Pain 0/10  Vital signs today 142/75 BP HR 75 SpO2 98%  Therapeutic Exercise: Nustep level 3-4 x 10 minutes, seat #9- not today  SKTC x 5 with PT ea- not today Hooklying LTR x 5 ea- not today SLR with PT assist x 10 ea- not today Seated marches x 10 ea LAQ: 5# 2x10 ea Hip abd with blue TB: 5 second holds x 20 Hip add with ball (sitting): 5 second holds x 20  Therapeutic Activities: Squats with chair behind: x 10, 2 sets Sit to stand x 5, 2 sets- not today Heel raises x 10, 2 sets- not today Standing hamstring curls: 5# 2x10 ea Lateral stepping along railing x 4 laps with 5# ankle weights Tandem stance R/L 20 second intervals x 4 ea (pt reports when R foot is posterior this is sore in her ankle) Amb in hallway: with SPC, 3 laps, 1 brief 1 min rest break, walking endurance ~2 minutes before rest break  Not today: Front squats: 7 # weight, 2x8- not today Goblet squats: weight to/from 6 inch box x 10- not today SL balance: R and L LE x 10-12 second intervals in parallel bars- not today Front step up  intervals: 30 seconds on 6 inch step, x3- not today 6 min walk test: able to complete 3:45 minutes (8x28ft total=560 ft) then needed a sitting break due to R hip mm fatigue; test ended- not today   PATIENT EDUCATION:  Education details: HEP, PT POC/goals Person educated: Patient Education method: Explanation Education comprehension: verbalized understanding  HOME EXERCISE PROGRAM: To be formally initiated at visit #2, encouraged pt to continue with current HEP and add front step up intervals to HEP, 3x 30 sec after her walks for LE strength/standing endurance  ASSESSMENT:  CLINICAL IMPRESSION: Patient arrived motivated to participate in today's session.  She was recently hospitalized for 2 days and is now back home.  Amb with SPC today.  Vital signs were within safe range for exercise today; deconditioning is present.  She required sitting rest break after 2 min intervals of walking with SPC.  Continued with therapeutic exercises and activities to address strength/endurance/balance impairments and she was able to perform without c/o increased pain.  Presents with deconditioning, muscle weakness, h/o lumbar radiculopathy, history of recent falls, decreased balance, and decreased activity tolerance.  She is an appropriate candidate for outpatient PT to address impairments below and to facilitate improved activity tolerance, improved safety during amb, and improved QOL.   OBJECTIVE IMPAIRMENTS: decreased activity tolerance, decreased balance, difficulty walking, decreased strength, impaired perceived functional ability, and pain.   ACTIVITY LIMITATIONS: standing, squatting, stairs, transfers, and locomotion level  PARTICIPATION LIMITATIONS: meal prep, cleaning, laundry, shopping, community activity, and yard work  PERSONAL FACTORS: Past/current experiences, Time since onset of injury/illness/exacerbation, and 1-2 comorbidities: see PMH are also affecting patient's functional outcome.   REHAB  POTENTIAL: Good  CLINICAL DECISION MAKING: Evolving/moderate complexity  EVALUATION COMPLEXITY: Moderate   GOALS: Goals reviewed with patient? Yes  SHORT TERM GOALS: Target date: 04/05/24  Complete 6 min walk test to establish baseline endurance for amb  Baseline:at visit #2 Goal status: INITIAL   LONG TERM GOALS: Target date: 05/16/24  Improve LEFS >10 points indicating pt is less significantly limited in her ability to perform her daily activities Baseline: 40 Goal status: INITIAL  2.  Pt will be able to amb on even/uneven surfaces x 15 min without AD to be able to go on her beach trip and walk to/from beach at end of October Baseline: SPC used for amb on flat surfaces Goal status: INITIAL  3.  Pt will be able to perform >20 min  consecutive standing housechores to complete her housework/meal prep/laundry without being limited by fatigue/SOB Baseline: 10 min intervals Goal status: INITIAL    PLAN:  PT FREQUENCY: 2x/week  PT DURATION: 8 weeks  PLANNED INTERVENTIONS: 97110-Therapeutic exercises, 97530- Therapeutic activity, [id number]- Neuromuscular re-education, 97535- Self Care, [id number]- Manual therapy, and Patient/Family education.  PLAN FOR NEXT SESSION: continue with tx emphasizing endurance/conditioning, LE strengthening, stair intervals  Vernell Reges, PT, DPT, OCS  Vernell FORBES Reges, PT 04/24/2024, 9:01 AM

## 2024-04-26 ENCOUNTER — Encounter

## 2024-04-28 ENCOUNTER — Ambulatory Visit

## 2024-04-28 DIAGNOSIS — M6281 Muscle weakness (generalized): Secondary | ICD-10-CM

## 2024-04-28 DIAGNOSIS — R5381 Other malaise: Secondary | ICD-10-CM

## 2024-04-28 DIAGNOSIS — R262 Difficulty in walking, not elsewhere classified: Secondary | ICD-10-CM

## 2024-04-28 DIAGNOSIS — R296 Repeated falls: Secondary | ICD-10-CM

## 2024-04-28 NOTE — Therapy (Signed)
 OUTPATIENT PHYSICAL THERAPY THORACOLUMBAR Treatment   Patient Name: Brandi Bell MRN: [id number] DOB:11-14-1960, 63 y.o., female Today's Date: 04/28/2024  END OF SESSION:  PT End of Session - 04/28/24 0853     Visit Number 5    Number of Visits 17    Date for Recertification  05/16/24    Authorization Type request 2x/week x 8 weeks (Medicare PN needed at visit #10)    PT Start Time 0850    PT Stop Time 0930    PT Time Calculation (min) 40 min    Activity Tolerance Patient tolerated treatment well    Behavior During Therapy WFL for tasks assessed/performed          Past Medical History:  Diagnosis Date   Anemia    hx of   Anxiety    Arthritis    osteo Back, Feet, Hands   Asthma    [facility] admission - on Bipap - 7/13 - report on paper chart/ flare 3 mos ago   Bipolar disorder (HCC)    Bipolar disorder (HCC)    Bulging lumbar disc    Cancer (HCC)    cervical   Cataract    Depression    Elevated LFTs    having ultrasound [facility] - 12/14/14   GERD (gastroesophageal reflux disease)    vomiting   Headache    migraines, every three or 4 days/ stress related   Heart attack (HCC)    hx of/ Dr. 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, self repaired   History of kidney stones    History of migraine headaches    Hypertension    Echo -1/16 - report on paper chart/controlled on meds   IBS (irritable bowel syndrome)    Microhematuria    Motion sickness    Osteoporosis    Pneumonia    hx of   PONV (postoperative nausea and vomiting)    PTSD (post-traumatic stress disorder)    Restless leg syndrome    Rhinitis    Shortness of breath dyspnea    with exertion   Sleep apnea    central and obstructive/ CPAP   Spinal stenosis of cervical region    Suicide attempt [facility])    Vertigo 6 yrs ago   hx of   Past Surgical History:  Procedure Laterality Date   ANTERIOR AND POSTERIOR REPAIR  2002   done with bladder tact   BILATERAL  SALPINGOOPHORECTOMY  2004   pain from scar tissue   CARDIAC CATHETERIZATION  2008   [facility] - neg - report in paper chart   CATARACT EXTRACTION Bilateral 2007   CHOLECYSTECTOMY     COLONOSCOPY N/A 01/04/2015   Procedure: COLONOSCOPY;  Surgeon: Rogelia Copping, MD;  Location: [facility];  Service: Gastroenterology;  Laterality: N/A;  with biopsies   CYSTOSCOPY W/ RETROGRADES Right 10/26/2016   Procedure: CYSTOSCOPY WITH RETROGRADE PYELOGRAM;  Surgeon: Rosina Riis, MD;  Location: [facility];  Service: Urology;  Laterality: Right;   CYSTOSCOPY WITH STENT PLACEMENT Left 10/26/2016   Procedure: CYSTOSCOPY WITH STENT PLACEMENT;  Surgeon: Rosina Riis, MD;  Location: [facility];  Service: Urology;  Laterality: Left;   ESOPHAGOGASTRODUODENOSCOPY (EGD) WITH PROPOFOL  N/A 12/30/2015   Procedure: ESOPHAGOGASTRODUODENOSCOPY (EGD) WITH PROPOFOL ;  Surgeon: Rogelia Copping, MD;  Location: [facility];  Service: Endoscopy;  Laterality: N/A;   ESOPHAGOGASTRODUODENOSCOPY (EGD) WITH PROPOFOL  N/A 08/12/2017   Procedure: ESOPHAGOGASTRODUODENOSCOPY (EGD) WITH PROPOFOL ;  Surgeon: Copping Rogelia, MD;  Location: [facility];  Service: Endoscopy;  Laterality: N/A;  CPAP   FOOT SURGERY     multiple   SEPTOPLASTY     SHOULDER ARTHROSCOPY Bilateral 2008   TONSILLECTOMY  6   TUBAL LIGATION     URETEROSCOPY WITH HOLMIUM LASER LITHOTRIPSY Left 10/26/2016   Procedure: URETEROSCOPY WITH HOLMIUM LASER LITHOTRIPSY;  Surgeon: Rosina Riis, MD;  Location: [facility];  Service: Urology;  Laterality: Left;   VAGINAL HYSTERECTOMY  1989   cervical cancer age 6   Patient Active Problem List   Diagnosis Date Noted   Nausea and vomiting    OSA (obstructive sleep apnea) 02/08/2017   Vitamin D  deficiency 07/21/2016   Hx of suicide attempt 07/19/2016   PTSD (post-traumatic stress disorder) 07/19/2016   Migraine headache 07/19/2016   RLS (restless legs syndrome) 07/01/2016   Osteoporosis 07/01/2016   Trochanteric bursitis  03/17/2016   Prediabetes 03/17/2016   Gastritis    Pituitary adenoma (HCC) 10/02/2015   Borderline personality disorder (HCC) 02/10/2015   Chronic pain syndrome 02/10/2015   Arthritis 02/10/2015   Hx of colonic polyps    Seasonal allergies 12/21/2013   Fibromyalgia 12/21/2013   Moderate persistent asthma without complication 06/20/2013   Chronic nausea 06/20/2013   Bipolar affective disorder (HCC) 06/20/2013   Hx of cervical cancer 07/20/2012   Lumbar herniated disc 07/20/2012   Hyperlipidemia 07/20/2012   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 Dann, MD  REFERRING PROVIDER: Dr. Jesus  REFERRING DIAG:  Diagnosis  M54.31 (ICD-10-CM) - Sciatica, right side  M50.30 (ICD-10-CM) - Other cervical disc degeneration, unspecified cervical region  Z91.81 (ICD-10-CM) - History of falling    Rationale for Evaluation and Treatment: Rehabilitation  THERAPY DIAG:  Muscle weakness (generalized)  Physical deconditioning  Repeated falls  Difficulty in walking, not elsewhere classified  ONSET DATE: August 2025  SUBJECTIVE:                                                                                                                                                                                           SUBJECTIVE STATEMENT: Pt's main concern is she feels weak after multiple health concerns this year and multiple hospital stays (most recent in Aug 2025 for respiratory issues; per char review- recently returned to the hospital due to acute hypoxemic respiratory failure with worsening dyspnea and asthma exacerbation. )  Just had a course of home health PT- finished before outpatient PT   Started medications for breathing- those are helping; starts injection therapy this Friday (Dupixent)  Walking with a SPC, sometimes walker, and sometimes no cane.   Most  recently had a fall 2 weeks ago- physical fatigue she feels like contributes to  it  Difficulty standing stationary (bothers lower back)  Does some walking 100 m driveway, 4x wears a mask for this, feels SOB sometimes    PERTINENT HISTORY:  Just finished home health PT Was hospitalized for respiratory failure in August 2025 Has h/o low back pain- L3-4  Has had shoulder surgery b/l  PAIN:  Are you having pain? Currently 0/10 and at worst 9-10/10  PRECAUTIONS: Fall  RED FLAGS: None   WEIGHT BEARING RESTRICTIONS: No  FALLS:  Has patient fallen in last 6 months? Yes. Number of falls >3  LIVING ENVIRONMENT: Lives with: lives alone Lives in: Mobile home Stairs: 6 steps to enter/exit Has following equipment at home: Single point cane  Has family/sister support nearby where she lives   OCCUPATION: retired; enjoys Environmental manager, does puzzles  PLOF: Independent  PATIENT GOALS: to be able to walk, able to hike, able to walk at [employer] (beach trip planned for October 2025), able to travel again  NEXT MD VISIT: End of September   OBJECTIVE:  Note: Objective measures were completed at Evaluation unless otherwise noted.  DIAGNOSTIC FINDINGS: See chart review  PATIENT SURVEYS:  LEFS 40/80  COGNITION: Overall cognitive status: Within functional limits for tasks assessed     SENSATION: WFL   POSTURE: rounded shoulders   LUMBAR ROM: deferred  AROM eval  Flexion   Extension   Right lateral flexion   Left lateral flexion   Right rotation   Left rotation    (Blank rows = not tested)  LOWER EXTREMITY ROM:   deferred; pt able to transfer sit to stand  Active  Right eval Left eval  Hip flexion    Hip extension    Hip abduction    Hip adduction    Hip internal rotation    Hip external rotation    Knee flexion    Knee extension    Ankle dorsiflexion    Ankle plantarflexion    Ankle inversion    Ankle eversion     (Blank rows = not tested)  LOWER EXTREMITY MMT:    MMT Right eval Left eval  Hip flexion 4 4  Hip extension     Hip abduction    Hip adduction    Hip internal rotation    Hip external rotation    Knee flexion 4 4  Knee extension 4 4  Ankle dorsiflexion 4 4  Ankle plantarflexion    Ankle inversion    Ankle eversion     (Blank rows = not tested)   FUNCTIONAL TESTS:  5 times sit to stand: 9 seconds today   SLS: 3-5 seconds each LE   TUG 11 seconds today GAIT: Pt amb with SPC in clinic today  TREATMENT DATE: 04/28/24 Subjective:  Pt [place] with [place].  Did a lot of walking while she was gone.  Feeling pretty good upon arrival today. She is trying to do some of her HEP since being home.  Using [facility] for amb.    Objective: Pain 4/10 low back  Therapeutic Exercise: Nustep level 3 x 10 minutes, seat #9 SKTC x 5 with PT ea- not today Hooklying LTR x 5 ea- not today SLR with PT assist x 10 ea- not today Seated marches x 10 ea- not today LAQ: 3# 2x10 ea Hip abd with blue TB: 5 second holds x 20 Hip add with  ball (sitting): 5 second holds x 20  Therapeutic Activities: Squats with chair behind: x 10, 2 sets Sit to stand x 5, 2 sets with 5 lb weight (front squat) Heel raises x 10, 2 sets Standing hamstring curls: 3# 2x10 ea Standing hip abd: 3# x10 ea, 2 sets Lateral stepping along railing x 4 laps with 3# ankle weights Tandem stance R/L 20 second intervals x 4 ea (pt reports when R foot is posterior this is sore in her ankle) Amb in hallway: with SPC, 1 lap  Not today: Front squats: 7 # weight, 2x8- not today Goblet squats: weight to/from 6 inch box x 10- not today SL balance: R and L LE x 10-12 second intervals in parallel bars- not today Front step up intervals: 30 seconds on 6 inch step, x3- not today 6 min walk test: able to complete 3:45 minutes (8x56ft total=560 ft) then needed a sitting break due to R hip mm fatigue; test ended- not today   PATIENT EDUCATION:  Education details: HEP, PT POC/goals Person educated: Patient Education  method: Explanation Education comprehension: verbalized understanding  HOME EXERCISE PROGRAM: To be formally initiated at visit #2, encouraged pt to continue with current HEP and add front step up intervals to HEP, 3x 30 sec after her walks for LE strength/standing endurance  ASSESSMENT:  CLINICAL IMPRESSION: Patient arrived motivated to participate in today's session.  Able to tolerate progression of therapeutic exercises and activities today vs last session.  Tx addressed strength/endurance/balance impairments and she was able to perform without c/o increased pain.  Presents with deconditioning, muscle weakness, h/o lumbar radiculopathy, history of recent falls, decreased balance, and decreased activity tolerance.  She is an appropriate candidate for outpatient PT to address impairments below and to facilitate improved activity tolerance, improved safety during amb, and improved QOL.   OBJECTIVE IMPAIRMENTS: decreased activity tolerance, decreased balance, difficulty walking, decreased strength, impaired perceived functional ability, and pain.   ACTIVITY LIMITATIONS: standing, squatting, stairs, transfers, and locomotion level  PARTICIPATION LIMITATIONS: meal prep, cleaning, laundry, shopping, community activity, and yard work  PERSONAL FACTORS: Past/current experiences, Time since onset of injury/illness/exacerbation, and 1-2 comorbidities: see PMH are also affecting patient's functional outcome.   REHAB POTENTIAL: Good  CLINICAL DECISION MAKING: Evolving/moderate complexity  EVALUATION COMPLEXITY: Moderate   GOALS: Goals reviewed with patient? Yes  SHORT TERM GOALS: Target date: 04/05/24  Complete 6 min walk test to establish baseline endurance for amb  Baseline:at visit #2 Goal status: INITIAL   LONG TERM GOALS: Target date: 05/16/24  Improve LEFS >10 points indicating pt is less significantly limited in her ability to perform her daily activities Baseline: 40 Goal status:  INITIAL  2.  Pt will be able to amb on even/uneven surfaces x 15 min without AD to be able to go on her beach trip and walk to/from beach at end of October Baseline: SPC used for amb on flat surfaces Goal status: INITIAL  3.  Pt will be able to perform >20 min consecutive standing housechores to complete her housework/meal prep/laundry without being limited by fatigue/SOB Baseline: 10 min intervals Goal status: INITIAL    PLAN:  PT FREQUENCY: 2x/week  PT DURATION: 8 weeks  PLANNED INTERVENTIONS: 97110-Therapeutic exercises, 97530- Therapeutic activity, [id number]- Neuromuscular re-education, 97535- Self Care, [id number]- Manual therapy, and Patient/Family education.  PLAN FOR NEXT SESSION: continue with tx emphasizing endurance/conditioning, LE strengthening, stair intervals  Vernell Reges, PT, DPT, OCS  Malgorzata Albert E Leyan Branden, PT 04/28/2024, 8:53 AM

## 2024-05-01 ENCOUNTER — Ambulatory Visit

## 2024-05-01 DIAGNOSIS — R5381 Other malaise: Secondary | ICD-10-CM

## 2024-05-01 DIAGNOSIS — R296 Repeated falls: Secondary | ICD-10-CM

## 2024-05-01 DIAGNOSIS — R262 Difficulty in walking, not elsewhere classified: Secondary | ICD-10-CM

## 2024-05-01 DIAGNOSIS — M6281 Muscle weakness (generalized): Secondary | ICD-10-CM | POA: Diagnosis not present

## 2024-05-01 NOTE — Therapy (Signed)
 OUTPATIENT PHYSICAL THERAPY THORACOLUMBAR Treatment   Patient Name: Brandi Bell MRN: [id number] DOB:1961-04-13, 63 y.o., female Today's Date: 05/01/2024  END OF SESSION:  PT End of Session - 05/01/24 0900     Visit Number 6    Number of Visits 17    Date for Recertification  05/16/24    Authorization Type request 2x/week x 8 weeks (Medicare PN needed at visit #10)    PT Start Time 0900    PT Stop Time 0945    PT Time Calculation (min) 45 min    Activity Tolerance Patient tolerated treatment well    Behavior During Therapy WFL for tasks assessed/performed          Past Medical History:  Diagnosis Date   Anemia    hx of   Anxiety    Arthritis    osteo Back, Feet, Hands   Asthma    [facility] admission - on Bipap - 7/13 - report on paper chart/ flare 3 mos ago   Bipolar disorder (HCC)    Bipolar disorder (HCC)    Bulging lumbar disc    Cancer (HCC)    cervical   Cataract    Depression    Elevated LFTs    having ultrasound [facility] - 12/14/14   GERD (gastroesophageal reflux disease)    vomiting   Headache    migraines, every three or 4 days/ stress related   Heart attack (HCC)    hx of/ Dr. 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, self repaired   History of kidney stones    History of migraine headaches    Hypertension    Echo -1/16 - report on paper chart/controlled on meds   IBS (irritable bowel syndrome)    Microhematuria    Motion sickness    Osteoporosis    Pneumonia    hx of   PONV (postoperative nausea and vomiting)    PTSD (post-traumatic stress disorder)    Restless leg syndrome    Rhinitis    Shortness of breath dyspnea    with exertion   Sleep apnea    central and obstructive/ CPAP   Spinal stenosis of cervical region    Suicide attempt [facility])    Vertigo 6 yrs ago   hx of   Past Surgical History:  Procedure Laterality Date   ANTERIOR AND POSTERIOR REPAIR  2002   done with bladder tact   BILATERAL  SALPINGOOPHORECTOMY  2004   pain from scar tissue   CARDIAC CATHETERIZATION  2008   [facility] - neg - report in paper chart   CATARACT EXTRACTION Bilateral 2007   CHOLECYSTECTOMY     COLONOSCOPY N/A 01/04/2015   Procedure: COLONOSCOPY;  Surgeon: Rogelia Copping, MD;  Location: [facility];  Service: Gastroenterology;  Laterality: N/A;  with biopsies   CYSTOSCOPY W/ RETROGRADES Right 10/26/2016   Procedure: CYSTOSCOPY WITH RETROGRADE PYELOGRAM;  Surgeon: Rosina Riis, MD;  Location: [facility];  Service: Urology;  Laterality: Right;   CYSTOSCOPY WITH STENT PLACEMENT Left 10/26/2016   Procedure: CYSTOSCOPY WITH STENT PLACEMENT;  Surgeon: Rosina Riis, MD;  Location: [facility];  Service: Urology;  Laterality: Left;   ESOPHAGOGASTRODUODENOSCOPY (EGD) WITH PROPOFOL  N/A 12/30/2015   Procedure: ESOPHAGOGASTRODUODENOSCOPY (EGD) WITH PROPOFOL ;  Surgeon: Rogelia Copping, MD;  Location: [facility];  Service: Endoscopy;  Laterality: N/A;   ESOPHAGOGASTRODUODENOSCOPY (EGD) WITH PROPOFOL  N/A 08/12/2017   Procedure: ESOPHAGOGASTRODUODENOSCOPY (EGD) WITH PROPOFOL ;  Surgeon: Copping Rogelia, MD;  Location: [facility];  Service: Endoscopy;  Laterality: N/A;  CPAP   FOOT SURGERY     multiple   SEPTOPLASTY     SHOULDER ARTHROSCOPY Bilateral 2008   TONSILLECTOMY  6   TUBAL LIGATION     URETEROSCOPY WITH HOLMIUM LASER LITHOTRIPSY Left 10/26/2016   Procedure: URETEROSCOPY WITH HOLMIUM LASER LITHOTRIPSY;  Surgeon: Rosina Riis, MD;  Location: [facility];  Service: Urology;  Laterality: Left;   VAGINAL HYSTERECTOMY  1989   cervical cancer age 58   Patient Active Problem List   Diagnosis Date Noted   Nausea and vomiting    OSA (obstructive sleep apnea) 02/08/2017   Vitamin D  deficiency 07/21/2016   Hx of suicide attempt 07/19/2016   PTSD (post-traumatic stress disorder) 07/19/2016   Migraine headache 07/19/2016   RLS (restless legs syndrome) 07/01/2016   Osteoporosis 07/01/2016   Trochanteric bursitis  03/17/2016   Prediabetes 03/17/2016   Gastritis    Pituitary adenoma (HCC) 10/02/2015   Borderline personality disorder (HCC) 02/10/2015   Chronic pain syndrome 02/10/2015   Arthritis 02/10/2015   Hx of colonic polyps    Seasonal allergies 12/21/2013   Fibromyalgia 12/21/2013   Moderate persistent asthma without complication 06/20/2013   Chronic nausea 06/20/2013   Bipolar affective disorder (HCC) 06/20/2013   Hx of cervical cancer 07/20/2012   Lumbar herniated disc 07/20/2012   Hyperlipidemia 07/20/2012   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 Dann, MD  REFERRING PROVIDER: Dr. Jesus  REFERRING DIAG:  Diagnosis  M54.31 (ICD-10-CM) - Sciatica, right side  M50.30 (ICD-10-CM) - Other cervical disc degeneration, unspecified cervical region  Z91.81 (ICD-10-CM) - History of falling    Rationale for Evaluation and Treatment: Rehabilitation  THERAPY DIAG:  Muscle weakness (generalized)  Physical deconditioning  Repeated falls  Difficulty in walking, not elsewhere classified  ONSET DATE: August 2025  SUBJECTIVE:                                                                                                                                                                                           SUBJECTIVE STATEMENT: Pt's main concern is she feels weak after multiple health concerns this year and multiple hospital stays (most recent in Aug 2025 for respiratory issues; per char review- recently returned to the hospital due to acute hypoxemic respiratory failure with worsening dyspnea and asthma exacerbation. )  Just had a course of home health PT- finished before outpatient PT   Started medications for breathing- those are helping; starts injection therapy this Friday (Dupixent)  Walking with a SPC, sometimes walker, and sometimes no cane.   Most  recently had a fall 2 weeks ago- physical fatigue she feels like contributes to  it  Difficulty standing stationary (bothers lower back)  Does some walking 100 m driveway, 4x wears a mask for this, feels SOB sometimes    PERTINENT HISTORY:  Just finished home health PT Was hospitalized for respiratory failure in August 2025 Has h/o low back pain- L3-4  Has had shoulder surgery b/l  PAIN:  Are you having pain? Currently 0/10 and at worst 9-10/10  PRECAUTIONS: Fall  RED FLAGS: None   WEIGHT BEARING RESTRICTIONS: No  FALLS:  Has patient fallen in last 6 months? Yes. Number of falls >3  LIVING ENVIRONMENT: Lives with: lives alone Lives in: Mobile home Stairs: 6 steps to enter/exit Has following equipment at home: Single point cane  Has family/sister support nearby where she lives   OCCUPATION: retired; enjoys environmental manager, does puzzles  PLOF: Independent  PATIENT GOALS: to be able to walk, able to hike, able to walk [place] (beach trip planned for October 2025), able to travel again  NEXT MD VISIT: End of September   OBJECTIVE:  Note: Objective measures were completed at Evaluation unless otherwise noted.  DIAGNOSTIC FINDINGS: See chart review  PATIENT SURVEYS:  LEFS 40/80  COGNITION: Overall cognitive status: Within functional limits for tasks assessed     SENSATION: WFL   POSTURE: rounded shoulders   LUMBAR ROM: deferred  AROM eval  Flexion   Extension   Right lateral flexion   Left lateral flexion   Right rotation   Left rotation    (Blank rows = not tested)  LOWER EXTREMITY ROM:   deferred; pt able to transfer sit to stand  Active  Right eval Left eval  Hip flexion    Hip extension    Hip abduction    Hip adduction    Hip internal rotation    Hip external rotation    Knee flexion    Knee extension    Ankle dorsiflexion    Ankle plantarflexion    Ankle inversion    Ankle eversion     (Blank rows = not tested)  LOWER EXTREMITY MMT:    MMT Right eval Left eval  Hip flexion 4 4  Hip extension     Hip abduction    Hip adduction    Hip internal rotation    Hip external rotation    Knee flexion 4 4  Knee extension 4 4  Ankle dorsiflexion 4 4  Ankle plantarflexion    Ankle inversion    Ankle eversion     (Blank rows = not tested)   FUNCTIONAL TESTS:  5 times sit to stand: 9 seconds today   SLS: 3-5 seconds each LE   TUG 11 seconds today GAIT: Pt amb with SPC in clinic today  TREATMENT DATE: 05/01/24 Subjective:  Pt arrived today feeling pain in R LE- anterior/lateral hip area and also lower leg.    Objective: Pain 4/10 low back + 3+ pitting edema R and L distal LE, no redness, pt reports no fever, able to fully weight bear on R LE  Manual Therapy: (+) pain AROM= PROM limitation for R hip flexion and ER compared to L, (+) limited R hip IR and painful AROM>PROM R LE long axis traction (modified in hooklying today): 10x MRE contract/relax hip ER R x 10  Therapeutic Exercise:  Nustep level 3 x 10 minutes, seat #9- not today SKTC x 5 with PT ea Hooklying LTR x 5 ea-  not today SLR with PT assist x 10 ea Seated marches x 10 ea- not today LAQ: 3# 2x10 ea Hip abd with blue TB: 5 second holds x 20 Hip add with ball (sitting): 5 second holds x 20 Bridges: 2x5 partial ROM  Therapeutic Activities: Squats with chair behind: x 10, 2 sets Amb in hallway: with SPC, 1 lap Sit to stand x 5  Not today: Sit to stand x 5, 2 sets with 5 lb weight (front squat) Heel raises x 10, 2 sets Standing hamstring curls: 3# 2x10 ea Standing hip abd: 3# x10 ea, 2 sets Lateral stepping along railing x 4 laps with 3# ankle weights Tandem stance R/L 20 second intervals x 4 ea (pt reports when R foot is posterior this is sore in her ankle) Front squats: 7 # weight, 2x8- not today Goblet squats: weight to/from 6 inch box x 10- not today SL balance: R and L LE x 10-12 second intervals in parallel bars- not today Front step up intervals: 30 seconds on 6 inch step, x3- not today 6 min walk  test: able to complete 3:45 minutes (8x73ft total=560 ft) then needed a sitting break due to R hip mm fatigue; test ended- not today   PATIENT EDUCATION:  Education details: HEP, PT POC/goals Person educated: Patient Education method: Explanation Education comprehension: verbalized understanding  HOME EXERCISE PROGRAM: To be formally initiated at visit #2, encouraged pt to continue with current HEP and add front step up intervals to HEP, 3x 30 sec after her walks for LE strength/standing endurance  ASSESSMENT:  CLINICAL IMPRESSION: Patient arrived motivated to participate in today's session.  Presents with R hip hypomobility/pain.  Incorporated manual therapy tx to address this.  Tx addressed strength/endurance/balance impairments and she was able to perform without c/o increased pain.  Presents with deconditioning, muscle weakness, h/o lumbar radiculopathy, history of recent falls, decreased balance, and decreased activity tolerance.  She is an appropriate candidate for outpatient PT to address impairments below and to facilitate improved activity tolerance, improved safety during amb, and improved QOL.   OBJECTIVE IMPAIRMENTS: decreased activity tolerance, decreased balance, difficulty walking, decreased strength, impaired perceived functional ability, and pain.   ACTIVITY LIMITATIONS: standing, squatting, stairs, transfers, and locomotion level  PARTICIPATION LIMITATIONS: meal prep, cleaning, laundry, shopping, community activity, and yard work  PERSONAL FACTORS: Past/current experiences, Time since onset of injury/illness/exacerbation, and 1-2 comorbidities: see PMH are also affecting patient's functional outcome.   REHAB POTENTIAL: Good  CLINICAL DECISION MAKING: Evolving/moderate complexity  EVALUATION COMPLEXITY: Moderate   GOALS: Goals reviewed with patient? Yes  SHORT TERM GOALS: Target date: 04/05/24  Complete 6 min walk test to establish baseline endurance for amb   Baseline:at visit #2 Goal status: INITIAL   LONG TERM GOALS: Target date: 05/16/24  Improve LEFS >10 points indicating pt is less significantly limited in her ability to perform her daily activities Baseline: 40 Goal status: INITIAL  2.  Pt will be able to amb on even/uneven surfaces x 15 min without AD to be able to go on her beach trip and walk to/from beach at end of October Baseline: SPC used for amb on flat surfaces Goal status: INITIAL  3.  Pt will be able to perform >20 min consecutive standing housechores to complete her housework/meal prep/laundry without being limited by fatigue/SOB Baseline: 10 min intervals Goal status: INITIAL    PLAN:  PT FREQUENCY: 2x/week  PT DURATION: 8 weeks  PLANNED INTERVENTIONS: 97110-Therapeutic exercises, 97530- Therapeutic activity, [id number]- Neuromuscular re-education,  [id number]- Self Care, [id number]- Manual therapy, and Patient/Family education.  PLAN FOR NEXT SESSION: continue with tx emphasizing endurance/conditioning, LE strengthening, stair intervals  Vernell Reges, PT, DPT, OCS  Aliz Meritt E Advaith Lamarque, PT 05/01/2024, 9:01 AM

## 2024-05-03 ENCOUNTER — Ambulatory Visit

## 2024-05-03 DIAGNOSIS — M6281 Muscle weakness (generalized): Secondary | ICD-10-CM

## 2024-05-03 DIAGNOSIS — M5416 Radiculopathy, lumbar region: Secondary | ICD-10-CM

## 2024-05-03 DIAGNOSIS — R262 Difficulty in walking, not elsewhere classified: Secondary | ICD-10-CM

## 2024-05-03 DIAGNOSIS — R296 Repeated falls: Secondary | ICD-10-CM

## 2024-05-03 DIAGNOSIS — R5381 Other malaise: Secondary | ICD-10-CM

## 2024-05-03 NOTE — Therapy (Signed)
 OUTPATIENT PHYSICAL THERAPY THORACOLUMBAR Treatment   Patient Name: Brandi Bell MRN: [id number] DOB:10/13/1960, 63 y.o., female Today's Date: 05/03/2024  END OF SESSION:  PT End of Session - 05/03/24 0901     Visit Number 7    Number of Visits 17    Date for Recertification  05/16/24    Authorization Type request 2x/week x 8 weeks (Medicare PN needed at visit #10)    PT Start Time 0900    PT Stop Time 0945    PT Time Calculation (min) 45 min    Activity Tolerance Patient tolerated treatment well    Behavior During Therapy WFL for tasks assessed/performed          Past Medical History:  Diagnosis Date   Anemia    hx of   Anxiety    Arthritis    osteo Back, Feet, Hands   Asthma    [facility] admission - on Bipap - 7/13 - report on paper chart/ flare 3 mos ago   Bipolar disorder (HCC)    Bipolar disorder (HCC)    Bulging lumbar disc    Cancer (HCC)    cervical   Cataract    Depression    Elevated LFTs    having ultrasound [facility] - 12/14/14   GERD (gastroesophageal reflux disease)    vomiting   Headache    migraines, every three or 4 days/ stress related   Heart attack (HCC)    hx of/ Dr. 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, self repaired   History of kidney stones    History of migraine headaches    Hypertension    Echo -1/16 - report on paper chart/controlled on meds   IBS (irritable bowel syndrome)    Microhematuria    Motion sickness    Osteoporosis    Pneumonia    hx of   PONV (postoperative nausea and vomiting)    PTSD (post-traumatic stress disorder)    Restless leg syndrome    Rhinitis    Shortness of breath dyspnea    with exertion   Sleep apnea    central and obstructive/ CPAP   Spinal stenosis of cervical region    Suicide attempt [facility])    Vertigo 6 yrs ago   hx of   Past Surgical History:  Procedure Laterality Date   ANTERIOR AND POSTERIOR REPAIR  2002   done with bladder tact   BILATERAL  SALPINGOOPHORECTOMY  2004   pain from scar tissue   CARDIAC CATHETERIZATION  2008   [facility] - neg - report in paper chart   CATARACT EXTRACTION Bilateral 2007   CHOLECYSTECTOMY     COLONOSCOPY N/A 01/04/2015   Procedure: COLONOSCOPY;  Surgeon: Rogelia Copping, MD;  Location: [facility];  Service: Gastroenterology;  Laterality: N/A;  with biopsies   CYSTOSCOPY W/ RETROGRADES Right 10/26/2016   Procedure: CYSTOSCOPY WITH RETROGRADE PYELOGRAM;  Surgeon: Rosina Riis, MD;  Location: [facility];  Service: Urology;  Laterality: Right;   CYSTOSCOPY WITH STENT PLACEMENT Left 10/26/2016   Procedure: CYSTOSCOPY WITH STENT PLACEMENT;  Surgeon: Rosina Riis, MD;  Location: [facility];  Service: Urology;  Laterality: Left;   ESOPHAGOGASTRODUODENOSCOPY (EGD) WITH PROPOFOL  N/A 12/30/2015   Procedure: ESOPHAGOGASTRODUODENOSCOPY (EGD) WITH PROPOFOL ;  Surgeon: Rogelia Copping, MD;  Location: [facility];  Service: Endoscopy;  Laterality: N/A;   ESOPHAGOGASTRODUODENOSCOPY (EGD) WITH PROPOFOL  N/A 08/12/2017   Procedure: ESOPHAGOGASTRODUODENOSCOPY (EGD) WITH PROPOFOL ;  Surgeon: Copping Rogelia, MD;  Location: [facility];  Service: Endoscopy;  Laterality: N/A;  CPAP   FOOT SURGERY     multiple   SEPTOPLASTY     SHOULDER ARTHROSCOPY Bilateral 2008   TONSILLECTOMY  6   TUBAL LIGATION     URETEROSCOPY WITH HOLMIUM LASER LITHOTRIPSY Left 10/26/2016   Procedure: URETEROSCOPY WITH HOLMIUM LASER LITHOTRIPSY;  Surgeon: Rosina Riis, MD;  Location: [facility];  Service: Urology;  Laterality: Left;   VAGINAL HYSTERECTOMY  1989   cervical cancer age 13   Patient Active Problem List   Diagnosis Date Noted   Nausea and vomiting    OSA (obstructive sleep apnea) 02/08/2017   Vitamin D  deficiency 07/21/2016   Hx of suicide attempt 07/19/2016   PTSD (post-traumatic stress disorder) 07/19/2016   Migraine headache 07/19/2016   RLS (restless legs syndrome) 07/01/2016   Osteoporosis 07/01/2016   Trochanteric bursitis  03/17/2016   Prediabetes 03/17/2016   Gastritis    Pituitary adenoma (HCC) 10/02/2015   Borderline personality disorder (HCC) 02/10/2015   Chronic pain syndrome 02/10/2015   Arthritis 02/10/2015   Hx of colonic polyps    Seasonal allergies 12/21/2013   Fibromyalgia 12/21/2013   Moderate persistent asthma without complication 06/20/2013   Chronic nausea 06/20/2013   Bipolar affective disorder (HCC) 06/20/2013   Hx of cervical cancer 07/20/2012   Lumbar herniated disc 07/20/2012   Hyperlipidemia 07/20/2012   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 Dann, MD  REFERRING PROVIDER: Dr. Jesus  REFERRING DIAG:  Diagnosis  M54.31 (ICD-10-CM) - Sciatica, right side  M50.30 (ICD-10-CM) - Other cervical disc degeneration, unspecified cervical region  Z91.81 (ICD-10-CM) - History of falling    Rationale for Evaluation and Treatment: Rehabilitation  THERAPY DIAG:  Muscle weakness (generalized)  Physical deconditioning  Repeated falls  Difficulty in walking, not elsewhere classified  Radiculopathy, lumbar region  ONSET DATE: August 2025  SUBJECTIVE:                                                                                                                                                                                           SUBJECTIVE STATEMENT: Pt's main concern is she feels weak after multiple health concerns this year and multiple hospital stays (most recent in Aug 2025 for respiratory issues; per char review- recently returned to the hospital due to acute hypoxemic respiratory failure with worsening dyspnea and asthma exacerbation. )  Just had a course of home health PT- finished before outpatient PT   Started medications for breathing- those are helping; starts injection therapy this Friday (Dupixent)  Walking with a SPC, sometimes walker, and sometimes no  cane.   Most recently had a fall 2 weeks ago- physical fatigue she feels  like contributes to it  Difficulty standing stationary (bothers lower back)  Does some walking 100 m driveway, 4x wears a mask for this, feels SOB sometimes    PERTINENT HISTORY:  Just finished home health PT Was hospitalized for respiratory failure in August 2025 Has h/o low back pain- L3-4  Has had shoulder surgery b/l  PAIN:  Are you having pain? Currently 0/10 and at worst 9-10/10  PRECAUTIONS: Fall  RED FLAGS: None   WEIGHT BEARING RESTRICTIONS: No  FALLS:  Has patient fallen in last 6 months? Yes. Number of falls >3  LIVING ENVIRONMENT: Lives with: lives alone Lives in: Mobile home Stairs: 6 steps to enter/exit Has following equipment at home: Single point cane  Has family/sister support nearby where she lives   OCCUPATION: retired; enjoys environmental manager, does puzzles  PLOF: Independent  PATIENT GOALS: to be able to walk, able to hike, able to walk [place] (beach trip planned for October 2025), able to travel again  NEXT MD VISIT: End of September   OBJECTIVE:  Note: Objective measures were completed at Evaluation unless otherwise noted.  DIAGNOSTIC FINDINGS: See chart review  PATIENT SURVEYS:  LEFS 40/80  COGNITION: Overall cognitive status: Within functional limits for tasks assessed     SENSATION: WFL   POSTURE: rounded shoulders   LUMBAR ROM: deferred  AROM eval  Flexion   Extension   Right lateral flexion   Left lateral flexion   Right rotation   Left rotation    (Blank rows = not tested)  LOWER EXTREMITY ROM:   deferred; pt able to transfer sit to stand  Active  Right eval Left eval  Hip flexion    Hip extension    Hip abduction    Hip adduction    Hip internal rotation    Hip external rotation    Knee flexion    Knee extension    Ankle dorsiflexion    Ankle plantarflexion    Ankle inversion    Ankle eversion     (Blank rows = not tested)  LOWER EXTREMITY MMT:    MMT Right eval Left eval  Hip flexion 4 4   Hip extension    Hip abduction    Hip adduction    Hip internal rotation    Hip external rotation    Knee flexion 4 4  Knee extension 4 4  Ankle dorsiflexion 4 4  Ankle plantarflexion    Ankle inversion    Ankle eversion     (Blank rows = not tested)   FUNCTIONAL TESTS:  5 times sit to stand: 9 seconds today   SLS: 3-5 seconds each LE   TUG 11 seconds today GAIT: Pt amb with SPC in clinic today  TREATMENT DATE: 05/03/24 Subjective:  Pt arrived today reporting she feels better than last session.  Got good sleep last night  Objective: Pain 4/10 low back + 3+ pitting edema R and L distal LE, no redness, pt reports no fever, able to fully weight bear on R LE  Manual Therapy: (+) pain AROM= PROM limitation for R hip flexion and ER compared to L, (+) limited R hip IR and painful AROM>PROM R LE long axis traction (modified in hooklying today): 10x MRE contract/relax hip ER R x 10   Therapeutic Exercise:  Nustep level 3 x 10 minutes, seat #9- not today SKTC x 5 with PT ea Hooklying  LTR x 5 ea-  SLR with PT assist x 10 ea Seated marches x 10 ea- not today LAQ: 3# 2x10 ea Hip abd with blue TB: 5 second holds x 20 Hip add with ball (sitting): 5 second holds x 20 Bridges: 2x5 partial ROM  Therapeutic Activities: Squats with chair behind: x 10, 2 sets Amb in hallway: with SPC, 1 lap Sit to stand x 5 Standing side stepping in parallel bars 5 laps Tandem stance on airex:4x 20 sec Feet together on airex: 1 min  Not today: Sit to stand x 5, 2 sets with 5 lb weight (front squat) Heel raises x 10, 2 sets Standing hamstring curls: 3# 2x10 ea Standing hip abd: 3# x10 ea, 2 sets Front squats: 7 # weight, 2x8- not today Goblet squats: weight to/from 6 inch box x 10- not today SL balance: R and L LE x 10-12 second intervals in parallel bars- not today Front step up intervals: 30 seconds on 6 inch step, x3- not today 6 min walk test: able to complete 3:45 minutes (8x5ft  total=560 ft) then needed a sitting break due to R hip mm fatigue; test ended- not today   PATIENT EDUCATION:  Education details: HEP, PT POC/goals Person educated: Patient Education method: Explanation Education comprehension: verbalized understanding  HOME EXERCISE PROGRAM: To be formally initiated at visit #2, encouraged pt to continue with current HEP and add front step up intervals to HEP, 3x 30 sec after her walks for LE strength/standing endurance  ASSESSMENT:  CLINICAL IMPRESSION: Patient arrived motivated to participate in today's session.  Able to tolerate additional therapeutic exercises and activities compared to previous visit.  Pt amb without SPC in clinic.  HR and SpO2% remained within normal ranges during exercise today.  She was challenged with gluteal mm strengthening.  Presents with deconditioning, muscle weakness, h/o lumbar radiculopathy, history of recent falls, decreased balance, and decreased activity tolerance.  She is an appropriate candidate for outpatient PT to address impairments below and to facilitate improved activity tolerance, improved safety during amb, and improved QOL.   OBJECTIVE IMPAIRMENTS: decreased activity tolerance, decreased balance, difficulty walking, decreased strength, impaired perceived functional ability, and pain.   ACTIVITY LIMITATIONS: standing, squatting, stairs, transfers, and locomotion level  PARTICIPATION LIMITATIONS: meal prep, cleaning, laundry, shopping, community activity, and yard work  PERSONAL FACTORS: Past/current experiences, Time since onset of injury/illness/exacerbation, and 1-2 comorbidities: see PMH are also affecting patient's functional outcome.   REHAB POTENTIAL: Good  CLINICAL DECISION MAKING: Evolving/moderate complexity  EVALUATION COMPLEXITY: Moderate   GOALS: Goals reviewed with patient? Yes  SHORT TERM GOALS: Target date: 04/05/24  Complete 6 min walk test to establish baseline endurance for amb   Baseline:at visit #2 Goal status: INITIAL   LONG TERM GOALS: Target date: 05/16/24  Improve LEFS >10 points indicating pt is less significantly limited in her ability to perform her daily activities Baseline: 40 Goal status: INITIAL  2.  Pt will be able to amb on even/uneven surfaces x 15 min without AD to be able to go on her beach trip and walk to/from beach at end of October Baseline: SPC used for amb on flat surfaces Goal status: INITIAL  3.  Pt will be able to perform >20 min consecutive standing housechores to complete her housework/meal prep/laundry without being limited by fatigue/SOB Baseline: 10 min intervals Goal status: INITIAL    PLAN:  PT FREQUENCY: 2x/week  PT DURATION: 8 weeks  PLANNED INTERVENTIONS: 97110-Therapeutic exercises, 97530- Therapeutic activity, [id number]- Neuromuscular re-education,  [id number]- Self Care, [id number]- Manual therapy, and Patient/Family education.  PLAN FOR NEXT SESSION: continue with tx emphasizing endurance/conditioning, LE strengthening, stair intervals  Vernell Reges, PT, DPT, OCS  Lark Runk E Naisha Wisdom, PT 05/03/2024, 9:02 AM

## 2024-05-08 ENCOUNTER — Ambulatory Visit: Attending: Internal Medicine

## 2024-05-08 DIAGNOSIS — R296 Repeated falls: Secondary | ICD-10-CM | POA: Diagnosis present

## 2024-05-08 DIAGNOSIS — M6281 Muscle weakness (generalized): Secondary | ICD-10-CM | POA: Insufficient documentation

## 2024-05-08 DIAGNOSIS — R262 Difficulty in walking, not elsewhere classified: Secondary | ICD-10-CM | POA: Diagnosis present

## 2024-05-08 DIAGNOSIS — R5381 Other malaise: Secondary | ICD-10-CM | POA: Insufficient documentation

## 2024-05-08 NOTE — Therapy (Signed)
 OUTPATIENT PHYSICAL THERAPY THORACOLUMBAR Treatment   Patient Name: Brandi Bell MRN: [id number] DOB:[birth year], 63 y.o., female Today's Date: 05/08/2024  END OF SESSION:  PT End of Session - 05/08/24 1031     Visit Number 8    Number of Visits 17    Date for Recertification  05/16/24    Authorization Type request 2x/week x 8 weeks (Medicare PN needed at visit #10)    PT Start Time 1030    PT Stop Time 1115    PT Time Calculation (min) 45 min    Activity Tolerance Patient tolerated treatment well    Behavior During Therapy WFL for tasks assessed/performed          Past Medical History:  Diagnosis Date   Anemia    hx of   Anxiety    Arthritis    osteo Back, Feet, Hands   Asthma    [facility] admission - on Bipap - 7/13 - report on paper chart/ flare 3 mos ago   Bipolar disorder (HCC)    Bipolar disorder (HCC)    Bulging lumbar disc    Cancer (HCC)    cervical   Cataract    Depression    Elevated LFTs    having ultrasound [facility] - 12/14/14   GERD (gastroesophageal reflux disease)    vomiting   Headache    migraines, every three or 4 days/ stress related   Heart attack (HCC)    hx of/ Dr. 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, self repaired   History of kidney stones    History of migraine headaches    Hypertension    Echo -1/16 - report on paper chart/controlled on meds   IBS (irritable bowel syndrome)    Microhematuria    Motion sickness    Osteoporosis    Pneumonia    hx of   PONV (postoperative nausea and vomiting)    PTSD (post-traumatic stress disorder)    Restless leg syndrome    Rhinitis    Shortness of breath dyspnea    with exertion   Sleep apnea    central and obstructive/ CPAP   Spinal stenosis of cervical region    Suicide attempt [facility])    Vertigo 6 yrs ago   hx of   Past Surgical History:  Procedure Laterality Date   ANTERIOR AND POSTERIOR REPAIR  2002   done with bladder tact   BILATERAL  SALPINGOOPHORECTOMY  2004   pain from scar tissue   CARDIAC CATHETERIZATION  2008   [facility] - neg - report in paper chart   CATARACT EXTRACTION Bilateral 2007   CHOLECYSTECTOMY     COLONOSCOPY N/A 01/04/2015   Procedure: COLONOSCOPY;  Surgeon: Rogelia Copping, MD;  Location: [facility];  Service: Gastroenterology;  Laterality: N/A;  with biopsies   CYSTOSCOPY W/ RETROGRADES Right 10/26/2016   Procedure: CYSTOSCOPY WITH RETROGRADE PYELOGRAM;  Surgeon: Rosina Riis, MD;  Location: [facility];  Service: Urology;  Laterality: Right;   CYSTOSCOPY WITH STENT PLACEMENT Left 10/26/2016   Procedure: CYSTOSCOPY WITH STENT PLACEMENT;  Surgeon: Rosina Riis, MD;  Location: [facility];  Service: Urology;  Laterality: Left;   ESOPHAGOGASTRODUODENOSCOPY (EGD) WITH PROPOFOL  N/A 12/30/2015   Procedure: ESOPHAGOGASTRODUODENOSCOPY (EGD) WITH PROPOFOL ;  Surgeon: Rogelia Copping, MD;  Location: [facility];  Service: Endoscopy;  Laterality: N/A;   ESOPHAGOGASTRODUODENOSCOPY (EGD) WITH PROPOFOL  N/A 08/12/2017   Procedure: ESOPHAGOGASTRODUODENOSCOPY (EGD) WITH PROPOFOL ;  Surgeon: Copping Rogelia, MD;  Location: [facility];  Service: Endoscopy;  Laterality: N/A;  CPAP   FOOT SURGERY     multiple   SEPTOPLASTY     SHOULDER ARTHROSCOPY Bilateral 2008   TONSILLECTOMY  6   TUBAL LIGATION     URETEROSCOPY WITH HOLMIUM LASER LITHOTRIPSY Left 10/26/2016   Procedure: URETEROSCOPY WITH HOLMIUM LASER LITHOTRIPSY;  Surgeon: Rosina Riis, MD;  Location: [facility];  Service: Urology;  Laterality: Left;   VAGINAL HYSTERECTOMY  1989   cervical cancer age 49   Patient Active Problem List   Diagnosis Date Noted   Nausea and vomiting    OSA (obstructive sleep apnea) 02/08/2017   Vitamin D  deficiency 07/21/2016   Hx of suicide attempt 07/19/2016   PTSD (post-traumatic stress disorder) 07/19/2016   Migraine headache 07/19/2016   RLS (restless legs syndrome) 07/01/2016   Osteoporosis 07/01/2016   Trochanteric bursitis  03/17/2016   Prediabetes 03/17/2016   Gastritis    Pituitary adenoma (HCC) 10/02/2015   Borderline personality disorder (HCC) 02/10/2015   Chronic pain syndrome 02/10/2015   Arthritis 02/10/2015   Hx of colonic polyps    Seasonal allergies 12/21/2013   Fibromyalgia 12/21/2013   Moderate persistent asthma without complication 06/20/2013   Chronic nausea 06/20/2013   Bipolar affective disorder (HCC) 06/20/2013   Hx of cervical cancer 07/20/2012   Lumbar herniated disc 07/20/2012   Hyperlipidemia 07/20/2012   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 Dann, MD  REFERRING PROVIDER: Dr. Jesus  REFERRING DIAG:  Diagnosis  M54.31 (ICD-10-CM) - Sciatica, right side  M50.30 (ICD-10-CM) - Other cervical disc degeneration, unspecified cervical region  Z91.81 (ICD-10-CM) - History of falling    Rationale for Evaluation and Treatment: Rehabilitation  THERAPY DIAG:  Muscle weakness (generalized)  Physical deconditioning  Repeated falls  Difficulty in walking, not elsewhere classified  ONSET DATE: August 2025  SUBJECTIVE:                                                                                                                                                                                           SUBJECTIVE STATEMENT: Pt's main concern is she feels weak after multiple health concerns this year and multiple hospital stays (most recent in Aug 2025 for respiratory issues; per char review- recently returned to the hospital due to acute hypoxemic respiratory failure with worsening dyspnea and asthma exacerbation. )  Just had a course of home health PT- finished before outpatient PT   Started medications for breathing- those are helping; starts injection therapy this Friday (Dupixent)  Walking with a SPC, sometimes walker, and sometimes no cane.   Most  recently had a fall 2 weeks ago- physical fatigue she feels like contributes to  it  Difficulty standing stationary (bothers lower back)  Does some walking 100 m driveway, 4x wears a mask for this, feels SOB sometimes    PERTINENT HISTORY:  Just finished home health PT Was hospitalized for respiratory failure in August 2025 Has h/o low back pain- L3-4  Has had shoulder surgery b/l  PAIN:  Are you having pain? Currently 0/10 and at worst 9-10/10  PRECAUTIONS: Fall  RED FLAGS: None   WEIGHT BEARING RESTRICTIONS: No  FALLS:  Has patient fallen in last 6 months? Yes. Number of falls >3  LIVING ENVIRONMENT: Lives with: lives alone Lives in: Mobile home Stairs: 6 steps to enter/exit Has following equipment at home: Single point cane  Has family/sister support nearby where she lives   OCCUPATION: retired; enjoys environmental manager, does puzzles  PLOF: Independent  PATIENT GOALS: to be able to walk, able to hike, able to walk [place] (beach trip planned for October 2025), able to travel again  NEXT MD VISIT: End of September   OBJECTIVE:  Note: Objective measures were completed at Evaluation unless otherwise noted.  DIAGNOSTIC FINDINGS: See chart review  PATIENT SURVEYS:  LEFS 40/80  COGNITION: Overall cognitive status: Within functional limits for tasks assessed     SENSATION: WFL   POSTURE: rounded shoulders   LUMBAR ROM: deferred  AROM eval  Flexion   Extension   Right lateral flexion   Left lateral flexion   Right rotation   Left rotation    (Blank rows = not tested)  LOWER EXTREMITY ROM:   deferred; pt able to transfer sit to stand  Active  Right eval Left eval  Hip flexion    Hip extension    Hip abduction    Hip adduction    Hip internal rotation    Hip external rotation    Knee flexion    Knee extension    Ankle dorsiflexion    Ankle plantarflexion    Ankle inversion    Ankle eversion     (Blank rows = not tested)  LOWER EXTREMITY MMT:    MMT Right eval Left eval  Hip flexion 4 4  Hip extension     Hip abduction    Hip adduction    Hip internal rotation    Hip external rotation    Knee flexion 4 4  Knee extension 4 4  Ankle dorsiflexion 4 4  Ankle plantarflexion    Ankle inversion    Ankle eversion     (Blank rows = not tested)   FUNCTIONAL TESTS:  5 times sit to stand: 9 seconds today   SLS: 3-5 seconds each LE   TUG 11 seconds today GAIT: Pt amb with SPC in clinic today  TREATMENT DATE: 05/08/24 Subjective:  Pt states she is going to see her pain doctor Friday for her lower back/R radiculopathy.  R leg feels sore/weak.  R leg has shooting pain intermittently.  No falls, but felt unsteady.    Objective: Pain 4/10 low back + 3+ pitting edema R and L distal LE, no redness, pt reports no fever, able to fully weight bear on R LE  Manual Therapy: not today (+) pain AROM= PROM limitation for R hip flexion and ER compared to L, (+) limited R hip IR and painful AROM>PROM R LE long axis traction (modified in hooklying today): 10x MRE contract/relax hip ER R x 10   Therapeutic Exercise:  Nustep level 3 x 10 minutes, seat #9 SKTC x 5 with PT ea Hooklying LTR x 5 ea SLR with PT assist x 10 ea Seated marches x 10 ea- not today LAQ: 3# 2x10 ea Hip abd with blue TB: 5 second holds x 20 Hip add with ball (sitting): 5 second holds x 20 Bridges: 2x5 partial ROM  Therapeutic Activities: Squats with chair behind: x 10, 2 sets Amb in hallway: with SPC, 1 lap Sit to stand x 5 Standing side stepping in parallel bars 5 laps Tandem stance on airex:4x 20 sec Feet together on airex: 1 min  Neuro re-ed: (+) R SLR for reproduction of sx Sciatic n glides x10, 2 sets   Not today: Sit to stand x 5, 2 sets with 5 lb weight (front squat) Heel raises x 10, 2 sets Standing hamstring curls: 3# 2x10 ea Standing hip abd: 3# x10 ea, 2 sets Front squats: 7 # weight, 2x8- not today Goblet squats: weight to/from 6 inch box x 10- not today SL balance: R and L LE x 10-12 second intervals  in parallel bars- not today Front step up intervals: 30 seconds on 6 inch step, x3- not today 6 min walk test: able to complete 3:45 minutes (8x57ft total=560 ft) then needed a sitting break due to R hip mm fatigue; test ended- not today   PATIENT EDUCATION:  Education details: HEP, PT POC/goals Person educated: Patient Education method: Explanation Education comprehension: verbalized understanding  HOME EXERCISE PROGRAM: To be formally initiated at visit #2, encouraged pt to continue with current HEP and add front step up intervals to HEP, 3x 30 sec after her walks for LE strength/standing endurance  ASSESSMENT:  CLINICAL IMPRESSION: Patient arrived motivated to participate in today's session.  Incorporated nerve glides into tx plan for today to address radiculitis sx. Presents with deconditioning, muscle weakness, h/o lumbar radiculopathy, history of recent falls, decreased balance, and decreased activity tolerance.  She is an appropriate candidate for outpatient PT to address impairments below and to facilitate improved activity tolerance, improved safety during amb, and improved QOL.   OBJECTIVE IMPAIRMENTS: decreased activity tolerance, decreased balance, difficulty walking, decreased strength, impaired perceived functional ability, and pain.   ACTIVITY LIMITATIONS: standing, squatting, stairs, transfers, and locomotion level  PARTICIPATION LIMITATIONS: meal prep, cleaning, laundry, shopping, community activity, and yard work  PERSONAL FACTORS: Past/current experiences, Time since onset of injury/illness/exacerbation, and 1-2 comorbidities: see PMH are also affecting patient's functional outcome.   REHAB POTENTIAL: Good  CLINICAL DECISION MAKING: Evolving/moderate complexity  EVALUATION COMPLEXITY: Moderate   GOALS: Goals reviewed with patient? Yes  SHORT TERM GOALS: Target date: 04/05/24  Complete 6 min walk test to establish baseline endurance for amb  Baseline:at visit  #2 Goal status: INITIAL   LONG TERM GOALS: Target date: 05/16/24  Improve LEFS >10 points indicating pt is less significantly limited in her ability to perform her daily activities Baseline: 40 Goal status: INITIAL  2.  Pt will be able to amb on even/uneven surfaces x 15 min without AD to be able to go on her beach trip and walk to/from beach at end of October Baseline: SPC used for amb on flat surfaces Goal status: INITIAL  3.  Pt will be able to perform >20 min consecutive standing housechores to complete her housework/meal prep/laundry without being limited by fatigue/SOB Baseline: 10 min intervals Goal status: INITIAL    PLAN:  PT FREQUENCY: 2x/week  PT DURATION: 8 weeks  PLANNED INTERVENTIONS: 97110-Therapeutic exercises, 97530- Therapeutic  activity, [id number]- Neuromuscular re-education, [phone]- Self Care, [id number]- Manual therapy, and Patient/Family education.  PLAN FOR NEXT SESSION: continue with tx emphasizing endurance/conditioning, LE strengthening, stair intervals  Vernell Reges, PT, DPT, OCS  Vernell FORBES Reges, PT 05/08/2024, 10:31 AM

## 2024-05-10 ENCOUNTER — Ambulatory Visit

## 2024-05-12 ENCOUNTER — Ambulatory Visit

## 2024-05-12 NOTE — Progress Notes (Signed)
 [facility] Physical Medicine and Rehabilitation  Follow-Up Note  Patient Name:Brandi Bell MRN: [id number] DOB: [birth year] Age: 63 y.o.  Date: 05/12/2024   ASSESSMENT Pt is a 63 y.o. year old female with what appears to be right pelvis/hip related pain  - No leg weakness -Has lymphedema - X-rays look fairly unremarkable  PLAN  1.  Will obtain an MRI of the hip and follow-up in clinic  Diagnoses and all orders for this visit:  Pain of right hip -     MRI Lower Extremity Joint Right Wo Contrast; Future   - Return for several days after MRI.   SUBJECTIVE Chief complaint: Back Pain, Leg Pain, and Groin Pain   History of present Illness: Pt is a 64 y.o. year old female seen for follow-up regarding groin and buttocks pain.  Patient states that since I last seen her in clinic her left anterior thigh pain is actually improved.  However, she is getting significant upper buttocks and groin pain.  To be worse when she transitions.  However, she has been able to do squats without any significant issues.  Is typically okay with open kinetic chain movements..    Medications:has a current medication list which includes the following prescription(s): acetaminophen , albuterol , albuterol , amlodipine, aspirin, bupropion, calcium carbonate/vitamin d3, cetirizine, cyanocobalamin (vitamin b-12), duloxetine, dupixent pen, epinephrine, esomeprazole , fluticasone propion-salmeterol, furosemide, hydroxyzine, ipratropium-albuterol , levomefolate calcium, magnesium  oxide, montelukast, nitroglycerin, ondansetron , polyethylene glycol, pregabalin, ropinirole, rosuvastatin, senna, tramadol , and naloxone.  Allergies:Ciprofloxacin, Cymbalta [duloxetine], Imipramine, Oxybutynin , Quetiapine, Cat dander, Cat's claw, Adhesive, Adhesive tape-silicones, Atorvastatin, Clindamycin, Diclofenac , Haldol  [haloperidol lactate], Haldol [haloperidol], Meloxicam, Morphine, Naproxen, Opioids - morphine analogues, Peanut butter  flavor, Penicillin, Pineapple, Pravastatin, Prochlorperazine, Sulfa (sulfonamide antibiotics), Sulfasalazine, Tapentadol, and Toradol  [ketorolac ]  Past Medical History:  No interval changes.  Review of Systems: Pertinent items are noted in HPI.    OBJECTIVE  Temp 36.5 C (97.7 F)   Ht 154.9 cm (5' 0.98)   Wt (!) 101.2 kg (223 lb 1.6 oz)   LMP  (LMP Unknown)   BMI 42.18 kg/m   Physical Exam:  GENERAL: Pleasant HEENT: Normocephalic, atraumatic RESP: Breathing unlabored, nondyspneic MSK: Positive FABER maneuver I could not do other maneuvers as her pain was too significant NEURO: Nonantalgic gait PSYCH: Appropriate affect, cooperative   Diagnostic Studies: Reviewed MRI of the lumbar spine and x-rays of the hips today.

## 2024-05-17 ENCOUNTER — Ambulatory Visit

## 2024-05-19 ENCOUNTER — Ambulatory Visit

## 2024-05-19 DIAGNOSIS — R296 Repeated falls: Secondary | ICD-10-CM

## 2024-05-19 DIAGNOSIS — M6281 Muscle weakness (generalized): Secondary | ICD-10-CM

## 2024-05-19 DIAGNOSIS — R262 Difficulty in walking, not elsewhere classified: Secondary | ICD-10-CM

## 2024-05-19 DIAGNOSIS — R5381 Other malaise: Secondary | ICD-10-CM

## 2024-05-19 NOTE — Therapy (Addendum)
 OUTPATIENT PHYSICAL THERAPY THORACOLUMBAR Treatment/Progress Note/Re-cert through 06/13/24   Patient Name: Brandi Bell MRN: [id number] DOB:June 24, 1961, 63 y.o., female Today's Date: 05/19/2024  END OF SESSION:  PT End of Session - 05/19/24 0839     Visit Number 9    Number of Visits 17    Date for Recertification  05/16/24    Authorization Type request 2x/week x 8 weeks (Medicare PN needed at visit #10)    PT Start Time 0800    PT Stop Time 0845    PT Time Calculation (min) 45 min    Activity Tolerance Patient tolerated treatment well    Behavior During Therapy WFL for tasks assessed/performed           Past Medical History:  Diagnosis Date   Anemia    hx of   Anxiety    Arthritis    osteo Back, Feet, Hands   Asthma    [facility] admission - on Bipap - 7/13 - report on paper chart/ flare 3 mos ago   Bipolar disorder (HCC)    Bipolar disorder (HCC)    Bulging lumbar disc    Cancer (HCC)    cervical   Cataract    Depression    Elevated LFTs    having ultrasound [facility] - 12/14/14   GERD (gastroesophageal reflux disease)    vomiting   Headache    migraines, every three or 4 days/ stress related   Heart attack (HCC)    hx of/ Dr. 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, self repaired   History of kidney stones    History of migraine headaches    Hypertension    Echo -1/16 - report on paper chart/controlled on meds   IBS (irritable bowel syndrome)    Microhematuria    Motion sickness    Osteoporosis    Pneumonia    hx of   PONV (postoperative nausea and vomiting)    PTSD (post-traumatic stress disorder)    Restless leg syndrome    Rhinitis    Shortness of breath dyspnea    with exertion   Sleep apnea    central and obstructive/ CPAP   Spinal stenosis of cervical region    Suicide attempt [facility])    Vertigo 6 yrs ago   hx of   Past Surgical History:  Procedure Laterality Date   ANTERIOR AND POSTERIOR REPAIR  2002   done  with bladder tact   BILATERAL SALPINGOOPHORECTOMY  2004   pain from scar tissue   CARDIAC CATHETERIZATION  2008   [facility] - neg - report in paper chart   CATARACT EXTRACTION Bilateral 2007   CHOLECYSTECTOMY     COLONOSCOPY N/A 01/04/2015   Procedure: COLONOSCOPY;  Surgeon: Rogelia Copping, MD;  Location: [facility];  Service: Gastroenterology;  Laterality: N/A;  with biopsies   CYSTOSCOPY W/ RETROGRADES Right 10/26/2016   Procedure: CYSTOSCOPY WITH RETROGRADE PYELOGRAM;  Surgeon: Rosina Riis, MD;  Location: [facility];  Service: Urology;  Laterality: Right;   CYSTOSCOPY WITH STENT PLACEMENT Left 10/26/2016   Procedure: CYSTOSCOPY WITH STENT PLACEMENT;  Surgeon: Rosina Riis, MD;  Location: [facility];  Service: Urology;  Laterality: Left;   ESOPHAGOGASTRODUODENOSCOPY (EGD) WITH PROPOFOL  N/A 12/30/2015   Procedure: ESOPHAGOGASTRODUODENOSCOPY (EGD) WITH PROPOFOL ;  Surgeon: Rogelia Copping, MD;  Location: [facility];  Service: Endoscopy;  Laterality: N/A;   ESOPHAGOGASTRODUODENOSCOPY (EGD) WITH PROPOFOL  N/A 08/12/2017   Procedure: ESOPHAGOGASTRODUODENOSCOPY (EGD) WITH PROPOFOL ;  Surgeon:  Jinny Carmine, MD;  Location: [facility];  Service: Endoscopy;  Laterality: N/A;  CPAP   FOOT SURGERY     multiple   SEPTOPLASTY     SHOULDER ARTHROSCOPY Bilateral 2008   TONSILLECTOMY  6   TUBAL LIGATION     URETEROSCOPY WITH HOLMIUM LASER LITHOTRIPSY Left 10/26/2016   Procedure: URETEROSCOPY WITH HOLMIUM LASER LITHOTRIPSY;  Surgeon: Rosina Riis, MD;  Location: [facility];  Service: Urology;  Laterality: Left;   VAGINAL HYSTERECTOMY  1989   cervical cancer age 57   Patient Active Problem List   Diagnosis Date Noted   Nausea and vomiting    OSA (obstructive sleep apnea) 02/08/2017   Vitamin D  deficiency 07/21/2016   Hx of suicide attempt 07/19/2016   PTSD (post-traumatic stress disorder) 07/19/2016   Migraine headache 07/19/2016   RLS (restless legs syndrome) 07/01/2016   Osteoporosis  07/01/2016   Trochanteric bursitis 03/17/2016   Prediabetes 03/17/2016   Gastritis    Pituitary adenoma (HCC) 10/02/2015   Borderline personality disorder (HCC) 02/10/2015   Chronic pain syndrome 02/10/2015   Arthritis 02/10/2015   Hx of colonic polyps    Seasonal allergies 12/21/2013   Fibromyalgia 12/21/2013   Moderate persistent asthma without complication 06/20/2013   Chronic nausea 06/20/2013   Bipolar affective disorder (HCC) 06/20/2013   Hx of cervical cancer 07/20/2012   Lumbar herniated disc 07/20/2012   Hyperlipidemia 07/20/2012   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 Dann, MD  REFERRING PROVIDER: Dr. Jesus  REFERRING DIAG:  Diagnosis  M54.31 (ICD-10-CM) - Sciatica, right side  M50.30 (ICD-10-CM) - Other cervical disc degeneration, unspecified cervical region  Z91.81 (ICD-10-CM) - History of falling    Rationale for Evaluation and Treatment: Rehabilitation  THERAPY DIAG:  No diagnosis found.  ONSET DATE: August 2025  SUBJECTIVE:                                                                                                                                                                                           SUBJECTIVE STATEMENT: Pt's main concern is she feels weak after multiple health concerns this year and multiple hospital stays (most recent in Aug 2025 for respiratory issues; per char review- recently returned to the hospital due to acute hypoxemic respiratory failure with worsening dyspnea and asthma exacerbation. )  Just had a course of home health PT- finished before outpatient PT   Started medications for breathing- those are helping; starts injection therapy this Friday (Dupixent)  Walking with a SPC, sometimes walker, and sometimes no cane.   Most recently had a fall 2 weeks ago- physical fatigue  she feels like contributes to it  Difficulty standing stationary (bothers lower back)  Does some walking 100 m  driveway, 4x wears a mask for this, feels SOB sometimes    PERTINENT HISTORY:  Just finished home health PT Was hospitalized for respiratory failure in August 2025 Has h/o low back pain- L3-4  Has had shoulder surgery b/l  PAIN:  Are you having pain? Currently 0/10 and at worst 9-10/10  PRECAUTIONS: Fall  RED FLAGS: None   WEIGHT BEARING RESTRICTIONS: No  FALLS:  Has patient fallen in last 6 months? Yes. Number of falls >3  LIVING ENVIRONMENT: Lives with: lives alone Lives in: Mobile home Stairs: 6 steps to enter/exit Has following equipment at home: Single point cane  Has family/sister support nearby where she lives   OCCUPATION: retired; enjoys environmental manager, does puzzles  PLOF: Independent  PATIENT GOALS: to be able to walk, able to hike, able to walk [place] (beach trip planned for October 2025), able to travel again  NEXT MD VISIT: End of September   OBJECTIVE:  Note: Objective measures were completed at Evaluation unless otherwise noted.  DIAGNOSTIC FINDINGS: See chart review  PATIENT SURVEYS:  LEFS 40/80  COGNITION: Overall cognitive status: Within functional limits for tasks assessed     SENSATION: WFL   POSTURE: rounded shoulders   LUMBAR ROM: deferred  AROM eval  Flexion   Extension   Right lateral flexion   Left lateral flexion   Right rotation   Left rotation    (Blank rows = not tested)  LOWER EXTREMITY ROM:   deferred; pt able to transfer sit to stand  Active  Right eval Left eval  Hip flexion    Hip extension    Hip abduction    Hip adduction    Hip internal rotation    Hip external rotation    Knee flexion    Knee extension    Ankle dorsiflexion    Ankle plantarflexion    Ankle inversion    Ankle eversion     (Blank rows = not tested)  LOWER EXTREMITY MMT:    MMT Right eval Left eval  Hip flexion 4 4  Hip extension    Hip abduction    Hip adduction    Hip internal rotation    Hip external rotation     Knee flexion 4 4  Knee extension 4 4  Ankle dorsiflexion 4 4  Ankle plantarflexion    Ankle inversion    Ankle eversion     (Blank rows = not tested)   FUNCTIONAL TESTS:  5 times sit to stand: 9 seconds today   SLS: 3-5 seconds each LE   TUG 11 seconds today GAIT: Pt amb with SPC in clinic today  TREATMENT DATE: 05/19/24 Subjective:  Pt had 1 fall; no injury occurred.  She has worked on her HEP.  Had a tough week with death in family.  Did a walk this morning before PT.  Objective: Goals updated: see below LEFS: 30   Manual Therapy: not today (+) pain AROM= PROM limitation for R hip flexion and ER compared to L, (+) limited R hip IR and painful AROM>PROM R LE long axis traction (modified in hooklying today): 10x MRE contract/relax hip ER R x 10   Therapeutic Exercise:  Nustep level 3 x 6 minutes, seat #9 SKTC x 5 with PT ea Hooklying LTR x 5 ea SLR with PT assist x 10 ea Seated marches x 10 ea- not today  LAQ: 3# 2x10 ea Hip abd with blue TB: 5 second holds x 20 Hip add with ball (sitting): 5 second holds x 20 Bridges: 2x5 partial ROM  Therapeutic Activities: Squats with chair behind: x 10, 2 sets Amb in hallway: with SPC, 1 lap Sit to stand x 5 Standing side stepping in parallel bars 5 laps, over hurdles Tandem stance on airex:4x 20 sec Feet together on airex: 1 min Tandem walk x 3 laps Front step ups: x10 R x10 L (6 inch)  Neuro re-ed: not today (+) R SLR for reproduction of sx Sciatic n glides x10, 2 sets   Not today: Sit to stand x 5, 2 sets with 5 lb weight (front squat) Heel raises x 10, 2 sets Standing hamstring curls: 3# 2x10 ea Standing hip abd: 3# x10 ea, 2 sets Front squats: 7 # weight, 2x8- not today Goblet squats: weight to/from 6 inch box x 10- not today SL balance: R and L LE x 10-12 second intervals in parallel bars- not today Front step up intervals: 30 seconds on 6 inch step, x3- not today 6 min walk test: able to complete 3:45  minutes (8x68ft total=560 ft) then needed a sitting break due to R hip mm fatigue; test ended- not today   PATIENT EDUCATION:  Education details: HEP, PT POC/goals Person educated: Patient Education method: Explanation Education comprehension: verbalized understanding  HOME EXERCISE PROGRAM: To be formally initiated at visit #2, encouraged pt to continue with current HEP and add front step up intervals to HEP, 3x 30 sec after her walks for LE strength/standing endurance  ASSESSMENT:  CLINICAL IMPRESSION: Patient arrived motivated to participate in today's session.  Was challenged with LE strengthening.  Presents with deconditioning, muscle weakness, h/o lumbar radiculopathy, history of recent falls, decreased balance, and decreased activity tolerance.  She is making progress towards PT goals.  Would benefit from continued skilled PT interventions.  She is an appropriate candidate for outpatient PT to address impairments below and to facilitate improved activity tolerance, improved safety during amb, and improved QOL.   OBJECTIVE IMPAIRMENTS: decreased activity tolerance, decreased balance, difficulty walking, decreased strength, impaired perceived functional ability, and pain.   ACTIVITY LIMITATIONS: standing, squatting, stairs, transfers, and locomotion level  PARTICIPATION LIMITATIONS: meal prep, cleaning, laundry, shopping, community activity, and yard work  PERSONAL FACTORS: Past/current experiences, Time since onset of injury/illness/exacerbation, and 1-2 comorbidities: see PMH are also affecting patient's functional outcome.   REHAB POTENTIAL: Good  CLINICAL DECISION MAKING: Evolving/moderate complexity  EVALUATION COMPLEXITY: Moderate   GOALS: Goals reviewed with patient? Yes  SHORT TERM GOALS: Target date: 04/05/24  Complete 6 min walk test to establish baseline endurance for amb  Baseline:at visit #2; 11/14: has completed 2-3 min walking intervals in clinic Goal status:  IN progress   LONG TERM GOALS: Target date: 06/13/24  Improve LEFS >10 points indicating pt is less significantly limited in her ability to perform her daily activities Baseline: 40; 11/14: 30 Goal status: IN progress  2.  Pt will be able to amb on even/uneven surfaces x 15 min without AD to be able to go on her beach trip and walk to/from beach at end of October Baseline: [facility] used for amb on flat surfaces; 11/14: using SPC out of home, or holding onto shopping cart Goal status: IN progress  3.  Pt will be able to perform >20 min consecutive standing housechores to complete her housework/meal prep/laundry without being limited by fatigue/SOB Baseline: 10 min intervals; 11/14: limited by  LBP 4-5/10 Goal status: IN progress    PLAN:  PT FREQUENCY: 2x/week  PT DURATION: 8 weeks  PLANNED INTERVENTIONS: 97110-Therapeutic exercises, 97530- Therapeutic activity, [id number]- Neuromuscular re-education, 97535- Self Care, [id number]- Manual therapy, and Patient/Family education.  PLAN FOR NEXT SESSION: continue with tx emphasizing endurance/conditioning, LE strengthening, stair intervals  Vernell Reges, PT, DPT, OCS  Vernell FORBES Reges, PT 05/19/2024, 8:40 AM

## 2024-05-19 NOTE — Progress Notes (Signed)
 This pharmacist was notified by a technician that this patient has reported that they've missed 1 doses of their Dupixent.. I have reviewed the patient's medical record and have determined that no further pharmacist action is needed. Technician reported that patient thinks mother threw out dose when cleaning fridge.    Approximate time spent: 0-5 minutes  Shelba DELENA Hummer, PharmD, Clinical Specialty Pharmacist [facility]

## 2024-05-22 ENCOUNTER — Ambulatory Visit

## 2024-05-24 ENCOUNTER — Ambulatory Visit

## 2024-05-29 ENCOUNTER — Ambulatory Visit

## 2024-05-29 DIAGNOSIS — R296 Repeated falls: Secondary | ICD-10-CM

## 2024-05-29 DIAGNOSIS — R5381 Other malaise: Secondary | ICD-10-CM

## 2024-05-29 DIAGNOSIS — M6281 Muscle weakness (generalized): Secondary | ICD-10-CM | POA: Diagnosis not present

## 2024-05-29 DIAGNOSIS — R262 Difficulty in walking, not elsewhere classified: Secondary | ICD-10-CM

## 2024-05-29 NOTE — Addendum Note (Signed)
 Addended by: Yeiden Frenkel E on: 05/29/2024 09:52 AM   Modules accepted: Orders

## 2024-05-29 NOTE — Therapy (Signed)
 OUTPATIENT PHYSICAL THERAPY THORACOLUMBAR Treatment/Progress Note/Re-cert   Patient Name: Brandi Bell MRN: [id number] DOB:03/05/1961, 63 y.o., female Today's Date: 05/29/2024  END OF SESSION:  PT End of Session - 05/29/24 0903     Visit Number 10    Number of Visits 17    Date for Recertification  06/13/24    Authorization Type request 2x/week x 8 weeks (Medicare PN needed at visit #10)    PT Start Time 0900    PT Stop Time 0945    PT Time Calculation (min) 45 min    Activity Tolerance Patient tolerated treatment well    Behavior During Therapy WFL for tasks assessed/performed           Past Medical History:  Diagnosis Date   Anemia    hx of   Anxiety    Arthritis    osteo Back, Feet, Hands   Asthma    [facility] admission - on Bipap - 7/13 - report on paper chart/ flare 3 mos ago   Bipolar disorder (HCC)    Bipolar disorder (HCC)    Bulging lumbar disc    Cancer (HCC)    cervical   Cataract    Depression    Elevated LFTs    having ultrasound [facility] - 12/14/14   GERD (gastroesophageal reflux disease)    vomiting   Headache    migraines, every three or 4 days/ stress related   Heart attack (HCC)    hx of/ Dr. 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, self repaired   History of kidney stones    History of migraine headaches    Hypertension    Echo -1/16 - report on paper chart/controlled on meds   IBS (irritable bowel syndrome)    Microhematuria    Motion sickness    Osteoporosis    Pneumonia    hx of   PONV (postoperative nausea and vomiting)    PTSD (post-traumatic stress disorder)    Restless leg syndrome    Rhinitis    Shortness of breath dyspnea    with exertion   Sleep apnea    central and obstructive/ CPAP   Spinal stenosis of cervical region    Suicide attempt [facility])    Vertigo 6 yrs ago   hx of   Past Surgical History:  Procedure Laterality Date   ANTERIOR AND POSTERIOR REPAIR  2002   done with bladder tact    BILATERAL SALPINGOOPHORECTOMY  2004   pain from scar tissue   CARDIAC CATHETERIZATION  2008   [facility] - neg - report in paper chart   CATARACT EXTRACTION Bilateral 2007   CHOLECYSTECTOMY     COLONOSCOPY N/A 01/04/2015   Procedure: COLONOSCOPY;  Surgeon: Rogelia Copping, MD;  Location: [facility];  Service: Gastroenterology;  Laterality: N/A;  with biopsies   CYSTOSCOPY W/ RETROGRADES Right 10/26/2016   Procedure: CYSTOSCOPY WITH RETROGRADE PYELOGRAM;  Surgeon: Rosina Riis, MD;  Location: [facility];  Service: Urology;  Laterality: Right;   CYSTOSCOPY WITH STENT PLACEMENT Left 10/26/2016   Procedure: CYSTOSCOPY WITH STENT PLACEMENT;  Surgeon: Rosina Riis, MD;  Location: [facility];  Service: Urology;  Laterality: Left;   ESOPHAGOGASTRODUODENOSCOPY (EGD) WITH PROPOFOL  N/A 12/30/2015   Procedure: ESOPHAGOGASTRODUODENOSCOPY (EGD) WITH PROPOFOL ;  Surgeon: Rogelia Copping, MD;  Location: [facility];  Service: Endoscopy;  Laterality: N/A;   ESOPHAGOGASTRODUODENOSCOPY (EGD) WITH PROPOFOL  N/A 08/12/2017   Procedure: ESOPHAGOGASTRODUODENOSCOPY (EGD) WITH PROPOFOL ;  Surgeon: Copping Rogelia,  MD;  Location: [facility];  Service: Endoscopy;  Laterality: N/A;  CPAP   FOOT SURGERY     multiple   SEPTOPLASTY     SHOULDER ARTHROSCOPY Bilateral 2008   TONSILLECTOMY  6   TUBAL LIGATION     URETEROSCOPY WITH HOLMIUM LASER LITHOTRIPSY Left 10/26/2016   Procedure: URETEROSCOPY WITH HOLMIUM LASER LITHOTRIPSY;  Surgeon: Rosina Riis, MD;  Location: [facility];  Service: Urology;  Laterality: Left;   VAGINAL HYSTERECTOMY  1989   cervical cancer age 56   Patient Active Problem List   Diagnosis Date Noted   Nausea and vomiting    OSA (obstructive sleep apnea) 02/08/2017   Vitamin D  deficiency 07/21/2016   Hx of suicide attempt 07/19/2016   PTSD (post-traumatic stress disorder) 07/19/2016   Migraine headache 07/19/2016   RLS (restless legs syndrome) 07/01/2016   Osteoporosis 07/01/2016    Trochanteric bursitis 03/17/2016   Prediabetes 03/17/2016   Gastritis    Pituitary adenoma (HCC) 10/02/2015   Borderline personality disorder (HCC) 02/10/2015   Chronic pain syndrome 02/10/2015   Arthritis 02/10/2015   Hx of colonic polyps    Seasonal allergies 12/21/2013   Fibromyalgia 12/21/2013   Moderate persistent asthma without complication 06/20/2013   Chronic nausea 06/20/2013   Bipolar affective disorder (HCC) 06/20/2013   Hx of cervical cancer 07/20/2012   Lumbar herniated disc 07/20/2012   Hyperlipidemia 07/20/2012   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 Dann, MD  REFERRING PROVIDER: Dr. Jesus  REFERRING DIAG:  Diagnosis  M54.31 (ICD-10-CM) - Sciatica, right side  M50.30 (ICD-10-CM) - Other cervical disc degeneration, unspecified cervical region  Z91.81 (ICD-10-CM) - History of falling    Rationale for Evaluation and Treatment: Rehabilitation  THERAPY DIAG:  Muscle weakness (generalized)  Physical deconditioning  Repeated falls  Difficulty in walking, not elsewhere classified  ONSET DATE: August 2025  SUBJECTIVE:                                                                                                                                                                                           SUBJECTIVE STATEMENT: Pt's main concern is she feels weak after multiple health concerns this year and multiple hospital stays (most recent in Aug 2025 for respiratory issues; per char review- recently returned to the hospital due to acute hypoxemic respiratory failure with worsening dyspnea and asthma exacerbation. )  Just had a course of home health PT- finished before outpatient PT   Started medications for breathing- those are helping; starts injection therapy this Friday (Dupixent)  Walking with a SPC, sometimes walker, and sometimes no cane.  Most recently had a fall 2 weeks ago- physical fatigue she feels like  contributes to it  Difficulty standing stationary (bothers lower back)  Does some walking 100 m driveway, 4x wears a mask for this, feels SOB sometimes    PERTINENT HISTORY:  Just finished home health PT Was hospitalized for respiratory failure in August 2025 Has h/o low back pain- L3-4  Has had shoulder surgery b/l  PAIN:  Are you having pain? Currently 0/10 and at worst 9-10/10  PRECAUTIONS: Fall  RED FLAGS: None   WEIGHT BEARING RESTRICTIONS: No  FALLS:  Has patient fallen in last 6 months? Yes. Number of falls >3  LIVING ENVIRONMENT: Lives with: lives alone Lives in: Mobile home Stairs: 6 steps to enter/exit Has following equipment at home: Single point cane  Has family/sister support nearby where she lives   OCCUPATION: retired; enjoys environmental manager, does puzzles  PLOF: Independent  PATIENT GOALS: to be able to walk, able to hike, able to walk [place] (beach trip planned for October 2025), able to travel again  NEXT MD VISIT: End of September   OBJECTIVE:  Note: Objective measures were completed at Evaluation unless otherwise noted.  DIAGNOSTIC FINDINGS: See chart review  PATIENT SURVEYS:  LEFS 40/80  COGNITION: Overall cognitive status: Within functional limits for tasks assessed     SENSATION: WFL   POSTURE: rounded shoulders   LUMBAR ROM: deferred  AROM eval  Flexion   Extension   Right lateral flexion   Left lateral flexion   Right rotation   Left rotation    (Blank rows = not tested)  LOWER EXTREMITY ROM:   deferred; pt able to transfer sit to stand  Active  Right eval Left eval  Hip flexion    Hip extension    Hip abduction    Hip adduction    Hip internal rotation    Hip external rotation    Knee flexion    Knee extension    Ankle dorsiflexion    Ankle plantarflexion    Ankle inversion    Ankle eversion     (Blank rows = not tested)  LOWER EXTREMITY MMT:    MMT Right eval Left eval  Hip flexion 4 4   Hip extension    Hip abduction    Hip adduction    Hip internal rotation    Hip external rotation    Knee flexion 4 4  Knee extension 4 4  Ankle dorsiflexion 4 4  Ankle plantarflexion    Ankle inversion    Ankle eversion     (Blank rows = not tested)   FUNCTIONAL TESTS:  5 times sit to stand: 9 seconds today   SLS: 3-5 seconds each LE   TUG 11 seconds today GAIT: Pt amb with SPC in clinic today  TREATMENT DATE: 05/29/24 Subjective:  Pt has not had any falls.  She has worked on her HEP.  Has MRI for R hip this evening.  Has been taking care of her mental health the past week.  Saw her psychiatrist this week.    Objective: Goals updated: see below LEFS: 30  Manual Therapy: not today (+) pain AROM= PROM limitation for R hip flexion and ER compared to L, (+) limited R hip IR and painful AROM>PROM R LE long axis traction (modified in hooklying today): 10x MRE contract/relax hip ER R x 10   Therapeutic Exercise:  Nustep level 3 x 10 minutes, seat #7 SKTC x 5 with PT ea-  not today Hooklying LTR x 5 ea- not today SLR with PT assist x 10 ea- not today Seated marches x 10 ea- not today LAQ: 5# 2x10 ea Hip abd with blue TB: 5 second holds x 20 Hip add with ball (sitting): 5 second holds x 20 Bridges: 2x5 partial ROM- not today Seated lumbar flexion x5, 2 sets  Vitals check during session: when pt started feeling a little unsteady BP 125/78 (seated L arm) HR 79 bpm  Therapeutic Activities: Squats with chair behind: x 10, 2 sets Amb in hallway: with SPC, 1 lap Sit to stand x 10, 2 sets Standing side stepping in parallel bars 5 laps, over hurdles Tandem stance on airex:4x 20 sec- not today Feet together on airex: 1 min- not today Tandem walk x 3 laps- not today Front step ups: x10 R x10 L (6 inch) Heel raises 2x10 Standing hip abd 2x10 ea   Neuro re-ed: not today (+) R SLR for reproduction of sx Sciatic n glides x10, 2 sets  Not today 6 min walk test: able to  complete 3:45 minutes (8x21ft total=560 ft) then needed a sitting break due to R hip mm fatigue; test ended- not today   PATIENT EDUCATION:  Education details: HEP, PT POC/goals Person educated: Patient Education method: Explanation Education comprehension: verbalized understanding  HOME EXERCISE PROGRAM: To be formally initiated at visit #2, encouraged pt to continue with current HEP and add front step up intervals to HEP, 3x 30 sec after her walks for LE strength/standing endurance  ASSESSMENT:  CLINICAL IMPRESSION: Patient arrived motivated to participate in today's session.  Was challenged with LE strengthening.  Vital signs are within safe ranges for exercising today.  Presents with deconditioning, muscle weakness, h/o lumbar radiculopathy, history of recent falls, decreased balance, and decreased activity tolerance.  Would benefit from continued skilled PT interventions.  She is an appropriate candidate for outpatient PT to address impairments below and to facilitate improved activity tolerance, improved safety during amb, and improved QOL.   OBJECTIVE IMPAIRMENTS: decreased activity tolerance, decreased balance, difficulty walking, decreased strength, impaired perceived functional ability, and pain.   ACTIVITY LIMITATIONS: standing, squatting, stairs, transfers, and locomotion level  PARTICIPATION LIMITATIONS: meal prep, cleaning, laundry, shopping, community activity, and yard work  PERSONAL FACTORS: Past/current experiences, Time since onset of injury/illness/exacerbation, and 1-2 comorbidities: see PMH are also affecting patient's functional outcome.   REHAB POTENTIAL: Good  CLINICAL DECISION MAKING: Evolving/moderate complexity  EVALUATION COMPLEXITY: Moderate   GOALS: Goals reviewed with patient? Yes  SHORT TERM GOALS: Target date: 04/05/24  Complete 6 min walk test to establish baseline endurance for amb  Baseline:at visit #2 Goal status: INITIAL   LONG TERM  GOALS: Target date: 05/16/24  Improve LEFS >10 points indicating pt is less significantly limited in her ability to perform her daily activities Baseline: 40 Goal status: INITIAL  2.  Pt will be able to amb on even/uneven surfaces x 15 min without AD to be able to go on her beach trip and walk to/from beach at end of October Baseline: SPC used for amb on flat surfaces Goal status: INITIAL  3.  Pt will be able to perform >20 min consecutive standing housechores to complete her housework/meal prep/laundry without being limited by fatigue/SOB Baseline: 10 min intervals Goal status: INITIAL    PLAN:  PT FREQUENCY: 2x/week  PT DURATION: 8 weeks  PLANNED INTERVENTIONS: 97110-Therapeutic exercises, 97530- Therapeutic activity, [id number]- Neuromuscular re-education, 97535- Self Care, [id number]- Manual therapy, and Patient/Family education.  PLAN FOR NEXT SESSION: continue with tx emphasizing endurance/conditioning, LE strengthening, stair intervals  Vernell Reges, PT, DPT, OCS  Vernell FORBES Reges, PT 05/29/2024, 9:07 AM

## 2024-05-31 ENCOUNTER — Ambulatory Visit

## 2024-05-31 DIAGNOSIS — R262 Difficulty in walking, not elsewhere classified: Secondary | ICD-10-CM

## 2024-05-31 DIAGNOSIS — R296 Repeated falls: Secondary | ICD-10-CM

## 2024-05-31 DIAGNOSIS — R5381 Other malaise: Secondary | ICD-10-CM

## 2024-05-31 DIAGNOSIS — M6281 Muscle weakness (generalized): Secondary | ICD-10-CM

## 2024-05-31 NOTE — Therapy (Signed)
 OUTPATIENT PHYSICAL THERAPY THORACOLUMBAR Treatment   Patient Name: Brandi Bell MRN: [id number] DOB:Jan 02, 1961, 63 y.o., female Today's Date: 05/31/2024  END OF SESSION:  PT End of Session - 05/31/24 0903     Visit Number 11    Number of Visits 17    Date for Recertification  06/13/24    Authorization Type request 2x/week x 8 weeks (Medicare PN needed at visit #10)    PT Start Time 0902    PT Stop Time 0945    PT Time Calculation (min) 43 min    Activity Tolerance Patient tolerated treatment well    Behavior During Therapy WFL for tasks assessed/performed           Past Medical History:  Diagnosis Date   Anemia    hx of   Anxiety    Arthritis    osteo Back, Feet, Hands   Asthma    [facility] admission - on Bipap - 7/13 - report on paper chart/ flare 3 mos ago   Bipolar disorder (HCC)    Bipolar disorder (HCC)    Bulging lumbar disc    Cancer (HCC)    cervical   Cataract    Depression    Elevated LFTs    having ultrasound [facility] - 12/14/14   GERD (gastroesophageal reflux disease)    vomiting   Headache    migraines, every three or 4 days/ stress related   Heart attack (HCC)    hx of/ Dr. 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, self repaired   History of kidney stones    History of migraine headaches    Hypertension    Echo -1/16 - report on paper chart/controlled on meds   IBS (irritable bowel syndrome)    Microhematuria    Motion sickness    Osteoporosis    Pneumonia    hx of   PONV (postoperative nausea and vomiting)    PTSD (post-traumatic stress disorder)    Restless leg syndrome    Rhinitis    Shortness of breath dyspnea    with exertion   Sleep apnea    central and obstructive/ CPAP   Spinal stenosis of cervical region    Suicide attempt [facility])    Vertigo 6 yrs ago   hx of   Past Surgical History:  Procedure Laterality Date   ANTERIOR AND POSTERIOR REPAIR  2002   done with bladder tact   BILATERAL  SALPINGOOPHORECTOMY  2004   pain from scar tissue   CARDIAC CATHETERIZATION  2008   [facility] - neg - report in paper chart   CATARACT EXTRACTION Bilateral 2007   CHOLECYSTECTOMY     COLONOSCOPY N/A 01/04/2015   Procedure: COLONOSCOPY;  Surgeon: Rogelia Copping, MD;  Location: [facility];  Service: Gastroenterology;  Laterality: N/A;  with biopsies   CYSTOSCOPY W/ RETROGRADES Right 10/26/2016   Procedure: CYSTOSCOPY WITH RETROGRADE PYELOGRAM;  Surgeon: Rosina Riis, MD;  Location: [facility];  Service: Urology;  Laterality: Right;   CYSTOSCOPY WITH STENT PLACEMENT Left 10/26/2016   Procedure: CYSTOSCOPY WITH STENT PLACEMENT;  Surgeon: Rosina Riis, MD;  Location: [facility];  Service: Urology;  Laterality: Left;   ESOPHAGOGASTRODUODENOSCOPY (EGD) WITH PROPOFOL  N/A 12/30/2015   Procedure: ESOPHAGOGASTRODUODENOSCOPY (EGD) WITH PROPOFOL ;  Surgeon: Rogelia Copping, MD;  Location: [facility];  Service: Endoscopy;  Laterality: N/A;   ESOPHAGOGASTRODUODENOSCOPY (EGD) WITH PROPOFOL  N/A 08/12/2017   Procedure: ESOPHAGOGASTRODUODENOSCOPY (EGD) WITH PROPOFOL ;  Surgeon: Copping Rogelia, MD;  Location: [facility];  Service: Endoscopy;  Laterality: N/A;  CPAP   FOOT SURGERY     multiple   SEPTOPLASTY     SHOULDER ARTHROSCOPY Bilateral 2008   TONSILLECTOMY  6   TUBAL LIGATION     URETEROSCOPY WITH HOLMIUM LASER LITHOTRIPSY Left 10/26/2016   Procedure: URETEROSCOPY WITH HOLMIUM LASER LITHOTRIPSY;  Surgeon: Rosina Riis, MD;  Location: [facility];  Service: Urology;  Laterality: Left;   VAGINAL HYSTERECTOMY  1989   cervical cancer age 61   Patient Active Problem List   Diagnosis Date Noted   Nausea and vomiting    OSA (obstructive sleep apnea) 02/08/2017   Vitamin D  deficiency 07/21/2016   Hx of suicide attempt 07/19/2016   PTSD (post-traumatic stress disorder) 07/19/2016   Migraine headache 07/19/2016   RLS (restless legs syndrome) 07/01/2016   Osteoporosis 07/01/2016   Trochanteric bursitis  03/17/2016   Prediabetes 03/17/2016   Gastritis    Pituitary adenoma (HCC) 10/02/2015   Borderline personality disorder (HCC) 02/10/2015   Chronic pain syndrome 02/10/2015   Arthritis 02/10/2015   Hx of colonic polyps    Seasonal allergies 12/21/2013   Fibromyalgia 12/21/2013   Moderate persistent asthma without complication 06/20/2013   Chronic nausea 06/20/2013   Bipolar affective disorder (HCC) 06/20/2013   Hx of cervical cancer 07/20/2012   Lumbar herniated disc 07/20/2012   Hyperlipidemia 07/20/2012   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 Dann, MD  REFERRING PROVIDER: Dr. Jesus  REFERRING DIAG:  Diagnosis  M54.31 (ICD-10-CM) - Sciatica, right side  M50.30 (ICD-10-CM) - Other cervical disc degeneration, unspecified cervical region  Z91.81 (ICD-10-CM) - History of falling    Rationale for Evaluation and Treatment: Rehabilitation  THERAPY DIAG:  Muscle weakness (generalized)  Physical deconditioning  Repeated falls  Difficulty in walking, not elsewhere classified  ONSET DATE: August 2025  SUBJECTIVE:                                                                                                                                                                                           SUBJECTIVE STATEMENT: Pt's main concern is she feels weak after multiple health concerns this year and multiple hospital stays (most recent in Aug 2025 for respiratory issues; per char review- recently returned to the hospital due to acute hypoxemic respiratory failure with worsening dyspnea and asthma exacerbation. )  Just had a course of home health PT- finished before outpatient PT   Started medications for breathing- those are helping; starts injection therapy this Friday (Dupixent)  Walking with a SPC, sometimes walker, and sometimes no cane.   Most  recently had a fall 2 weeks ago- physical fatigue she feels like contributes to  it  Difficulty standing stationary (bothers lower back)  Does some walking 100 m driveway, 4x wears a mask for this, feels SOB sometimes    PERTINENT HISTORY:  Just finished home health PT Was hospitalized for respiratory failure in August 2025 Has h/o low back pain- L3-4  Has had shoulder surgery b/l  PAIN:  Are you having pain? Currently 0/10 and at worst 9-10/10  PRECAUTIONS: Fall  RED FLAGS: None   WEIGHT BEARING RESTRICTIONS: No  FALLS:  Has patient fallen in last 6 months? Yes. Number of falls >3  LIVING ENVIRONMENT: Lives with: lives alone Lives in: Mobile home Stairs: 6 steps to enter/exit Has following equipment at home: Single point cane  Has family/sister support nearby where she lives   OCCUPATION: retired; enjoys environmental manager, does puzzles  PLOF: Independent  PATIENT GOALS: to be able to walk, able to hike, able to walk [place] (beach trip planned for October 2025), able to travel again  NEXT MD VISIT: End of September   OBJECTIVE:  Note: Objective measures were completed at Evaluation unless otherwise noted.  DIAGNOSTIC FINDINGS: See chart review  PATIENT SURVEYS:  LEFS 40/80  COGNITION: Overall cognitive status: Within functional limits for tasks assessed     SENSATION: WFL   POSTURE: rounded shoulders   LUMBAR ROM: deferred  AROM eval  Flexion   Extension   Right lateral flexion   Left lateral flexion   Right rotation   Left rotation    (Blank rows = not tested)  LOWER EXTREMITY ROM:   deferred; pt able to transfer sit to stand  Active  Right eval Left eval  Hip flexion    Hip extension    Hip abduction    Hip adduction    Hip internal rotation    Hip external rotation    Knee flexion    Knee extension    Ankle dorsiflexion    Ankle plantarflexion    Ankle inversion    Ankle eversion     (Blank rows = not tested)  LOWER EXTREMITY MMT:    MMT Right eval Left eval  Hip flexion 4 4  Hip extension     Hip abduction    Hip adduction    Hip internal rotation    Hip external rotation    Knee flexion 4 4  Knee extension 4 4  Ankle dorsiflexion 4 4  Ankle plantarflexion    Ankle inversion    Ankle eversion     (Blank rows = not tested)   FUNCTIONAL TESTS:  5 times sit to stand: 9 seconds today   SLS: 3-5 seconds each LE   TUG 11 seconds today GAIT: Pt amb with SPC in clinic today  TREATMENT DATE: 05/31/24 Subjective:  Pt had full workup done in ER after a Fall on 11/24- R leg gave out, she does not know what happened.  She is sore today in R shoulder, hip, back, and neck feels stiff.  All x-rays and CT scans were (-) for any fx or major new pathologies.  She is using a rollator today to amb.  Objective: Goals updated: see below LEFS: 30  Manual Therapy: not today (+) pain AROM= PROM limitation for R hip flexion and ER compared to L, (+) limited R hip IR and painful AROM>PROM R LE long axis traction (modified in hooklying today): 10x MRE contract/relax hip ER R x 10  Therapeutic Exercise: modified today Nustep level 3 x 10 minutes, seat #7- not today SKTC x 5 with PT ea- not today Hooklying LTR x 5 ea- not today SLR with PT assist x 10 ea- not today Seated marches x 10 ea- not today LAQ: 3# 2x10 ea Hip abd with blue TB: 5 second holds x 20 Hip add with ball (sitting): 5 second holds x 20 Bridges: 2x5 partial ROM- not today Seated lumbar flexion x5, 2 sets- not today  Gentle AROM within available ROM for R shoulder flexion x 10 and abd x10 and ER/IR x10; elbow flex/extension x10, cervical rotation R/L x10 Scapular retraction x 10 Diaphragmatic breathing x10  Vitals check during session: when pt started feeling a little unsteady BP 125/78 (seated L arm) HR 79 bpm  Therapeutic Activities: modified today Squats with chair behind: x 10, 2 sets- not today Amb in hallway: with SPC, 1 lap- not today Sit to stand x 10, 2 sets Standing side stepping in parallel bars 5  laps, over hurdles- not today Tandem stance on airex:4x 20 sec- not today Feet together on airex: 1 min- not today Tandem walk x 3 laps- not today Front step ups: x10 R x10 L (6 inch)- not today Heel raises 2x10- not today Standing hip abd 2x10 ea- not today   Self care: pt's rollator she is using does not have functioning breaks.  Attempted to tighten brake cable with allen wrench but this still did not fix the issue.  Discussed using her standard walker as this is safer bc this one has 4 wheels and no ability to break if on decline/inclined surface.    Neuro re-ed: not today (+) R SLR for reproduction of sx Sciatic n glides x10, 2 sets  Not today 6 min walk test: able to complete 3:45 minutes (8x13ft total=560 ft) then needed a sitting break due to R hip mm fatigue; test ended- not today   PATIENT EDUCATION:  Education details: HEP, PT POC/goals Person educated: Patient Education method: Explanation Education comprehension: verbalized understanding  HOME EXERCISE PROGRAM: To be formally initiated at visit #2, encouraged pt to continue with current HEP and add front step up intervals to HEP, 3x 30 sec after her walks for LE strength/standing endurance  ASSESSMENT:  CLINICAL IMPRESSION: Patient arrived motivated to participate in today's session, and was also very tearful and expresses fear, anxiety, sadness regarding her fall this week stating she doesn't like feeling like she is going backwards.  Modified tx session today to focus on gentle AROM and functional movements.  Attempted to adjust her rollator walker but was not able to fix the break issues.  Overall participation in tx today limited by anxiety.  Would benefit from continued skilled PT interventions.  She is an appropriate candidate for outpatient PT to address impairments below and to facilitate improved activity tolerance, improved safety during amb, and improved QOL.   OBJECTIVE IMPAIRMENTS: decreased activity  tolerance, decreased balance, difficulty walking, decreased strength, impaired perceived functional ability, and pain.   ACTIVITY LIMITATIONS: standing, squatting, stairs, transfers, and locomotion level  PARTICIPATION LIMITATIONS: meal prep, cleaning, laundry, shopping, community activity, and yard work  PERSONAL FACTORS: Past/current experiences, Time since onset of injury/illness/exacerbation, and 1-2 comorbidities: see PMH are also affecting patient's functional outcome.   REHAB POTENTIAL: Good  CLINICAL DECISION MAKING: Evolving/moderate complexity  EVALUATION COMPLEXITY: Moderate   GOALS: Goals reviewed with patient? Yes  SHORT TERM GOALS: Target date: 04/05/24  Complete 6 min walk test to establish  baseline endurance for amb  Baseline:at visit #2 Goal status: INITIAL   LONG TERM GOALS: Target date: 05/16/24  Improve LEFS >10 points indicating pt is less significantly limited in her ability to perform her daily activities Baseline: 40 Goal status: INITIAL  2.  Pt will be able to amb on even/uneven surfaces x 15 min without AD to be able to go on her beach trip and walk to/from beach at end of October Baseline: SPC used for amb on flat surfaces Goal status: INITIAL  3.  Pt will be able to perform >20 min consecutive standing housechores to complete her housework/meal prep/laundry without being limited by fatigue/SOB Baseline: 10 min intervals Goal status: INITIAL    PLAN:  PT FREQUENCY: 2x/week  PT DURATION: 8 weeks  PLANNED INTERVENTIONS: 97110-Therapeutic exercises, 97530- Therapeutic activity, [id number]- Neuromuscular re-education, 97535- Self Care, 02859- Manual therapy, and Patient/Family education.  PLAN FOR NEXT SESSION: continue with tx emphasizing endurance/conditioning, LE strengthening, stair intervals  Vernell Reges, PT, DPT, OCS  Dayani Winbush E Traven Davids, PT 05/31/2024, 9:03 AM

## 2024-06-05 ENCOUNTER — Ambulatory Visit

## 2024-06-07 ENCOUNTER — Emergency Department: Admission: EM | Admit: 2024-06-07 | Discharge: 2024-06-07 | Disposition: A | Discharge status: Home / Self Care

## 2024-06-07 ENCOUNTER — Ambulatory Visit: Attending: Internal Medicine

## 2024-06-07 ENCOUNTER — Emergency Department

## 2024-06-07 ENCOUNTER — Encounter: Payer: Self-pay | Admitting: *Deleted

## 2024-06-07 ENCOUNTER — Other Ambulatory Visit: Payer: Self-pay

## 2024-06-07 DIAGNOSIS — M5416 Radiculopathy, lumbar region: Secondary | ICD-10-CM | POA: Insufficient documentation

## 2024-06-07 DIAGNOSIS — R296 Repeated falls: Secondary | ICD-10-CM | POA: Insufficient documentation

## 2024-06-07 DIAGNOSIS — R262 Difficulty in walking, not elsewhere classified: Secondary | ICD-10-CM | POA: Insufficient documentation

## 2024-06-07 DIAGNOSIS — R519 Headache, unspecified: Secondary | ICD-10-CM | POA: Diagnosis present

## 2024-06-07 DIAGNOSIS — G2401 Drug induced subacute dyskinesia: Secondary | ICD-10-CM | POA: Insufficient documentation

## 2024-06-07 DIAGNOSIS — M6281 Muscle weakness (generalized): Secondary | ICD-10-CM | POA: Insufficient documentation

## 2024-06-07 DIAGNOSIS — R5381 Other malaise: Secondary | ICD-10-CM | POA: Insufficient documentation

## 2024-06-07 LAB — BASIC METABOLIC PANEL WITH GFR
Anion gap: 13 (ref 5–15)
BUN: 7 mg/dL — ABNORMAL LOW (ref 8–23)
CO2: 23 mmol/L (ref 22–32)
Calcium: 9.8 mg/dL (ref 8.9–10.3)
Chloride: 104 mmol/L (ref 98–111)
Creatinine, Ser: 0.62 mg/dL (ref 0.44–1.00)
GFR, Estimated: >60 mL/min (ref >60)
Glucose, Bld: 129 mg/dL — ABNORMAL HIGH (ref 70–99)
Potassium: 3.5 mmol/L (ref 3.5–5.1)
Sodium: 140 mmol/L (ref 135–145)

## 2024-06-07 LAB — CBC
HCT: 45.2 % (ref 36.0–46.0)
Hemoglobin: 14.8 g/dL (ref 12.0–15.0)
MCH: 29 pg (ref 26.0–34.0)
MCHC: 32.7 g/dL (ref 30.0–36.0)
MCV: 88.5 fL (ref 80.0–100.0)
Platelets: 262 K/uL (ref 150–400)
RBC: 5.11 MIL/uL (ref 3.87–5.11)
RDW: 12.8 % (ref 11.5–15.5)
WBC: 6.8 K/uL (ref 4.0–10.5)
nRBC: 0 % (ref 0.0–0.2)

## 2024-06-07 LAB — MAGNESIUM: Magnesium: 2.2 mg/dL (ref 1.7–2.4)

## 2024-06-07 MED ORDER — IOHEXOL 350 MG/ML SOLN
75.0000 mL | Freq: Once | INTRAVENOUS | Status: AC | PRN
  Administered 2024-06-07: 75 mL via INTRAVENOUS

## 2024-06-07 MED ORDER — LORAZEPAM 2 MG/ML IJ SOLN
1.0000 mg | Freq: Once | INTRAMUSCULAR | Status: AC
  Administered 2024-06-07: 1 mg via INTRAVENOUS
  Filled 2024-06-07: qty 1

## 2024-06-07 MED ORDER — DIPHENHYDRAMINE HCL 50 MG/ML IJ SOLN
25.0000 mg | Freq: Once | INTRAMUSCULAR | Status: AC
  Administered 2024-06-07: 25 mg via INTRAVENOUS
  Filled 2024-06-07: qty 1

## 2024-06-07 MED ORDER — LORAZEPAM 2 MG/ML IJ SOLN
0.5000 mg | Freq: Once | INTRAMUSCULAR | Status: AC
  Administered 2024-06-07: 0.5 mg via INTRAVENOUS
  Filled 2024-06-07: qty 1

## 2024-06-07 MED ORDER — METOCLOPRAMIDE HCL 5 MG/ML IJ SOLN
10.0000 mg | Freq: Once | INTRAMUSCULAR | Status: AC
  Administered 2024-06-07: 10 mg via INTRAVENOUS
  Filled 2024-06-07: qty 2

## 2024-06-07 MED ORDER — DIPHENHYDRAMINE HCL 50 MG/ML IJ SOLN
INTRAMUSCULAR | Status: AC
  Administered 2024-06-07: 25 mg via INTRAVENOUS
  Filled 2024-06-07: qty 1

## 2024-06-07 MED ORDER — SODIUM CHLORIDE 0.9 % IV BOLUS
1000.0000 mL | Freq: Once | INTRAVENOUS | Status: AC
  Administered 2024-06-07: 1000 mL via INTRAVENOUS

## 2024-06-07 MED ORDER — DIPHENHYDRAMINE HCL 50 MG/ML IJ SOLN: 25.0000 mg | Freq: Once | INTRAMUSCULAR | Status: AC

## 2024-06-07 NOTE — Discharge Instructions (Signed)
 You were seen today due to concern of a headache, fortunately those symptoms have improved, unfortunately you experience an adverse reaction to the medication that we provided you with.  Your symptoms should improve over the next few hours, please be sure to drink plenty of water .  Should you have any worsening of symptoms or recurrence of your headache please return to the emergency department immediately for further medical management.  Otherwise follow-up with your neurologist in the outpatient setting as you have planned.

## 2024-06-07 NOTE — ED Notes (Signed)
 Patient continues to have restless legs and unable to sit still.

## 2024-06-07 NOTE — ED Triage Notes (Signed)
 Pt brought in via ems from home with a headache for 1 week.  Pt took tylenol  and oxycodone  without relief.    Pt has nausea.  Pt alert.

## 2024-06-07 NOTE — ED Notes (Signed)
 Patient ambulated to restroom using walker with steady gait. Denies any dizziness. Reports headache has improved. Family at bedside to transport patient home.

## 2024-06-07 NOTE — ED Notes (Signed)
 Pt now states spasms feel worse, not just in her legs but now going in to her back.  Provider notified.

## 2024-06-07 NOTE — ED Provider Notes (Signed)
 [facility] Provider Note    Event Date/Time   First MD Initiated Contact with Patient 06/07/24 0129     (approximate)   History   Headache   HPI  Brandi Bell is a 63 y.o. female who presents with complaint of a headache.  She has a history of migraine headaches, she states the last headache that she had this bad was about 6 years ago.  Describes sudden onset severe frontal headache which started about 1 week ago, has been trying Tylenol  and oxycodone  at home for help without much relief.  Denies any associate fevers numbness tingling weakness visual changes or any other symptoms associated with it.  Primarily nausea poor appetite and some vomiting episodes.  She states that on Monday she had a fall, she does not know if she lost consciousness at that time or not apparently she has been having increased frequency of falls.  Denying any other complaints or symptoms now.     Physical Exam   Triage Vital Signs: ED Triage Vitals  Encounter Vitals Group     BP 06/07/24 0133 118/64     Girls Systolic BP Percentile --      Girls Diastolic BP Percentile --      Boys Systolic BP Percentile --      Boys Diastolic BP Percentile --      Pulse Rate 06/07/24 0133 81     Resp 06/07/24 0133 18     Temp 06/07/24 0133 98.2 F (36.8 C)     Temp Source 06/07/24 0133 Oral     SpO2 06/07/24 0133 100 %     Weight 06/07/24 0134 218 lb (98.9 kg)     Height 06/07/24 0134 5' 1 (1.549 m)     Head Circumference --      Peak Flow --      Pain Score 06/07/24 0134 8     Pain Loc --      Pain Education --      Exclude from Growth Chart --     Most recent vital signs: Vitals:   06/07/24 0133 06/07/24 0710  BP: 118/64 (!) 137/91  Pulse: 81 98  Resp: 18 20  Temp: 98.2 F (36.8 C) 98.5 F (36.9 C)  SpO2: 100% 99%     General: Awake, wet towel over the face closed eyes, appears uncomfortable CV:  Good peripheral perfusion.  Resp:  Normal effort.  Abd:  No  distention.  Soft nontender Neuro:  Cranial nerves II to XII are intact, appropriate strength sensation coordination in all extremities Other:     ED Results / Procedures / Treatments   Labs (all labs ordered are listed, but only abnormal results are displayed) Labs Reviewed  BASIC METABOLIC PANEL WITH GFR - Abnormal; Notable for the following components:      Result Value   Glucose, Bld 129 (*)    BUN 7 (*)    All other components within normal limits  CBC  MAGNESIUM      EKG     RADIOLOGY   PROCEDURES:  Critical Care performed: No  Procedures   MEDICATIONS ORDERED IN ED: Medications  metoCLOPramide (REGLAN) injection 10 mg (10 mg Intravenous Given 06/07/24 0219)  diphenhydrAMINE  (BENADRYL ) injection 25 mg (25 mg Intravenous Given 06/07/24 0218)  sodium chloride  0.9 % bolus 1,000 mL (0 mLs Intravenous Stopped 06/07/24 0330)  iohexol (OMNIPAQUE) 350 MG/ML injection 75 mL (75 mLs Intravenous Contrast Given 06/07/24 0430)  diphenhydrAMINE  (BENADRYL ) injection 25  mg (25 mg Intravenous Given 06/07/24 0257)  LORazepam (ATIVAN) injection 1 mg (1 mg Intravenous Given 06/07/24 0320)  LORazepam (ATIVAN) injection 1 mg (1 mg Intravenous Given 06/07/24 0344)  LORazepam (ATIVAN) injection 0.5 mg (0.5 mg Intravenous Given 06/07/24 0426)  LORazepam (ATIVAN) injection 0.5 mg (0.5 mg Intravenous Given 06/07/24 0452)  sodium chloride  0.9 % bolus 1,000 mL (1,000 mLs Intravenous New Bag/Given 06/07/24 0614)     IMPRESSION / MDM / ASSESSMENT AND PLAN / ED COURSE  I reviewed the triage vital signs and the nursing notes.                               Patient's presentation is most consistent with acute complicated illness / injury requiring diagnostic workup.  63 year old female who presents today with a frontal headache.  She appears very uncomfortable on exam, vitals here are reassuring.  She has a history of migraine headaches, and most likely I suspect this is likely related, however  given the sudden onset nature, and the poor response to general treatments, I am going to obtain CT angio, to further assess for underlying etiologies.  Will attempt symptomatic management.  She does not seem to have any neurodeficits here fortunately, so I feel unlikely acute CVA or dural venous sinus thrombosis or other acute intracranial pathology.  Seems clinically unlikely associated with meningitis.  Will follow-up response to treatment imaging and determine safe disposition accordingly.   Clinical Course as of 06/07/24 0723  Wed Jun 07, 2024  0254 Patient symptoms did briefly improve, but now the demonstrating symptoms of tardive dyskinesia likely secondary to side effect from the Reglan.  Will give a second dose of Benadryl  here and reassess. [SK]  [id number] Continues to have persistence of the symptoms will give a dose of Ativan here. [SK]  0356 After second dose of Ativan, the patient's tremors have improved, the headache is also improved.  We are awaiting results of CT imaging. [SK]  0421 Patient symptoms continue to appear much more improved, however she still is having some effects of the tardive dyskinesia, will give a third dose of IV Ativan in anticipation of the CT.  She does state that the headache is improved. [SK]  0444 Attempted CT imaging of the head but unsuccessful due to patient's continued symptoms of tardive dyskinesia.  Will give another dose of Ativan and continue to assess and monitor. [SK]  0507 At this point the patient headache has resolved however she continues to have the same symptoms, unable to lay still.  At this point feel reasonable for admission for continued symptomatic treatment and management in setting of refractory tardive dyskinesia. [SK]  0532 Spoke with Dr. Lindzen, recommends fluids and allowing patient to metabolize medication and continued monitoring. Reached out to medicine for admission. [SK]  9395 Spoke with Dr. Lawence, he requested we have psych evaluate  the patient for recs given the findings. Will request their evaluation for recommendations. [SK]  [id number] I reassessed the patient, her symptoms have significantly improved at this time, she is requesting discharge home, I believe reasonable given the significant improvement in her symptoms.  I discussed return precautions, she is agreeable with the plan will follow-up with her neurologist in the outpatient setting she already has an MRI scheduled. [SK]    Clinical Course User Index [SK] Fernand Rossie HERO, MD     FINAL CLINICAL IMPRESSION(S) / ED DIAGNOSES   Final diagnoses:  Bad  headache  Tardive dyskinesia     Rx / DC Orders   ED Discharge Orders     None        Note:  This document was prepared using Dragon voice recognition software and may include unintentional dictation errors.   Fernand Rossie HERO, MD 06/07/24 [phone]

## 2024-06-07 NOTE — ED Notes (Signed)
 Pt having muscle spasms after Reglan administration. Provider notified and at bedside.

## 2024-06-12 ENCOUNTER — Ambulatory Visit

## 2024-06-14 ENCOUNTER — Ambulatory Visit

## 2024-06-14 NOTE — Consults (Signed)
 Facility Information: [facility]   Facility ID: [id number]  Facility Medicare Provider Number: [phone]      Physical Medicine and Rehabilitation  Rehab Pre-Admit Screening  Date: 06/14/2024   Time: 8:50 AM     Patient Information:    Patient Name:  Brandi Bell Medical Record Number: [id number]   Address:  [address] [place] [address] Sex: Female   Date of Birth: 1961-05-13 Age: 63 y.o.   Room/Bed:  7343/7343-01    _____________________________________________________________________________    Attending Physician's Review and Admission Determination   (Instructions for Physician: Type the smartphrase rehab below and then Co-Sign)            _____________________________________________________________________________    Advanced Directives:  Does patient have an advance directive covering medical treatment?: Patient does not have advance directive covering medical treatment.            Does patient have an advance directive covering mental health treatment?: Patient does not have advance directive covering mental health treatment.       Reason patient does not have an advance directive covering mental health treatment:: HCDM documented in the HCDM/Contact Info section.      Coverage Information:  Authorization number:    Authorization Code:  Activation code not generated  Current [facility] [place] Chart Status: Active  Payor: BCBS MEDICARE ADV / Plan: BLUE MEDICARE ADV HMO/PPO / Product Type: *No Product type* /     Physician/Referral Information:  Referring Facility: [facility]       Brandi DELENA Cornet, MD  Referring Case Manager: Carolee Benedetta RAMAN, RN           Prior Living Situation:  Living Environment: Trailer/Mobile home    Lives With: Mother    Home Living: One level home; Walk-in shower; Grab bars in shower; Shower chair with back; Ramped entrance; Handicapped height toilet                 Prior Level of Function:  Prior Function Comments: At baseline, pt reported being fully independent prior to January but at that point became sick and is still recovering. Pt endorsed at least 10 falls since then. Pt has recently been using a RW for mobility since the breaks on her rollator are broken. Pt has been working on her strength with outpatient PT but is still significantly limited in her balance. Pt endorsed multiple recent social stresses including separating from her partner and preparing to lose her house. Pt reported that her current d/c plan is to go to her mother's (described below) but would like to find her own place as soon as she can.                           Rehabilitation Diagnosis:  Etiologic Diagnosis / Description: Brandi Bell is a 63 y.o. female with a past medical history of MDD, NASH, fibromyalgia, prior suicide attempt, and asthma  currently hospitalized on 06/08/2024 for toxic metabolic encephalopathy deemed secondary to polypharmacy and poor PO intake.     [name] has participated in acute inpatient physical and occupational therapies to improve functional mobility, activity tolerance, functional strength, balance, and endurance in order to facilitate safe performance of ADLs and daily routines.       [name] has been referred to [facility] for continued acute medical management, provision of intensive inpatient therapies, and patient/family training to facilitate safe performance of ADLs and mobility  to discharge home.      Impairment Group: (Brain Dysfunction) 02.1 Non-Traumatic      Date of Onset: 06/08/24              Patient Active Problem List    Diagnosis Date Noted    Delirium due to multiple etiologies 06/09/2024    Severe episode of recurrent major depressive disorder    (CMS-HCC) 06/08/2024    Cellulitis of right lower extremity 03/30/2024    Acute hypoxemic respiratory failure    (CMS-HCC) 03/03/2024    Enrolled in chronic care management 03/02/2024    Other spondylosis, lumbar region 02/28/2024    Dyspnea on exertion 02/07/2024    Epigastric pain 01/07/2024    Chronic pain 01/07/2024    NASH (nonalcoholic steatohepatitis) 11/12/2023    Liver fibrosis 11/12/2023    Acute on chronic back pain 10/18/2023    Lumbosacral radiculopathy at L3 10/18/2023    NAFLD (nonalcoholic fatty liver disease) 88/80/7975    Family history of cirrhosis of liver 05/25/2023    Coronary artery disease without angina pectoris no PCIs 09/18/2022    Thyroid nodule 10/22/2020    Hot flashes 05/07/2020    Sensorineural hearing loss (SNHL) of both ears 02/19/2020    Hypersomnolence 02/14/2020    Iron deficiency 02/14/2020    Left lower quadrant abdominal pain 02/09/2020    Sciatica, right side 11/07/2019    Cervical pain (neck) 08/25/2019    Pain in both lower extremities 08/25/2019    Osteopenia 01/03/2018    Neuropathy 01/03/2018    DDD (degenerative disc disease), cervical 12/31/2017    Hip pain, bilateral 12/31/2017    Inflammation of sacroiliac joint 12/31/2017    Morbid (severe) obesity due to excess calories (CMS-HCC)     Obstructive sleep apnea syndrome 02/08/2017    Mild intermittent asthma without complication (HHS-HCC) 11/25/2016    Vitamin D deficiency 07/21/2016    Hx of suicide attempt 07/19/2016    Migraine 07/19/2016    PTSD (post-traumatic stress disorder) 07/19/2016    Osteoporosis 07/01/2016    RLS (restless legs syndrome) 07/01/2016    Trochanteric bursitis 03/17/2016    Prediabetes 03/17/2016    Pituitary adenoma    (CMS-HCC) 10/02/2015    Borderline personality disorder    (CMS-HCC) 02/10/2015    Obesity 10/30/2014    At risk for falls 09/05/2014    Anxiety 07/12/2014    Essential (primary) hypertension 04/04/2014    Bilateral chronic knee pain 04/02/2014    Fibromyalgia 12/21/2013    Seasonal allergies 12/21/2013    Bipolar disorder (CMS-HCC) 06/20/2013    GERD (gastroesophageal reflux disease) (RAF-HCC) 06/20/2013    Hyperlipidemia (RAF-HCC) 07/20/2012    History of malignant neoplasm of cervix 07/20/2012    Lumbar herniated disc 07/20/2012       Medical / Functional Conditions Requiring Inpatient Rehab: Patient requires monitoring of labwork, electrolytes, blood pressure and monitoring/management of medical diagnosis. Medical management/administration of antiemetics, opioid analgesics and other prescribed/necessary medications.  Monitoring/management of pain, cardiopulmonary status, renal status, gastrointestinal status, neurological status, infection, bleeding, bowel and bladder, DVT, and skin breakdown.      Risk for Medical / Clinical Complication: Patient at increased risk for complications of cardiopulmonary insufficiency, renal insufficiency, hypertensive crisis, gastrointestinal insufficiency, neurological decline, seizures, fluid volume overload, aspiration, bleeding, infection, uncontrolled pain, bowel/bladder retention, falls, and skin breakdown.      Special Rehabilitation Needs:  IV Lines / Tubes / Drains: PIV          Hearing -  Right Ear: Difficulty with noise    Hearing - Left Ear: Difficulty with noise       Cultural Requests During Hospitalization: Pt at increased risk of anxiety/depression related to recent medical status change       Nutrition Type: General             Requires modified schedule: Pt may require rest breaks between therapy sessions      Precautions: Falls precautions       Required Braces or Orthoses: Non-applicable               Past Medical History:  Past Medical History[1]  Past Surgical History:  Past Surgical History[2]  Family History:  Family History[3]  Social History:  Social History[4]  Current Medications and Prescriptions Ordered in Epic[5]  Prescriptions Prior to Admission[6]       Vitals:    06/13/24 0752 06/13/24 1622 06/13/24 2135 06/14/24 0601   BP: 125/88 123/76 159/96 128/68   Pulse: 66 80 79 67   Resp: 18 18 17 18    Temp: 36.2 ??C (97.2 ??F) 36.4 ??C (97.5 ??F) 36.5 ??C (97.7 ??F) 36.9 ??C (98.4 ??F)   TempSrc: Oral Oral Oral Oral   SpO2: 100% 100% 100% 96%   Weight:       Height:         Was the influenza vaccine received in this facility during this year's vaccination season?  If yes date: 03/2024   If no comment:    Vitals:    Height:  154.9 cm (5' 1), Weight: 98 kg (216 lb)    Labs:      CBC -   Lab Results   Component Value Date    WBC 8.2 06/08/2024    RBC 5.35 (H) 06/08/2024    HGB 15.9 (H) 06/08/2024    HCT 46.2 (H) 06/08/2024    MCV 86.3 06/08/2024    MCH 29.7 06/08/2024    MCHC 34.4 06/08/2024    MPV Not Detected 06/08/2024    PLT 277 06/08/2024     BMP -   Lab Results   Component Value Date    NA 142 06/10/2024    K 3.8 06/10/2024    CL 105 06/10/2024    CO2 26.0 06/10/2024    BUN 11 06/10/2024    CREATININE 0.81 06/10/2024    GFR >= 60 08/26/2012    GFRAAF >90 10/19/2020    GFRNAAF >90 10/19/2020    GLU 101 (H) 06/10/2024     CARD -   Lab Results   Component Value Date    CKTOTAL 50.0 03/19/2020    TROPONINI <3 06/05/2024     Coagulation -   Lab Results   Component Value Date    PT 10.8 06/05/2024    INR 0.95 06/05/2024    APTT 30.0 01/07/2024     ABGs-   Lab Results   Component Value Date    PHART 7.47 (H) 08/24/2012    PO2ART 174 (H) 08/24/2012    PCO2ART 23 (L) 08/24/2012    BEART -6.4 (L) 08/24/2012    HCO3ART 16.6 (L) 08/24/2012    O2SATART 99.8 08/24/2012     LFT's -   Lab Results   Component Value Date    ALBUMIN 4.0 06/08/2024    ALT 17 06/08/2024    AST 24 06/08/2024    ALKPHOS 108 06/08/2024    BILITOT 1.2 06/08/2024    PROT 7.7 06/08/2024  Current Functional Status:        Feeding : Set Up Assist    Grooming: Stand by Assist; Performed standing    Bathing: Min assist    Toileting: Stand by Assist    UB Dressing: Set Up Assist    LB Dressing: Min assist    IADLs: pt reported difficulty to tolerate static standing for long enought to complete cooking/cleaning tasks. The brakes on her rollator are broken and she hasn't been able to utilize it as often for safety.      Mobility:   Bed Mobility comments: elevated HOB, icnreased time STB A    Transfers: ST B A    Ambulation comments: very unsteady gait with cues for safe RW use, no overt LOB but benefits from occasional standing rest breaks to steady herself prior to returning to ambulationLevel of Assistance: Contact guard assist, steadying assistAssistive Device: Rolling walkerDistance Ambulated (ft): 100 ftAmbulation    Stairs: NT    Wheelchair Mobility: NT      Cognition/Swallow/Speech:  Patient's Vision Adequate to Safely Complete Daily Activities: Yes    Patient's Judgement Adequate to Safely Complete Daily Activities: No    Patient's Memory Adequate to Safely Complete Daily Activities: No    Patient Able to Express Needs/Desires: Yes    Patient has speech problem: No      DME Recommendations:  PT DME Recommendations: None    OT DME Recommendations: Defer to post acute      Willingness to Participate: Patient willing and able to particiapte in 3 hours of therapy         Rehab Goals and Plan    Expected level of improvement for safe discharge: Patient will discharge home as independent as possible with ADLs and functional mobility with least restrictive assistive device for household distances.     Patient / Family Goals: Patient will safely return home with family, modified independence in ADLs and assist as needed with ambulation for household distances.    Required treatments and services: Rehab nursing, Case management, Dietician/nutrition     Anticipated Interventions:    Physical Therapy: 60-120 min/day 5-7 days/wk  Occupational Therapy: 60-120 min/day 5-7 days/wk  Recreational therapy: 30 min/day 3 days/wk  Prosthetics and Orthotics: As Needed      Anticipated services upon discharge:     Outpatient therapy: PT, OT    Expected discharge destination: Home with family     Discharge support: Patient has a caregiver available    Patient/family/caregiver orientation: Patient and family agreeable to inpatient rehab plan    Estimated Length of Stay: 12 days    Projected Admission Date: Thursday June 15 2024    Reviewer's Signature, Date and Time: ALM ELEANORE KLEIN [facility]  [facility]  Inpatient Coordinator  [phone]    8:50 AM 06/14/2024         [1]   Past Medical History:  Diagnosis Date    Abnormal ECG     Acute injury of anterior cruciate ligament     Allergic rhinitis     Angina pectoris     05/2022    Anxiety     Arteriosclerosis     Arthritis     All over    Asthma (HHS-HCC)     They say i dont have it? But intabated twice for asthma    At risk for falls     Bell palsy 1981    Resolved    Bipolar 1 disorder    (CMS-HCC)  Brain concussion     Breast cyst 2012    Breast mass 07/20/2012    Overview:  Right breast mass found 06/2012 and had removed 1/14    Bursitis     C. difficile diarrhea     Cardiovascular disease     05/2022 high cholesterol and small artery blockages    Cataract 2007    removed/lens in-plants both eyes    Central sleep apnea     Cervical cancer    (CMS-HCC)     Cervical disc disorder     Cholelithiasis     Chronic nausea 06/20/2013    Closed fracture of lateral malleolus 12/31/2017    Colon polyp     Constipation     Coronary artery disease     L.A.D.    CTS (carpal tunnel syndrome)     Dental disease     Ive lost several teeth    Depressive disorder (RAF-HCC) 06/25/2000    DEPRESSION     Dry eyes     Dysplastic nevi 2005    Dysrhythmia     Essential hypertension 04/04/2014    Family history of breast cancer 01/23/2013    Family history of MI (myocardial infarction) 10/15/2013    Family history of thrombosis or embolism 05/24/2012    Overview:  Hypercoag work up in 2013 negative    Family hx-breast malignancy     Fatty liver     Fatty liver 05/25/2023    Fibromyalgia, primary     Financial difficulties     Fractures     L ankle x 3, R wrist, lumbar spine fracture    GERD (gastroesophageal reflux disease)     Glaucoma     Globus sensation 12/21/2013    Headache     Headache, tension-type     Hearing impairment     Heart murmur     Had since birth    HSV-2 infection     Hyperlipidemia 07/20/2012    Taking Crestor  Impaired mobility     Infectious viral hepatitis     Injury of posterior cruciate ligament     Irritable bowel syndrome     Joint pain     Kidney stones     history of kidney stones    Lactose intolerance     Liver fibrosis 11/12/2023    Migraine     Neuromuscular disorder (CMS-HCC)     Firbromialgia    Obesity     Osteoarthritis     Osteopenia     Osteoporosis 2013    osteopinia    Pneumonia     Polyarthritis     PONV (postoperative nausea and vomiting)     with GA    Psoriasis 10/242016    PTSD (post-traumatic stress disorder)     Restless leg syndrome 2002    Taking tramadol , and requip     S/P TAH-BSO 03/17/2016    Shoulder injury     Sleep apnea, obstructive 2017    Using Cpap also have central apnea    Spinal stenosis     Squamous cell carcinoma 04/2014    right leg     STD (sexually transmitted disease)     Herpes    Thoracic disc disorder     Thyroid nodule 10/22/2020    Tinnitus     Visual impairment    [2]   Past Surgical History:  Procedure Laterality Date    ABDOMINAL SURGERY      BLADDER REPAIR  BREAST EXCISIONAL BIOPSY Right 2013    excision of lipoma    BREAST LUMPECTOMY  2014    CATARACT EXTRACTION Bilateral 06/2006    Had surgery on both eyes    CHEMOTHERAPY      bromacryptomine for pituitary tumor in1998    CHOLECYSTECTOMY      Gallstones    COLONOSCOPY  2013    ELBOW SURGERY      Tendinitis ultrasound    ESOPHAGOGASTRODUODENOSCOPY      EYE SURGERY      cataract surgery    FOOT SURGERY      Hammer toe, bunion    HYSTERECTOMY  1998    Cervical cancer    NOSE SURGERY      OOPHORECTOMY Bilateral about 2001    ORTHOPEDIC SURGERY      Arthroscopy both shoulders    OTHER SURGICAL HISTORY      Gallbladder hysterectomy    PR CATH PLACE/CORON ANGIO, IMG SUPER/INTERP,W LEFT HEART VENTRICULOGRAPHY N/A 03/03/2022    Procedure: LEFT HEART CATHETERIZATION WITH POSSIBLE REVASCULARIZATION;  Surgeon: Fairy Ozell Dunn, MD;  Location: [facility];  Service: Cardiology    PR COLONOSCOPY FLX DX W/COLLJ [facility] WHEN PFRMD N/A 11/16/2022    Procedure: COLONOSCOPY;  Surgeon: Andrey Vinie Ade, MD;  Location: ENDO PROCEDURES [place];  Service: Gastroenterology    PR COLONOSCOPY W/BIOPSY SINGLE/MULTIPLE N/A 01/25/2019    Procedure: COLONOSCOPY, FLEXIBLE, PROXIMAL TO SPLENIC FLEXURE; WITH BIOPSY, SINGLE OR MULTIPLE;  Surgeon: Rollie Large, MD;  [facility];  Service: Gastroenterology    PR ENDOSCOPIC US  EXAM, ESOPH N/A 01/27/2024    Procedure: UGI ENDOSCOPY; WITH ENDOSCOPIC ULTRASOUND EXAMINATION LIMITED TO THE ESOPHAGUS;  Surgeon: Minnie Krystal Claude, MD;  [facility];  Service: Gastroenterology    PR UPPER GI ENDOSCOPY,BIOPSY N/A 01/02/2019    Procedure: UGI ENDOSCOPY; WITH BIOPSY, SINGLE OR MULTIPLE;  Surgeon: Rollie Large, MD;  [facility];  Service: Gastroenterology    PR UPPER GI ENDOSCOPY,BIOPSY N/A 11/16/2022    Procedure: ESOPHAGOGASTRODUODENOSCOPY WITH COLD FORCEPS BIOPSIES AND POLYPECTOMY;  Surgeon: Andrey Vinie Ade, MD;  Location: ENDO PROCEDURES ;  Service: Gastroenterology    PR UPPER GI ENDOSCOPY,BIOPSY N/A 01/11/2024    Procedure: UGI ENDOSCOPY; WITH BIOPSY, SINGLE OR MULTIPLE;  Surgeon: Mariann Norleen Mt, MD;  Location: [facility];  Service: Gastroenterology    ROOT CANAL      Several    SEPTOPLASTY      SHOULDER ARTHROSCOPY  04/09    Both shoulders    SHOULDER SURGERY      SINUS SURGERY      SKIN BIOPSY      TONSILLECTOMY AND ADENOIDECTOMY      TRIGGER POINT INJECTION      TUBAL LIGATION      After third child 1987    UPPER GASTROINTESTINAL ENDOSCOPY  2013    URETERAL STENT PLACEMENT      WISDOM TOOTH EXTRACTION      When i was 21   [3]   Family History  Problem Relation Age of Onset    Heart attack Mother     Breast cancer Mother 43        also had melanoma of her right eye    Asthma Mother     Cancer Mother         Colon, breast, melanoma, squamous cell    Diabetes Mother         Type 2 Heart disease Mother  A fib    Hypertension Mother     Hearing loss Mother     Angina Mother     Anesthesia problems Mother     Broken bones Mother     Osteoporosis Mother     Liver disease Mother         Fatty liver    Colorectal Cancer Mother         51    Arthritis Mother     Depression Mother     GER disease Mother     Aneurysm Mother         Aortic    Melanoma Mother         Lost right eye to it, started with a mole    Squamous cell carcinoma Mother     Blindness Mother     Cataracts Mother     Atrial fibrillation Mother     Neuropathy Mother     Allergic rhinitis Mother     Spinal Compression Fracture Mother     Hyperlipidemia Mother         Fatty liver (non drinker) led to scrosis    Stroke Mother     Heart attack Father     Early death Father         Massive MI age 35    Heart disease Father     Hypertension Father     Angina Father     Arthritis Sister     Heart disease Sister     Hypertension Sister         @45     Angina Sister     Thyroid disease Sister         Hypo    Asthma Sister     Breast cancer Sister 65    Osteoporosis Sister     Fibromyalgia Sister     Cancer Sister         Breast cancer , colon cancer    Miscarriages / Stillbirths Sister     Hearing loss Sister     GER disease Sister     Hypothyroidism Sister     Migraines Sister     Kidney disease Sister         Stones    Depression Sister     Hypertension Sister         @55     Rheumatologic disease Sister     Breast cancer Sister 77    Miscarriages / Stillbirths Sister     Hearing loss Sister     Basal cell carcinoma Sister     Squamous cell carcinoma Sister     Migraines Sister     Cancer Sister         Breast    Arthritis Sister     GER disease Sister     Breast cancer Sister 40    Brain cancer Brother     Cancer Brother         Glioblastoma    Retinal detachment Brother     Hypertension Brother     No Known Problems Daughter     Asthma Daughter     Depression Daughter         Deppression    Kidney disease Daughter         Stones Miscarriages / Stillbirths Daughter     Depression Daughter         Deppression    Kidney disease Daughter  Stones    Miscarriages / Stillbirths Daughter     GER disease Daughter     Thyroid disease Daughter     Depression Son         Bipolar    Arthritis Maternal Grandmother     Cancer Maternal Grandmother         Cervical    Diabetes Maternal Grandmother         Type 2    Vision loss Maternal Grandmother         Macular degeneration    Asthma Maternal Grandmother     Hearing loss Maternal Grandmother     Cataracts Maternal Grandmother     Macular degeneration Maternal Grandmother     Migraines Maternal Grandmother     Neuropathy Maternal Grandmother     Kidney disease Maternal Grandmother         Stones    Glaucoma Maternal Grandmother         Also had macular degeneration    Diabetes Maternal Grandfather         Type 1    Early death Maternal Grandfather         MI    Stroke Paternal Grandmother         70/deceased    Ovarian cancer Paternal Grandmother     Endometrial cancer Paternal Grandmother         postmenopausal    Arthritis Paternal Grandmother     No Known Problems Paternal Grandfather     Diabetes Maternal Aunt         Type 2    Glaucoma Maternal Aunt     Breast cancer Maternal Aunt 53    Colon cancer Maternal Aunt         unsure of age at diag    Colorectal Cancer Maternal Aunt 60        cancer in polyp    Arthritis Maternal Aunt     Asthma Maternal Aunt     COPD Maternal Aunt     Hearing loss Maternal Aunt     Cataracts Maternal Aunt     Cancer Maternal Aunt     Hypertension Maternal Aunt     Angina Maternal Aunt     GER disease Maternal Aunt     Depression Maternal Aunt     Breast cancer Maternal Aunt         unknown age    Arthritis Maternal Aunt     Cancer Maternal Aunt         Colon    COPD Maternal Aunt     Mental illness Maternal Aunt         Bipoloar    Hearing loss Maternal Aunt     Asthma Maternal Aunt     Cancer Maternal Aunt         Bone    Ulcers Maternal Aunt         Bleeding ulcer Arthritis Maternal Aunt     COPD Maternal Aunt     Hearing loss Maternal Aunt     Cancer Maternal Uncle         Lung    Cancer Maternal Uncle         Lung    COPD Maternal Uncle     Cancer Maternal Uncle         Mouth cancer and lung    COPD Maternal Uncle     Mental illness Paternal Aunt     Colon cancer  Paternal Aunt         diag in her 46's    GER disease Paternal Aunt     Aneurysm Paternal Aunt         Aortic    Arthritis Paternal Aunt     Early death Paternal Aunt         Annurisium    Heart disease Paternal Aunt         Aortic Anneurisium    Cancer Paternal Aunt         Stomach, colon, pancreatic    Retinal detachment Paternal Aunt     Mental illness Paternal Aunt     GER disease Paternal Aunt     Arthritis Paternal Aunt     Early death Paternal Aunt     Heart disease Paternal Aunt     Aneurysm Paternal Aunt     Early death Paternal Aunt         MI    Early death Paternal Uncle         MI    Early death Paternal Uncle         MI    Breast cancer Cousin 47        maternal-just diagnosed    No Known Problems Other     Breast cancer Niece     Clotting disorder Neg Hx     Collagen disease Neg Hx     Dislocations Neg Hx     Gout Neg Hx     Hemophilia Neg Hx     Scoliosis Neg Hx     Severe sprains Neg Hx     Sickle cell anemia Neg Hx     BRCA 1/2 Neg Hx     Amblyopia Neg Hx     Strabismus Neg Hx    [4]   Social History  Socioeconomic History    Marital status: Single     Spouse name: None    Number of children: None    Years of education: None    Highest education level: None   Occupational History    Occupation: retired   Tobacco Use    Smoking status: Never     Passive exposure: Never    Smokeless tobacco: Never   Vaping Use    Vaping status: Never Used   Substance and Sexual Activity    Alcohol use: Never     Alcohol/week: 1.0 standard drink of alcohol     Types: 1 Standard drinks or equivalent per week     Comment: socially    Drug use: Never    Sexual activity: Not Currently     Partners: Female   Other Topics Concern    Do you use sunscreen? No    Tanning bed use? No    Are you easily burned? No    Excessive sun exposure? No    Blistering sunburns? No    Exercise Yes     Comment: PT exercises    Living Situation No   Social History Narrative    Lives alone    Employment: retired        Caffeine: none    Exercise: regularly    Seatbelts: regularly    Cell phone while driving: never    Sunscreen: never    Dental care: regularly    Eye care: regularly     Social Drivers of Health     Food Insecurity: Food Insecurity Present (05/24/2024)    Hunger Vital Sign  Worried About Programme Researcher, Broadcasting/film/video in the Last Year: Often true     Ran Out of Food in the Last Year: Often true   Tobacco Use: Low Risk (06/13/2024)    Patient History     Smoking Tobacco Use: Never     Smokeless Tobacco Use: Never     Passive Exposure: Never   Transportation Needs: No Transportation Needs (06/09/2024)    PRAPARE - Transportation     Lack of Transportation (Medical): No     Lack of Transportation (Non-Medical): No   Alcohol Use: Not At Risk (08/31/2023)    Received from [facility]    AUDIT-C     Q1: How often do you have a drink containing alcohol?: Never     Q2: How many drinks containing alcohol do you have on a typical day when you are drinking?: Patient does not drink     Q3: How often do you have six or more drinks on one occasion?: Never   Housing: High Risk (06/09/2024)    Housing     Within the past 12 months, have you ever stayed: outside, in a car, in a tent, in an overnight shelter, or temporarily in someone else's home (i.e. couch-surfing)?: No     Are you worried about losing your housing?: Yes   Utilities: High Risk (06/09/2024)    Utilities     Within the past 12 months, have you been unable to get utilities (heat, electricity) when it was really needed?: Yes   Stress: Stress Concern Present (03/07/2024)    [employer] - Occupational Stress Questionnaire     Feeling of Stress: Rather much Interpersonal Safety: Not At Risk (06/08/2024)    Interpersonal Safety     Unsafe Where You Currently Live: No     Physically Hurt by Anyone: No     Abused by Anyone: No   Substance Use: Low Risk (12/06/2023)    Substance Use     In the past year, how often have you used prescription drugs for non-medical reasons?: Never     In the past year, how often have you used illegal drugs?: Never     In the past year, have you used any substance for non-medical reasons?: No   Social Connections: Unknown (05/21/2023)    Received from [facility]    OASIS D0700: Social Isolation     Frequency of experiencing loneliness or isolation: Patient unable to respond   Financial Resource Strain: High Risk (06/09/2024)    Overall Financial Resource Strain (CARDIA)     Difficulty of Paying Living Expenses: Very hard   Health Literacy: Low Risk (12/23/2023)    Health Literacy     : Never   Internet Connectivity: No Internet connectivity concern identified (12/23/2023)    Internet Connectivity     Do you have access to internet services: Yes     How do you connect to the internet: Personal Device at home     Is your internet connection strong enough for you to watch video on your device without major problems?: Yes     Do you have enough data to get through the month?: Yes     Does at least one of the devices have a camera that you can use for video chat?: Yes   [5]   Current Facility-Administered Medications Ordered in Epic   Medication Dose Route Frequency Provider Last Rate Last Admin    acetaminophen  (TYLENOL )  tablet 1,000 mg  1,000 mg Oral Q8H PRN Janjua, Ejaz Arshad, DO   1,000 mg at 06/13/24 1156    albuterol  (PROVENTIL  HFA;VENTOLIN  HFA) 90 mcg/actuation inhaler 2 puff  2 puff Inhalation Q6H PRN Janjua, Ejaz Arshad, DO   2 puff at 06/13/24 0851    albuterol  2.5 mg /3 mL (0.083 %) nebulizer solution 2.5 mg  2.5 mg Nebulization Q6H PRN Janjua, Ejaz Arshad, DO        aspirin  chewable tablet 81 mg  81 mg Oral Daily Janjua, Ejaz Seltzer, DO   81 mg at 06/13/24 9177    buPROPion  (Wellbutrin  SR) 12 hr tablet 200 mg  200 mg Oral Daily Symmes, Anna Gravier, MD   200 mg at 06/13/24 9176    calcium  carbonate-cholecalciferol  600 mg of elem-5 mcg (200 unit) COMBO product   Oral Daily Janjua, Ejaz Arshad, DO   Given at 06/13/24 9176    cyanocobalamin  (vitamin B-12) tablet 1,000 mcg  1,000 mcg Oral Daily Janjua, Ejaz Arshad, DO   1,000 mcg at 06/13/24 9176    diclofenac  sodium (VOLTAREN ) 1 % gel 2 g  2 g Topical QID Symmes, Anna Gravier, MD   2 g at 06/13/24 1156    enoxaparin  (LOVENOX ) syringe 40 mg  40 mg Subcutaneous Q12H Janjua, Ejaz Arshad, DO   40 mg at 06/13/24 2001    esomeprazole  (NEXIUM ) granules 40 mg  40 mg Oral Daily before breakfast Symmes, Anna Gravier, MD   40 mg at 06/13/24 9177    famotidine  (PEPCID ) tablet 20 mg  20 mg Oral BID PRN Symmes, Anna Gravier, MD   20 mg at 06/12/24 0103    fluticasone  furoate-vilanterol (BREO ELLIPTA ) 200-25 mcg/dose inhaler 1 puff  1 puff Inhalation Daily (RT) Janjua, Ejaz Arshad, DO   1 puff at 06/13/24 9175    magnesium  oxide (MAG-OX) tablet 400 mg  400 mg Oral BID Janjua, Ejaz Arshad, DO   400 mg at 06/13/24 2000    melatonin tablet 3 mg  3 mg Oral Nightly PRN Janjua, Ejaz Arshad, DO   3 mg at 06/12/24 2047    multivitamins, therapeutic with minerals tablet 1 tablet  1 tablet Oral Daily Symmes, Anna Gravier, MD   1 tablet at 06/13/24 9176    ondansetron  (ZOFRAN -ODT) disintegrating tablet 4 mg  4 mg Oral TID AC Adam, Safiyya A, MD   4 mg at 06/13/24 1649    oxyCODONE  (ROXICODONE ) immediate release tablet 5 mg  5 mg Oral Q4H PRN Janjua, Ejaz Arshad, DO   5 mg at 06/13/24 2216    polyethylene glycol (MIRALAX ) packet 17 g  17 g Oral BID PRN Janjua, Ejaz Arshad, DO   17 g at 06/13/24 9175    pregabalin  (LYRICA ) capsule 100 mg  100 mg Oral BID Janjua, Ejaz Arshad, DO   100 mg at 06/13/24 2000    thiamine  mononitrate (vit B1) tablet 100 mg  100 mg Oral Daily Symmes, Anna Gravier, MD   100 mg at 06/13/24 9175     No current Epic-ordered outpatient medications on file.   [6]   Medications Prior to Admission   Medication Sig Dispense Refill Last Dose/Taking    acetaminophen  (TYLENOL  EXTRA STRENGTH) 500 MG tablet Take 2 tablets (1,000 mg total) by mouth every six (6) hours as needed for pain.       albuterol  2.5 mg /3 mL (0.083 %) nebulizer solution Inhale 3 mL (2.5 mg total) by nebulization every six (6) hours as needed for  wheezing.       albuterol  HFA 90 mcg/actuation inhaler Inhale 2 puffs every six (6) hours as needed. 17 g 11     aspirin  (ECOTRIN) 81 MG tablet Take 1 tablet (81 mg total) by mouth daily. 30 tablet 11     buPROPion  (WELLBUTRIN  SR) 200 MG 12 hr tablet Take 1 tablet (200 mg total) by mouth nightly.       cetirizine  (ZYRTEC ) 5 MG tablet Take 2 tablets (10 mg total) by mouth daily.       dupilumab  (DUPIXENT ) 300 mg/2 mL injection Inject 2 mL (300 mg total) under the skin every fourteen (14) days.       EPINEPHrine  (EPIPEN ) 0.3 mg/0.3 mL injection Inject 0.3 mL (0.3 mg total) into the muscle once as needed for anaphylaxis for up to 1 dose. 0.3 mL 0     esomeprazole  (NEXIUM ) 40 MG capsule Take 1 capsule (40 mg total) by mouth daily before breakfast. 90 capsule 3     fluticasone  propion-salmeterol (ADVAIR  HFA) 115-21 mcg/actuation inhaler Inhale 2 puffs two (2) times a day. 36 g 3     furosemide  (LASIX ) 20 MG tablet Take 1 tablet (20 mg total) by mouth daily. 100 tablet 3     naloxone  (NARCAN ) 4 mg nasal spray One spray in either nostril once for known/suspected opioid overdose. May repeat every 2-3 minutes in alternating nostril til EMS arrives 2 each PRN     nitroglycerin  (NITROSTAT ) 0.4 MG SL tablet Place 1 tablet (0.4 mg total) under the tongue every five (5) minutes as needed for chest pain. Maximum of 3 doses in 15 minutes.       oxyCODONE  (ROXICODONE ) 5 MG immediate release tablet Take 1 tablet (5 mg total) by mouth every four (4) hours as needed.       polyethylene glycol (MIRALAX ) 17 gram packet Take 17 g by mouth daily. 90 packet 3     pregabalin  (LYRICA ) 200 MG capsule Take 1 capsule (200 mg total) by mouth two (2) times a day.       rosuvastatin  (CRESTOR ) 10 MG tablet Take 1 tablet (10 mg total) by mouth daily. 90 tablet 3

## 2024-06-15 NOTE — Discharge Summary (Signed)
 Physician Discharge Summary [facility]  [address] [facility]  [address]  [place] [address]  Dept: [phone]  Loc: [phone]     Identifying Information:   Brandi Bell  January 08, 1961  [id number]    Primary Care Physician: Jesus Griffes, MD     Code Status: Code with Limitations    Admit Date: 06/08/2024    Discharge Date: 06/15/2024     Discharge To: Home with Home Health and/or PT/OT    Discharge Service: [facility]     Discharge Attending Physician: No att. providers found    Discharge Diagnoses:   Principal Problem:    Severe episode of recurrent major depressive disorder    (CMS-HCC) (POA: Yes)  Active Problems:    Fibromyalgia (POA: Yes)    RLS (restless legs syndrome) (POA: Yes)    Hx of suicide attempt (POA: Not Applicable)    Obstructive sleep apnea syndrome (POA: Yes)    PTSD (post-traumatic stress disorder) (POA: Yes)    Neuropathy (POA: Yes)    Coronary artery disease without angina pectoris no PCIs (POA: Yes)    NAFLD (nonalcoholic fatty liver disease) (POA: Yes)    Delirium due to multiple etiologies (POA: Unknown)  Resolved Problems:    * No resolved hospital problems. [facility] Course:     Outpatient Follow-Up  [ ]  Ensure psychiatry appt is made -- messaged primary psychiatrist Dr. Pamelia on day of discharge to arrange hospital follow-up  [ ]  Determine if/when to resume Lasix  (on hold)  [ ]  F/u balance difficulties    Brandi Bell is a 63 yo F with hx of severe recurrent major depressive disorder, prior suicide attempt, fibromyalgia, frequent falls, coronary artery disease, morbid obesity, obstructive sleep apnea, restless leg syndrome, neuropathy, PTSD, and NAFLD, who was admitted for acute encephalopathy in the setting of urinary retention and polypharmacy, with functional decline and frequent falls over the past year.    Problem-Oriented Hospitalization Summary    Delirium Due to Multiple Etiologies (Resolved)  She developed acute encephalopathy and altered mental status, attributed to urinary retention and polypharmacy, including anticholinergic medications and lorazepam administered in the ED. Her mental status improved rapidly after Foley catheter placement and medication adjustments, and delirium resolved prior to discharge.    Frequent Falls, Balance Difficulties, and Functional Decline  She experienced frequent falls and functional decline over the past year, with at least 10 falls since January and increasing difficulty with mobility and ADLs. PT and OT evaluations revealed high fall risk, impaired balance, decreased strength and endurance, and need for assistance with transfers and mobility. She was motivated for rehabilitation and referred to [facility] for intensive therapy, with post-discharge recommendations for high-intensity PT and OT 5x weekly. MRI brain was obtained to rule out stroke due to reported slurred speech and was negative for acute intracranial abnormalities. Unfortunately, patient reported that her insurance company called her and notified her that they denied [facility] insurance auth. She elected to discharge home despite counseling on risk of falling and other injuries from balance difficulties. Offered to work on peer to peer, however, patient felt home with close family supervision would be best. PCP notified and will help continue manage outpatient PT/OT.     Severe Episode of Recurrent Major Depressive Disorder, Complex PTSD, and Prior Suicide Attempt  She presented with worsening depression, passive death wish, and functional decline, but denied active suicidal ideation or intent throughout the admission. She reported longstanding trauma history and  anxiety, with recent exacerbation due to psychosocial stressors including loss of her partner, financial strain, and housing insecurity. She had discontinued all psychiatric medications two weeks prior to admission due to concerns about polypharmacy, but was restarted on bupropion  200 mg in the morning, which she tolerated well and reported improvement in mood, sleep, and appetite. Hydroxyzine  was discontinued due to dizziness and nausea. She was medically cleared for discharge to acute inpatient rehabilitation ([facility]IR), with voluntary psychiatric admission as a backup if AIR was not available. Psychiatry recommended ongoing outpatient follow-up and inclusion of suicide prevention resources in discharge instructions. Patient's primary psychiatrist was messaged on day of discharge to help arrange outpatient follow-up.     Poor Oral Intake, Nausea, and Severe GERD  She reported minimal solid oral intake for over a week prior to admission due to nausea and vomiting, with improvement after initiation of ondansetron  and dietary modifications. She tolerated liquids and bland foods, and nutritional status improved during hospitalization; she did not meet criteria for malnutrition at discharge. GERD symptoms were prominent, especially in the morning, and improved with esomeprazole  (Nexium ) and avoidance of appetite stimulants. Famotidine  was trialed PRN, and B12 supplementation was discontinued after normalization of levels. She was discharged with Zofran  TID PRN.     Pain Syndromes: Fibromyalgia, RLS, Neuropathy, and Chronic Pain  She had chronic pain from fibromyalgia, neuropathy, and restless leg syndrome, with persistent neck and wrist pain after a recent fall. MRI of the cervical spine and wrist x-ray were negative for acute pathology. Pregabalin  was continued for pain and RLS, and ropinirole  was discontinued due to lack of benefit and polypharmacy concerns. Oxycodone  was continued for pain. Tramadol  was temporarily trialed per patient request, however, patient preferenced Oxycodone  so Tramadol  PRN was discontinued and Oxycodone  re-started.     Coronary Artery Disease Without Angina: Aspirin  81 mg daily was continued.    OSA: Home CPAP continued.     Severe TH2 Asthma (Resolved Exacerbation)  She had a prior ICU admission for severe asthma earlier in the year, with significant improvement since starting Dupilumab  in September 2025. Singulair  was discontinued due to lack of benefit and side effects. She remained stable on Breo and PRN albuterol .    Chronic Venous Insufficiency and Lower Extremity Edema  She has chronic venous insufficiency with prior admissions for lower extremity edema, which improved after discontinuation of amlodipine . Compression stockings were not tolerated due to RLS. Lasix  was held during hospitalization due to poor oral intake and held at time of discharge.    Touchbase with Outpatient Provider:  Warm Handoff: Completed on 06/15/24 by Bruna DELENA Cornet, MD  (Attending) via Epic Secure Chat    Procedures:  None  ______________________________________________________________________  Discharge Medications:      Your Medication List        PAUSE taking these medications      furosemide  20 MG tablet  Wait to take this until your doctor or other care provider tells you to start again.  Commonly known as: LASIX   Take 1 tablet (20 mg total) by mouth daily.            START taking these medications      famotidine  20 MG tablet  Commonly known as: PEPCID   Take 1 tablet (20 mg total) by mouth two (2) times a day as needed.     multivitamins, therapeutic with minerals 9 mg iron-400 mcg tablet  Take 1 tablet by mouth daily.  Start taking on: June 16, 2024  ondansetron  4 MG disintegrating tablet  Commonly known as: ZOFRAN -ODT  Take 1 tablet (4 mg total) by mouth every eight (8) hours as needed for nausea for up to 14 days.     thiamine  mononitrate (vit B1) 100 mg Tab tablet  Take 1 tablet (100 mg total) by mouth daily.  Start taking on: June 16, 2024            CHANGE how you take these medications      pregabalin  100 MG capsule  Commonly known as: LYRICA   Take 1 capsule (100 mg total) by mouth two (2) times a day.  What changed:   medication strength  how much to take            CONTINUE taking these medications albuterol  2.5 mg /3 mL (0.083 %) nebulizer solution  Inhale 3 mL (2.5 mg total) by nebulization every six (6) hours as needed for wheezing.     albuterol  90 mcg/actuation inhaler  Commonly known as: PROVENTIL  HFA;VENTOLIN  HFA  Inhale 2 puffs every six (6) hours as needed.     aspirin  81 MG tablet  Commonly known as: ECOTRIN  Take 1 tablet (81 mg total) by mouth daily.     buPROPion  200 MG 12 hr tablet  Commonly known as: Wellbutrin  SR  Take 1 tablet (200 mg total) by mouth nightly.     cetirizine  5 MG tablet  Commonly known as: ZYRTEC   Take 2 tablets (10 mg total) by mouth daily.     dupilumab  syringe  Commonly known as: DUPIXENT   Inject 2 mL (300 mg total) under the skin every fourteen (14) days.     DUPIXENT  PEN 300 mg/2 mL pen injector  Generic drug: dupilumab   Inject the contents of 1 pen (300 mg total) under the skin every fourteen (14) days.     EPINEPHrine  0.3 mg/0.3 mL injection  Commonly known as: EPIPEN   Inject 0.3 mL (0.3 mg total) into the muscle once as needed for anaphylaxis for up to 1 dose.     esomeprazole  40 MG capsule  Commonly known as: NEXIUM   Take 1 capsule (40 mg total) by mouth daily before breakfast.     fluticasone  propion-salmeterol 115-21 mcg/actuation inhaler  Commonly known as: ADVAIR  HFA  Inhale 2 puffs two (2) times a day.     naloxone  4 mg/actuation nasal spray  Commonly known as: NARCAN   One spray in either nostril once for known/suspected opioid overdose. May repeat every 2-3 minutes in alternating nostril til EMS arrives     nitroglycerin  0.4 MG SL tablet  Commonly known as: NITROSTAT   Place 1 tablet (0.4 mg total) under the tongue every five (5) minutes as needed for chest pain. Maximum of 3 doses in 15 minutes.     oxyCODONE  5 MG immediate release tablet  Commonly known as: ROXICODONE   Take 1 tablet (5 mg total) by mouth every four (4) hours as needed.     polyethylene glycol 17 gram packet  Commonly known as: MIRALAX   Take 17 g by mouth daily.     rosuvastatin  10 MG tablet  Commonly known as: CRESTOR   Take 1 tablet (10 mg total) by mouth daily.     TYLENOL  EXTRA STRENGTH 500 MG tablet  Generic drug: acetaminophen   Take 2 tablets (1,000 mg total) by mouth every six (6) hours as needed for pain.              Allergies:  Ciprofloxacin, Perfume, Cymbalta  [duloxetine ], Imipramine, Oxybutynin, Quetiapine, Cat  dander, Cat's claw, Adhesive, Adhesive tape-silicones, Atorvastatin, Clindamycin, Diclofenac , Haldol  [haloperidol lactate], Haldol [haloperidol], Meloxicam , Morphine , Naproxen , Opioids - morphine  analogues, Peanut butter flavor, Penicillin, Pineapple, Pravastatin, Prochlorperazine, Sulfa (sulfonamide antibiotics), Sulfasalazine, Tapentadol, and Toradol  [ketorolac ]  ______________________________________________________________________  Pending Test Results:      Most Recent Labs:  All lab results last 24 hours - No results found for this or any previous visit (from the past 24 hours).    Relevant Studies/Radiology:  MRI Brain W Wo Contrast  Result Date: 06/14/2024  EXAM: Magnetic resonance imaging, brain, without and with contrast material. ACCESSION: [id number] UN CLINICAL INDICATION: 63 years old Female with frequent falls, balance issues  COMPARISON: CT  06/08/2024 and priors, MRI 08/05/2019 TECHNIQUE: Multiplanar, multisequence MR imaging of the brain was performed without and with I.V. contrast. FINDINGS:  There is no focal parenchymal signal abnormality. Ventricles are normal in size. There is no midline shift. No extra-axial fluid collection. No evidence of intracranial hemorrhage. No diffusion weighted signal abnormality to suggest acute infarct. Unchanged small right choroid plexus xanthogranuloma. No mass. There is no abnormal enhancement. Bilateral lens replacements. Mucosal thickening of the bilateral maxillary sinuses.     --No acute intracranial normality. No acute infarct.     XR Wrist 3 Or More Views Right  Result Date: 06/10/2024  EXAM: XR WRIST 3 OR MORE VIEWS RIGHT DATE: 06/10/2024 4:09 PM ACCESSION: [id number] UN DICTATED: 06/10/2024 4:56 PM INTERPRETATION LOCATION: [facility] CLINICAL INDICATION: 63 years old Female with fall > 1 week ago, now with wrist pain    COMPARISON: 01/10/2015 TECHNIQUE: PA, lateral, and oblique views of the right wrist. FINDINGS: No acute fracture. Mild positive ulnar variance. Joint alignment otherwise normal. Mild multifocal osteoarthrosis. Soft tissue swelling.     Soft tissue swelling. No evidence for acute fracture.    MRI Cervical Spine Wo Contrast  Result Date: 06/08/2024  EXAM: Magnetic resonance imaging, spinal canal and contents, cervical without contrast material. DATE: 06/08/2024 5:37 PM ACCESSION: [id number] UN DICTATED: 06/08/2024 5:45 PM INTERPRETATION LOCATION: [facility] CLINICAL INDICATION: 63 years old Female with concern for diskitis  COMPARISON: CT of the cervical spine 05/29/2024 TECHNIQUE: Multiplanar multisequence MRI was performed through the cervical spine without intravenous contrast. FINDINGS: Evaluation is degraded by motion artifact. Modic type I changes at the inferior plate of C5. Otherwise unremarkable marrow signal. Normal signal in the spinal cord. The vertebral bodies are normally aligned. Mild intervertebral disc space narrowing at the C5-C6 and C6-C7. Otherwise, disc spaces are preserved. Multilevel intervertebral disc base height loss with multilevel posterior bulging and uncovertebral and mild facet joint. No severe spinal canal stenosis however disc bulging effaces the ventral thecal sac at the level of C4-5, C5-6, and C6-7 resulting in mild spinal canal stenosis. No severe neural foraminal stenosis. The paraspinal tissues are within normal limits.     --No acute fracture or dislocation of the cervical spine. No cord signal abnormality. --Signal abnormality at the inferior endplate of C5 which is favored to reflect degenerative change. No findings to suggest discitis osteomyelitis however evaluation is degraded by motion artifact and lack of IV contrast. --Multilevel degenerative changes without severe spinal canal or severe neural foraminal stenosis.     ECG 12 Lead  Result Date: 06/08/2024  NORMAL SINUS RHYTHM NORMAL ECG WHEN COMPARED WITH ECG OF 05-Jun-2024 04:45, NO SIGNIFICANT CHANGE WAS FOUND Confirmed by Mazzella, Tony ([id number]) on 06/08/2024 3:57:58 PM    XR Chest 2 views  Result Date: 06/08/2024  EXAM: XR CHEST 2 VIEWS ACCESSION: [id number] UN REPORT  DATE: 06/08/2024 3:05 PM CLINICAL INDICATION: ALTERED MENTAL STATUS  TECHNIQUE: PA and Lateral Chest Radiographs COMPARISON: XR CHEST PORTABLE 06/05/2024 FINDINGS: Hazy opacities in both lungs. Left costophrenic sulcus is blunted. No large pleural effusion. Images are degraded by superimposed soft tissue. Stable cardiac silhouette.     Hazy opacities in both lungs may represent pulmonary edema     CT head WO contrast  Result Date: 06/08/2024  EXAM: Computed tomography, head or brain without contrast material. ACCESSION: [id number] UN CLINICAL INDICATION: 64 years old Female with Confusion  COMPARISON: CT HEAD WO CONTRAST 05/29/2024 TECHNIQUE: Axial CT images of the head from skull base to vertex without contrast. FINDINGS: There is no midline shift. No mass lesion. There is no evidence of acute infarct. Few scattered hypodensities in the periventricular and deep white matter, nonspecific but commonly associated with small vessel ischemic changes. There are calcifications involving the carotid siphons. Mild mucosal thickening in the inferior maxillary sinuses, otherwise the paranasal sinuses are pneumatized. The mastoid air cells are clear. Bilateral lens replacements. No intracranial hemorrhage or skull fractures.     No acute intracranial abnormality.     ______________________________________________________________________  Discharge Instructions:                Follow Up instructions and Outpatient Referrals     Discharge instructions          Appointments which have been scheduled for you      Jul 03, 2024 11:00 AM  (Arrive by 10:45 AM)  MAMMO DIAGNOSTIC LEFT TOMO with HBR MAMMO RM 2  [facility] Breast Imaging Department [facility]) [address]  [place] [address]  [phone]   Please Wear a two piece outfit, Do not wear deodorant, powder, oils or lotion on your chest and under arms.       Jul 03, 2024 1:30 PM  (Arrive by 1:15 PM)  US  BREAST LIMITED LEFT with HBR US  MAMMO RM  [facility] Breast Imaging Department [facility]) [address]  [place] [address]  [phone]   On appt date:  Bring Mammo and results from outside facility if applicable       Jul 10, 2024 11:00 AM  (Arrive by 10:45 AM)  RETURN VIDEO VISIT DIRECT LINK with Arland Earnie Collier, PhD  [facility] GI MEDICINE [place])  Arrive at: This is a Video Visit [address]  [facility] 1 through 4  [place] [address]  [phone]   A direct link will be sent to you by your provider at the time of your video appointment. Please do NOT go to the clinic.     For your video visit, you will need a computer with a working camera, speaker and microphone, a smartphone, or a tablet with internet access.         Jul 11, 2024 2:40 PM  (Arrive by 2:25 PM)  RETURN CONTINUITY with Garen Cone, MD  [facility] INTERNAL MEDICINE [place] [facility]) [address]  [facility] 1 through 4  [place] [address]  [phone]        Jul 23, 2024 9:00 AM  (Arrive by 8:45 AM)  MRI Brain With and Without Contrast with IC MRI MBL 1  IMG MRI IMAGING CENTER [facility]) [address]  1st Floor  [place] [address]  [phone]   On appt date:  Bring recent lab  work  Pharmacologist of any metal object implants  Take meds as usual  Check w/physician if diabetic  You will be asked to change into a gown for your safety    On appt date do not:  No restrictions on food/drink  Wear metallic items including jewelry (we are not responsible for lost items)    Let us  know if pt:  Claustrophobic  Metal object implant  Pregnant  Prescribed a sedative  On dialysis  Allergic to MRI dye/contrast  Kidney Failure    (Title:MRIWCNTRST)       Jul 26, 2024 8:00 AM  (Arrive by 7:45 AM)  RETURN PULMONARY with Sheppard JULIANNA Dandy, MD  [facility] PULMONARY SPECIALTY CL [place] [facility]) [address]  [facility] 1 through 4  [place] [address]  [phone]        Aug 01, 2024 8:45 AM  (Arrive by 8:30 AM)  RETURN SPINE with Franky Curtistine Ambrosia, DO  [facility] SPINE CTR NEUROSRG PMR [facility] [address] [facility] REGION) [address]  2nd Floor  [place] [address]  [phone]        Aug 07, 2024 9:45 AM  (Arrive by 9:30 AM)  INFUSION ONLY with UNCTIF 08  [facility] THERAPEUTIC INFUSION CTR [place] [facility]) [address]  [facility] 1 through 4  [place] [address]  [phone]        Nov 21, 2024 8:00 AM  (Arrive by 7:45 AM)  RETURN HEPATOLOGY with Manuelita ONEIDA Dickens, AGNP  [facility] GI MEDICINE [place] [facility]) [address]  [facility] 1 through 4  [place] [address]  [phone]        Feb 06, 2025 10:40 AM  RETURN OPTOMETRY with Ozell Debby Georgi, OD  [facility] [address] [facility]) [address]  [address]  [place] [address]  [phone]   Be advised the duration of your appointment with may range 1-2.5 hours.            ______________________________________________________________________  Discharge Day Services:  BP 139/67  - Pulse 84  - Temp 36.4 ??C (97.5 ??F) (Axillary)  - Resp 18  - Ht 154.9 cm (5' 1)  - Wt 98 kg (216 lb)  - LMP  (LMP Unknown)  - SpO2 97%  - BMI 40.81 kg/m??     Pt seen on the day of discharge and determined appropriate for discharge.    Condition at Discharge: good    Length of Discharge: I spent greater than 30 mins in the discharge of this patient.

## 2024-06-15 NOTE — Care Plan (Signed)
------------------------------------------------------------------------------- °  Summary: Nursing POC -------------------------------------------------------------------------------  Shift Summary Pain management interventions were provided throughout the shift, resulting in a decrease in reported pain scores from 9 to 7. Fall prevention and safety interventions were consistently implemented, including frequent toileting, environmental modifications, and use of low bed and nonskid footwear. Family visited and provided support during the shift. No new hospital-acquired injuries or abnormal infection-related findings were documented, and the peripheral IV site remained in good condition. Overall, the patient experienced a decrease in pain and maintained safety with ongoing support and interventions.

## 2024-06-16 NOTE — Progress Notes (Signed)
 [facility] Internal Medicine  CHRONIC CARE MANAGEMENT OUTREACH ENCOUNTER         Date of Service:  06/16/2024     Service:  Care Coordination - phone Are you located in [place]? Yes MyChart use by patient is active: yes  Post-outreach Action Items: Provider: Please place order for rollator. CM: Call Pt back today. Patient: Please read CM Nurse's MyChart message.  Follow-up Next Call: PT, Rollator  Chronic Care Management (CCM) Outreach Purpose of outreach: Asthma and Chronic Pain  Care Manager (CM) completed the following: Reviewed chart External medical records, current nursing notes, and previous provider notes  have been read, reviewed and appreciated if available and when applicable to today's CCM call.  Chart review included allergies, current medications, and problem list Identified Resources: CM Nurse completed a Panel Review Meeting today with PCP Panel review today with PCP who requests CM Nurse contact Pt to encourage scheduling with PT in [place]. Pt has been established with PT in [place] but has no future visits scheduled. Pt requesting rollator upon hospital discharge. PCP will place order for rollator.  CM Nurse will offer Pt a hospital follow up visit PCP states Pt can also follow up at upcoming PCP visit scheduled on 07/11/2024  The Internal Medicine [facility] team was unable to complete call with Brandi Bell to follow-up in regards to the Chronic Care Management Program. Pt answers call and states she is standing in line to pay and is unable to talk. Pt requesting callback in 2 hours. Nurse agreed to call Pt back today and sent a follow up MyChart message. This is our second attempt to contact patient.  Health Maintenance:  Health Maintenance Due  Topic Date Due   COVID-19 Vaccine (8 - 2025-26 season) 03/06/2024   Wrap-up Patient was encouraged to reach out to their provider with any questions or concerns Call 911 for medical emergencies  Future Appointments  Date Time  Provider Department Center  07/03/2024 11:00 AM [facility]BR MAMMO RM 2 [facility]  07/03/2024  1:30 PM [facility] US  MAMMO RM [facility]  07/10/2024 11:00 AM Evon, Arland Jansky, PhD [facility]  07/11/2024  2:40 PM Jesus Griffes, MD [facility]  07/23/2024  9:00 AM IC MRI MBL 1 IMRIRLGH [facility]  07/26/2024  8:00 AM Lesleigh Sheppard FALCON, MD UNCPULSPCLET [facility]  08/01/2024  8:45 AM Janith Franky Faden, DO [facility]SPNEURPMR [facility]  08/07/2024  9:45 AM UNCTIF 08 UNCTHERINFET [facility]  11/21/2024  8:00 AM Yoxheimer, Manuelita DASEN, AGNP [facility]  02/06/2025 10:40 AM Mendsen, Ozell Ned, OD O[facility]THTNELS [facility]    A copy of this Patient Outreach/CCM Encounter was sent to patient's Primary Care Provider   CCM Documentation Time spent in direct care with patient and/or health care proxy via non-in-person encounter(s): 9 minute  Time spent in indirect patient care and coordination: 30 minutes

## 2024-06-16 NOTE — Care Plan (Signed)
 Transition of Care Encounter Data   Call attempt: 1 Admission date: 06/08/24 Discharge date: 06/15/24 Discharge diagnosis: Severe episode of recurrent major depressive disorder Patient post discharge: Medications:         [facility]: [id number] [facility]: [phone]:    Other: Contact PCP:       [facility] TRANSITIONAL CASE MANAGEMENT SUMMARY NOTE   Attempted to contact patient today at Cell to complete Transitional Case Management call from [facility]. Left message for patient to return call; direct phone number included in message left for patient; 1st attempt.           Josette JINNY Lout, RN

## 2024-06-18 NOTE — Progress Notes (Signed)
 [facility] ENT Encounter This medical encounter was conducted virtually using [email or web address]  TeleHealth protocols.  Patient ID: Brandi Bell is a 63 y.o. female who presents by video interaction for evaluation.  I have identified myself to the patient and conveyed my credentials to Princella Shawnee Goodell.  Patient has signed informed consent on file in medical record.  Present on Video Call: Is there someone else in the room? No..  Assessment/Plan:   Daliah was seen today for muscle pain.  Diagnoses and all orders for this visit:  Muscle cramp  Able to review most recent hospitalization from 06/08/2024 with discharge date of 06/15/2024.  Patient does have a documented history of neuropathy, fibromyalgia and restless leg syndrome.  She is reporting improved p.o. intake posthospitalization now that she has nausea medicine to take.  Does feel like she staying well-hydrated.  No reported leg swelling or redness to suggest DVT.  It is also bilateral and intermittent cramping which also would be unusual for a DVT.  We did look at the most recent labs together potassium of 3.8 from 06/10/2024 and hemoglobin and hematocrit from 06/08/2024 of 15.9/46.2.  After history and assessment we mutually agreed since she is doing well on the muscle relaxers and has enough that she will continue with that today and schedule follow-up with her PCP tomorrow for recheck of her lab work as a precaution.  Did discuss if pain worsens or she develops any redness or swelling to go to the ER for repeat evaluation.  Patient feels comfortable and has no additional questions at this time.  -- Discussed the new prescription noted above, including potential side effects, drug interactions, instructions for taking the medication, and the consequences of not taking it. -- Patient verbalized an understanding of today's assessment and recommendations, as well as the purpose of ongoing medications.  Follow-up with PCP   Medication  adherence and barriers to the treatment plan have been addressed. Opportunities to optimize healthy behaviors have been discussed. Patient / caregiver voiced understanding.     Subjective:   HPI Brandi Bell is 63 y.o. and presents today in the [facility] with ENT symptoms.  The PCP for this patient is Jesus Griffes, MD. Pt reports she was discharged from the hospital 3 days ago for malnutrition. Has has noctural cramps for about a year but noticed yesterday the bilateral leg cramps got worse. All the way from her thighs to feet. No redness or swelling noted. Tried a muscle relaxer today that did help quite a bit.  Patient reports that she did have low potassium and also has a history of anemia and wanted to make sure those were okay on her last set of labs.  Is not currently take Lasix but she was advised only safe that for when her legs swell due to the history of hypokalemia    ROS Review of Systems   All other ROS per HPI.  I have reviewed the problem list, past medical history, past family history, medications, and allergies and have updated/reconciled them if needed.      Objective:  Physical Exam As part of this Video Visit, no in-person exam was conducted. Video interaction permitted the following observations.  General: No acute distress.  RESP: Relaxed respiratory effort. No conversational dyspnea.  SKIN: No rashes noted. NEURO: Normal coordination.  No tremors observed. PSYCH: Alert and oriented.  Speech fluent and sensible.  Calm affect.      The patient reports they are physically located  in [place]  and is currently: at home. I conducted a audio/video visit. I spent  3m 51s on the video call with the patient. I spent an additional 5 minutes on pre- and post-visit activities on the date of service .

## 2024-06-19 ENCOUNTER — Ambulatory Visit

## 2024-06-19 DIAGNOSIS — M6281 Muscle weakness (generalized): Secondary | ICD-10-CM | POA: Diagnosis present

## 2024-06-19 DIAGNOSIS — M5416 Radiculopathy, lumbar region: Secondary | ICD-10-CM | POA: Diagnosis present

## 2024-06-19 DIAGNOSIS — R296 Repeated falls: Secondary | ICD-10-CM

## 2024-06-19 DIAGNOSIS — R262 Difficulty in walking, not elsewhere classified: Secondary | ICD-10-CM | POA: Diagnosis present

## 2024-06-19 DIAGNOSIS — R5381 Other malaise: Secondary | ICD-10-CM

## 2024-06-19 NOTE — Therapy (Signed)
 OUTPATIENT PHYSICAL THERAPY THORACOLUMBAR Treatment/re-cert through 07/31/24   Patient Name: Brandi Bell MRN: [id number] DOB:1961-06-21, 63 y.o., female Today's Date: 06/19/2024  END OF SESSION:  PT End of Session - 06/19/24 1332     Visit Number 12    Number of Visits 18    Date for Recertification  07/25/24    Authorization Type request 2x/week x 8 weeks (Medicare PN needed at visit #20)    PT Start Time 1330    PT Stop Time 1415    PT Time Calculation (min) 45 min    Activity Tolerance Patient tolerated treatment well    Behavior During Therapy WFL for tasks assessed/performed            Past Medical History:  Diagnosis Date   Anemia    hx of   Anxiety    Arthritis    osteo Back, Feet, Hands   Asthma    [facility] admission - on Bipap - 7/13 - report on paper chart/ flare 3 mos ago   Bipolar disorder (HCC)    Bipolar disorder (HCC)    Bulging lumbar disc    Cancer (HCC)    cervical   Cataract    Depression    Elevated LFTs    having ultrasound [facility] - 12/14/14   GERD (gastroesophageal reflux disease)    vomiting   Headache    migraines, every three or 4 days/ stress related   Heart attack (HCC)    hx of/ Dr. 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, self repaired   History of kidney stones    History of migraine headaches    Hypertension    Echo -1/16 - report on paper chart/controlled on meds   IBS (irritable bowel syndrome)    Microhematuria    Motion sickness    Osteoporosis    Pneumonia    hx of   PONV (postoperative nausea and vomiting)    PTSD (post-traumatic stress disorder)    Restless leg syndrome    Rhinitis    Shortness of breath dyspnea    with exertion   Sleep apnea    central and obstructive/ CPAP   Spinal stenosis of cervical region    Suicide attempt [facility])    Vertigo 6 yrs ago   hx of   Past Surgical History:  Procedure Laterality Date   ANTERIOR AND POSTERIOR REPAIR  2002   done with bladder  tact   BILATERAL SALPINGOOPHORECTOMY  2004   pain from scar tissue   CARDIAC CATHETERIZATION  2008   [facility] - neg - report in paper chart   CATARACT EXTRACTION Bilateral 2007   CHOLECYSTECTOMY     COLONOSCOPY N/A 01/04/2015   Procedure: COLONOSCOPY;  Surgeon: Rogelia Copping, MD;  Location: [facility];  Service: Gastroenterology;  Laterality: N/A;  with biopsies   CYSTOSCOPY W/ RETROGRADES Right 10/26/2016   Procedure: CYSTOSCOPY WITH RETROGRADE PYELOGRAM;  Surgeon: Rosina Riis, MD;  Location: [facility] ORS;  Service: Urology;  Laterality: Right;   CYSTOSCOPY WITH STENT PLACEMENT Left 10/26/2016   Procedure: CYSTOSCOPY WITH STENT PLACEMENT;  Surgeon: Rosina Riis, MD;  Location: [facility] OR[facility];  Service: Urology;  Laterality: Left;   ESOPHAGOGASTRODUODENOSCOPY (EGD) WITH PROPOFOL  N/A 12/30/2015   Procedure: ESOPHAGOGASTRODUODENOSCOPY (EGD) WITH PROPOFOL ;  Surgeon: Rogelia Copping, MD;  Location: [facility];  Service: Endoscopy;  Laterality: N/A;   ESOPHAGOGASTRODUODENOSCOPY (EGD) WITH PROPOFOL  N/A 08/12/2017   Procedure: ESOPHAGOGASTRODUODENOSCOPY (EGD) WITH PROPOFOL ;  Surgeon:  Jinny Carmine, MD;  Location: [facility];  Service: Endoscopy;  Laterality: N/A;  CPAP   FOOT SURGERY     multiple   SEPTOPLASTY     SHOULDER ARTHROSCOPY Bilateral 2008   TONSILLECTOMY  6   TUBAL LIGATION     URETEROSCOPY WITH HOLMIUM LASER LITHOTRIPSY Left 10/26/2016   Procedure: URETEROSCOPY WITH HOLMIUM LASER LITHOTRIPSY;  Surgeon: Rosina Riis, MD;  Location: [facility] OR[facility];  Service: Urology;  Laterality: Left;   VAGINAL HYSTERECTOMY  1989   cervical cancer age 57   Patient Active Problem List   Diagnosis Date Noted   Nausea and vomiting    OSA (obstructive sleep apnea) 02/08/2017   Vitamin D  deficiency 07/21/2016   Hx of suicide attempt 07/19/2016   PTSD (post-traumatic stress disorder) 07/19/2016   Migraine headache 07/19/2016   RLS (restless legs syndrome) 07/01/2016   Osteoporosis 07/01/2016    Trochanteric bursitis 03/17/2016   Prediabetes 03/17/2016   Gastritis    Pituitary adenoma (HCC) 10/02/2015   Borderline personality disorder (HCC) 02/10/2015   Chronic pain syndrome 02/10/2015   Arthritis 02/10/2015   Hx of colonic polyps    Seasonal allergies 12/21/2013   Fibromyalgia 12/21/2013   Moderate persistent asthma without complication 06/20/2013   Chronic nausea 06/20/2013   Bipolar affective disorder (HCC) 06/20/2013   Hx of cervical cancer 07/20/2012   Lumbar herniated disc 07/20/2012   Hyperlipidemia 07/20/2012   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 Dann, MD  REFERRING PROVIDER: Dr. Jesus  REFERRING DIAG:  Diagnosis  M54.31 (ICD-10-CM) - Sciatica, right side  M50.30 (ICD-10-CM) - Other cervical disc degeneration, unspecified cervical region  Z91.81 (ICD-10-CM) - History of falling    Rationale for Evaluation and Treatment: Rehabilitation  THERAPY DIAG:  Muscle weakness (generalized)  Physical deconditioning  Repeated falls  Difficulty in walking, not elsewhere classified  ONSET DATE: August 2025  SUBJECTIVE:                                                                                                                                                                                           SUBJECTIVE STATEMENT: Pt's main concern is she feels weak after multiple health concerns this year and multiple hospital stays (most recent in Aug 2025 for respiratory issues; per char review- recently returned to the hospital due to acute hypoxemic respiratory failure with worsening dyspnea and asthma exacerbation. )  Just had a course of home health PT- finished before outpatient PT   Started medications for breathing- those are helping; starts injection therapy this Friday (Dupixent)  Walking with a SPC, sometimes walker, and sometimes no  cane.   Most recently had a fall 2 weeks ago- physical fatigue she feels like  contributes to it  Difficulty standing stationary (bothers lower back)  Does some walking 100 m driveway, 4x wears a mask for this, feels SOB sometimes    PERTINENT HISTORY:  Just finished home health PT Was hospitalized for respiratory failure in August 2025 Has h/o low back pain- L3-4  Has had shoulder surgery b/l  PAIN:  Are you having pain? Currently 0/10 and at worst 9-10/10  PRECAUTIONS: Fall  RED FLAGS: None   WEIGHT BEARING RESTRICTIONS: No  FALLS:  Has patient fallen in last 6 months? Yes. Number of falls >3  LIVING ENVIRONMENT: Lives with: lives alone Lives in: Mobile home Stairs: 6 steps to enter/exit Has following equipment at home: Single point cane  Has family/sister support nearby where she lives   OCCUPATION: retired; enjoys environmental manager, does puzzles  PLOF: Independent  PATIENT GOALS: to be able to walk, able to hike, able to walk [place] (beach trip planned for October 2025), able to travel again  NEXT MD VISIT: End of September   OBJECTIVE:  Note: Objective measures were completed at Evaluation unless otherwise noted.  DIAGNOSTIC FINDINGS: See chart review  PATIENT SURVEYS:  LEFS 40/80  COGNITION: Overall cognitive status: Within functional limits for tasks assessed     SENSATION: WFL   POSTURE: rounded shoulders   LUMBAR ROM: deferred  AROM eval  Flexion   Extension   Right lateral flexion   Left lateral flexion   Right rotation   Left rotation    (Blank rows = not tested)  LOWER EXTREMITY ROM:   deferred; pt able to transfer sit to stand  Active  Right eval Left eval  Hip flexion    Hip extension    Hip abduction    Hip adduction    Hip internal rotation    Hip external rotation    Knee flexion    Knee extension    Ankle dorsiflexion    Ankle plantarflexion    Ankle inversion    Ankle eversion     (Blank rows = not tested)  LOWER EXTREMITY MMT:    MMT Right eval Left eval  Hip flexion 4 4   Hip extension    Hip abduction    Hip adduction    Hip internal rotation    Hip external rotation    Knee flexion 4 4  Knee extension 4 4  Ankle dorsiflexion 4 4  Ankle plantarflexion    Ankle inversion    Ankle eversion     (Blank rows = not tested)   FUNCTIONAL TESTS:  5 times sit to stand: 9 seconds today   SLS: 3-5 seconds each LE   TUG 11 seconds today GAIT: Pt amb with SPC in clinic today  TREATMENT DATE: 06/19/24 Subjective:  Pt presents today after recent multiple trips to ER/urgent care/hospital admission and DC.  She reports she checked herself out of hospital at last stay.  She is using a front wheeled walker.  She is staying with her sister as she got evicted from her home.  Having housing/food insecurity concerns.  Her goal is to get stronger and be able to walk without walker again.  Has regular scheduled apt with her psychiatrist this week as she is having difficulty with mental health concerns too.   Objective: Goals updated: see below LEFS: 30  Therapeutic Exercise: modified today Nustep level 3 x 10 minutes, seat #7-  not today SKTC x 5 with PT ea- not today Hooklying LTR x 5 ea- not today SLR with PT assist x 10 ea- not today Seated marches x 10 ea- not today LAQ: 3# 2x10 ea Hip abd with blue TB: 5 second holds x 20 Hip add with ball (sitting): 5 second holds x 20 Bridges: 2x5 partial ROM- not today Seated lumbar flexion x5, 2 sets- not today  Gentle AROM within available ROM for R shoulder flexion x 10 and abd x10 and ER/IR x10; elbow flex/extension x10, cervical rotation R/L x10 Scapular retraction x 10 Diaphragmatic breathing x10 Hip abd with PT 5 seconds x 20 Hip add with PT 5 seconds x 20  Vitals check during session: SpO2 99% HR 80 bpm  Therapeutic Activities: modified today Squats with chair behind: x 10, 2 sets- not today Amb in hallway: with FWW x 5 min (PT close supervision with gait belt) Sit to stand x 10, 2 sets Standing side  stepping in parallel bars 5 laps, over hurdles- not today Tandem stance on airex:4x 20 sec- not today Feet together on airex: 1 min- not today Tandem walk x 3 laps- not today Front step ups: x10 R x10 L (6 inch)- not today Heel raises 2x10- not today Standing hip abd 2x10 ea- not today   Not today 6 min walk test: able to complete 3:45 minutes (8x43ft total=560 ft) then needed a sitting break due to R hip mm fatigue; test ended- not today   PATIENT EDUCATION:  Education details: HEP, PT POC/goals Person educated: Patient Education method: Explanation Education comprehension: verbalized understanding  HOME EXERCISE PROGRAM: To be formally initiated at visit #2, encouraged pt to continue with current HEP and add front step up intervals to HEP, 3x 30 sec after her walks for LE strength/standing endurance  ASSESSMENT:  CLINICAL IMPRESSION: Patient arrived to PT with her FWW today.  Vital signs safe for exercise today.  She has had multiple trips to ER recently, hospitalization, and what she describes as self DC from hospital.  Also experiencing recent changes in social drivers of health (housing/food insecurity). She is staying with her sister.  She expresses concerns about mental health and has regular scheduled appointments with her psychiatrist and therapist.  Overall, she would benefit from continued skilled PT interventions to improve strength, activity tolerance, and improve her safety/reduce fall risks to facilitate improved QOL.  Updated goals today.  OBJECTIVE IMPAIRMENTS: decreased activity tolerance, decreased balance, difficulty walking, decreased strength, impaired perceived functional ability, and pain.   ACTIVITY LIMITATIONS: standing, squatting, stairs, transfers, and locomotion level  PARTICIPATION LIMITATIONS: meal prep, cleaning, laundry, shopping, community activity, and yard work  PERSONAL FACTORS: Past/current experiences, Time since onset of  injury/illness/exacerbation, and 1-2 comorbidities: see PMH are also affecting patient's functional outcome.   REHAB POTENTIAL: Good  CLINICAL DECISION MAKING: Evolving/moderate complexity  EVALUATION COMPLEXITY: Moderate   GOALS: Goals reviewed with patient? Yes  SHORT TERM GOALS: Target date: 04/05/24  Complete 6 min walk test to establish baseline endurance for amb  Baseline:at visit #2; 06/19/24: 5 min duration, not 6 min yet Goal status: IN progress   LONG TERM GOALS: Target date: 08/01/23  Improve LEFS >10 points indicating pt is less significantly limited in her ability to perform her daily activities Baseline: 40; 12/15: 30 Goal status: IN progress  2.  Pt will be able to amb on even/uneven surfaces x 15 min without AD to do errands/grocery shopping independently Baseline: SPC used for amb on flat  surfaces, 12/15: pt using rolling walker now after recent hospitalization Goal status: IN progress  3.  Pt will be able to perform >20 min consecutive standing housechores to complete her housework/meal prep/laundry without being limited by fatigue/SOB Baseline: 10 min intervals Goal status: IN progress   PLAN:  PT FREQUENCY: 2x/week  PT DURATION: 6 weeks  PLANNED INTERVENTIONS: 97110-Therapeutic exercises, 97530- Therapeutic activity, [id number]- Neuromuscular re-education, 97535- Self Care, 02859- Manual therapy, and Patient/Family education.  PLAN FOR NEXT SESSION: continue with tx emphasizing endurance/conditioning, LE strengthening, stair intervals, walking program, 6 min walk test attempt  Vernell Reges, PT, DPT, OCS  Christia Coaxum E Zayda Angell, PT 06/19/2024, 1:35 PM

## 2024-06-20 NOTE — Progress Notes (Signed)
 [facility] Internal Medicine  CHRONIC CARE MANAGEMENT OUTREACH ENCOUNTER         Date of Service:  06/20/2024     Service:  Care Coordination - phone Is there someone else in the room? No.  Are you located in [place]? Yes MyChart use by patient is active: yes  Post-outreach Action Items: Provider: No/none. CM: Shared video on how to get help during a mental health crisis. Patient: Please call [phone] to schedule with [facility] for evaluation of respiratory symptoms.  Follow-up Next Call: January CCM Call  Chronic Care Management (CCM) Outreach Purpose of outreach: Asthma and Chronic Pain  Care Manager (CM) completed the following: Reviewed chart External medical records, current nursing notes, and previous provider notes  have been read, reviewed and appreciated if available and when applicable to today's CCM call.  Chart review included allergies, current medications, and problem list Identified Resources:   Currently seeing Dr. Randine Chow, [facility]  Office#: [phone], appointments [phone] Cell#: [phone] Currently seeing therapist Lizette Pottoff, LCSW, LCAS in the above office  Updates New concerns or symptoms: Yes: Pt states she feels she may be getting sick. Pt reports early this morning she began with a headache, nausea, nasal congestion, scratchy throat, and chills. Pt denies cough or SOB. Pt declines a visit with SDC. Pt prefers to have her sister buy her a Covid test. CM Nurse advises Pt to be tested for Flu as well. CM Nurse provided phone number for scheduling a visit with [facility]. Pt states she will call back if she tests positive for Covid as she would want Paxlovid. CM Nurse provided advice on symptom management and encouraged visit with [facility] for testing and evaluation of respiratory symptoms.  Updates on ongoing concerns/symptoms: Pt is A & O x 3 and sounds tired on call Pt states she was resting as she is feeling under the weather Pt reports  her mood is better today Pt states she was able to see Dr. Chow yesterday and has a scheduled visit on Friday  Pt  reports she has a video visit with Lazette P. Today at 5 pm Olam Jacinto RHODY placed call yesterday to Dr. Chow requesting confirmation of  Pt's appointments  Health Maintenance:  Health Maintenance Due  Topic Date Due   COVID-19 Vaccine (8 - 2025-26 season) 03/06/2024   Wrap-up Patient was encouraged to reach out to their provider with any questions or concerns Pt will call 911 for medical emergencies and [phone] for crisis hotline Future Appointments  Date Time Provider Department Center  07/03/2024 11:00 AM [facility] MAMMO RM 2 [facility]MO [facility]  07/03/2024  1:30 PM [facility] US  MAMMO RM [facility]  07/11/2024  2:40 PM Jesus Griffes, MD [facility]  07/26/2024  8:00 AM Lesleigh Sheppard FALCON, MD UN[facility]ULSPCLET [facility]  08/01/2024  8:45 AM Janith Franky Faden, DO [facility]NEURPMR [facility]  08/07/2024  9:45 AM UNCTIF 08 UNCTHERINFET [facility]  11/21/2024  8:00 AM Yoxheimer, Manuelita DASEN, AGNP [facility]  02/06/2025 10:40 AM Mendsen, Ozell Ned, OD OPHTHTNELS [facility]    A copy of this Patient Outreach/CCM Encounter was sent to patient's Primary Care Provider   CCM Documentation Time spent in direct care with patient and/or health care proxy via non-in-person encounter(s): 6 minutes  Time spent in indirect patient care and coordination: 20 minutes

## 2024-06-21 ENCOUNTER — Ambulatory Visit

## 2024-06-21 DIAGNOSIS — R296 Repeated falls: Secondary | ICD-10-CM

## 2024-06-21 DIAGNOSIS — M6281 Muscle weakness (generalized): Secondary | ICD-10-CM

## 2024-06-21 DIAGNOSIS — R5381 Other malaise: Secondary | ICD-10-CM

## 2024-06-21 DIAGNOSIS — M5416 Radiculopathy, lumbar region: Secondary | ICD-10-CM

## 2024-06-21 DIAGNOSIS — R262 Difficulty in walking, not elsewhere classified: Secondary | ICD-10-CM

## 2024-06-21 NOTE — Therapy (Signed)
 OUTPATIENT PHYSICAL THERAPY THORACOLUMBAR Treatment/re-cert through 07/31/24   Patient Name: Brandi Bell MRN: [id number] DOB:June 11, 1961, 63 y.o., female Today's Date: 06/21/2024  END OF SESSION:  PT End of Session - 06/21/24 1627     Visit Number 13    Number of Visits 24    Date for Recertification  07/31/24    Authorization Type request 2x/week x 6 weeks (Medicare PN needed at visit #20)    PT Start Time 1520    PT Stop Time 1605    PT Time Calculation (min) 45 min    Activity Tolerance Patient tolerated treatment well    Behavior During Therapy WFL for tasks assessed/performed            Past Medical History:  Diagnosis Date   Anemia    hx of   Anxiety    Arthritis    osteo Back, Feet, Hands   Asthma    [facility] admission - on Bipap - 7/13 - report on paper chart/ flare 3 mos ago   Bipolar disorder (HCC)    Bipolar disorder (HCC)    Bulging lumbar disc    Cancer (HCC)    cervical   Cataract    Depression    Elevated LFTs    having ultrasound [facility] - 12/14/14   GERD (gastroesophageal reflux disease)    vomiting   Headache    migraines, every three or 4 days/ stress related   Heart attack (HCC)    hx of/ Dr. 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, self repaired   History of kidney stones    History of migraine headaches    Hypertension    Echo -1/16 - report on paper chart/controlled on meds   IBS (irritable bowel syndrome)    Microhematuria    Motion sickness    Osteoporosis    Pneumonia    hx of   PONV (postoperative nausea and vomiting)    PTSD (post-traumatic stress disorder)    Restless leg syndrome    Rhinitis    Shortness of breath dyspnea    with exertion   Sleep apnea    central and obstructive/ CPAP   Spinal stenosis of cervical region    Suicide attempt [facility])    Vertigo 6 yrs ago   hx of   Past Surgical History:  Procedure Laterality Date   ANTERIOR AND POSTERIOR REPAIR  2002   done with bladder  tact   BILATERAL SALPINGOOPHORECTOMY  2004   pain from scar tissue   CARDIAC CATHETERIZATION  2008   [facility] - neg - report in paper chart   CATARACT EXTRACTION Bilateral 2007   CHOLECYSTECTOMY     COLONOSCOPY N/A 01/04/2015   Procedure: COLONOSCOPY;  Surgeon: Rogelia Copping, MD;  Location: [facility];  Service: Gastroenterology;  Laterality: N/A;  with biopsies   CYSTOSCOPY W/ RETROGRADES Right 10/26/2016   Procedure: CYSTOSCOPY WITH RETROGRADE PYELOGRAM;  Surgeon: Rosina Riis, MD;  Location: [facility];  Service: Urology;  Laterality: Right;   CYSTOSCOPY WITH STENT PLACEMENT Left 10/26/2016   Procedure: CYSTOSCOPY WITH STENT PLACEMENT;  Surgeon: Rosina Riis, MD;  Location: [facility];  Service: Urology;  Laterality: Left;   ESOPHAGOGASTRODUODENOSCOPY (EGD) WITH PROPOFOL  N/A 12/30/2015   Procedure: ESOPHAGOGASTRODUODENOSCOPY (EGD) WITH PROPOFOL ;  Surgeon: Rogelia Copping, MD;  Location: [facility];  Service: Endoscopy;  Laterality: N/A;   ESOPHAGOGASTRODUODENOSCOPY (EGD) WITH PROPOFOL  N/A 08/12/2017   Procedure: ESOPHAGOGASTRODUODENOSCOPY (EGD) WITH PROPOFOL ;  Surgeon:  Jinny Carmine, MD;  Location: [facility];  Service: Endoscopy;  Laterality: N/A;  CPAP   FOOT SURGERY     multiple   SEPTOPLASTY     SHOULDER ARTHROSCOPY Bilateral 2008   TONSILLECTOMY  6   TUBAL LIGATION     URETEROSCOPY WITH HOLMIUM LASER LITHOTRIPSY Left 10/26/2016   Procedure: URETEROSCOPY WITH HOLMIUM LASER LITHOTRIPSY;  Surgeon: Rosina Riis, MD;  Location: [facility];  Service: Urology;  Laterality: Left;   VAGINAL HYSTERECTOMY  1989   cervical cancer age 41   Patient Active Problem List   Diagnosis Date Noted   Nausea and vomiting    OSA (obstructive sleep apnea) 02/08/2017   Vitamin D  deficiency 07/21/2016   Hx of suicide attempt 07/19/2016   PTSD (post-traumatic stress disorder) 07/19/2016   Migraine headache 07/19/2016   RLS (restless legs syndrome) 07/01/2016   Osteoporosis 07/01/2016    Trochanteric bursitis 03/17/2016   Prediabetes 03/17/2016   Gastritis    Pituitary adenoma (HCC) 10/02/2015   Borderline personality disorder (HCC) 02/10/2015   Chronic pain syndrome 02/10/2015   Arthritis 02/10/2015   Hx of colonic polyps    Seasonal allergies 12/21/2013   Fibromyalgia 12/21/2013   Moderate persistent asthma without complication 06/20/2013   Chronic nausea 06/20/2013   Bipolar affective disorder (HCC) 06/20/2013   Hx of cervical cancer 07/20/2012   Lumbar herniated disc 07/20/2012   Hyperlipidemia 07/20/2012   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 Dann, MD  REFERRING PROVIDER: Dr. Jesus  REFERRING DIAG:  Diagnosis  M54.31 (ICD-10-CM) - Sciatica, right side  M50.30 (ICD-10-CM) - Other cervical disc degeneration, unspecified cervical region  Z91.81 (ICD-10-CM) - History of falling    Rationale for Evaluation and Treatment: Rehabilitation  THERAPY DIAG:  Muscle weakness (generalized)  Physical deconditioning  Repeated falls  Difficulty in walking, not elsewhere classified  Radiculopathy, lumbar region  ONSET DATE: August 2025  SUBJECTIVE:                                                                                                                                                                                           SUBJECTIVE STATEMENT: Pt's main concern is she feels weak after multiple health concerns this year and multiple hospital stays (most recent in Aug 2025 for respiratory issues; per char review- recently returned to the hospital due to acute hypoxemic respiratory failure with worsening dyspnea and asthma exacerbation. )  Just had a course of home health PT- finished before outpatient PT   Started medications for breathing- those are helping; starts injection therapy this Friday (Dupixent)  Walking with a SPC, sometimes  walker, and sometimes no cane.   Most recently had a fall 2 weeks ago- physical  fatigue she feels like contributes to it  Difficulty standing stationary (bothers lower back)  Does some walking 100 m driveway, 4x wears a mask for this, feels SOB sometimes    PERTINENT HISTORY:  Just finished home health PT Was hospitalized for respiratory failure in August 2025 Has h/o low back pain- L3-4  Has had shoulder surgery b/l  PAIN:  Are you having pain? Currently 0/10 and at worst 9-10/10  PRECAUTIONS: Fall  RED FLAGS: None   WEIGHT BEARING RESTRICTIONS: No  FALLS:  Has patient fallen in last 6 months? Yes. Number of falls >3  LIVING ENVIRONMENT: Lives with: lives alone Lives in: Mobile home Stairs: 6 steps to enter/exit Has following equipment at home: Single point cane  Has family/sister support nearby where she lives   OCCUPATION: retired; enjoys environmental manager, does puzzles  PLOF: Independent  PATIENT GOALS: to be able to walk, able to hike, able to [place] (beach trip planned for October 2025), able to travel again  NEXT MD VISIT: End of September   OBJECTIVE:  Note: Objective measures were completed at Evaluation unless otherwise noted.  DIAGNOSTIC FINDINGS: See chart review  PATIENT SURVEYS:  LEFS 40/80  COGNITION: Overall cognitive status: Within functional limits for tasks assessed     SENSATION: WFL   POSTURE: rounded shoulders   LUMBAR ROM: deferred  AROM eval  Flexion   Extension   Right lateral flexion   Left lateral flexion   Right rotation   Left rotation    (Blank rows = not tested)  LOWER EXTREMITY ROM:   deferred; pt able to transfer sit to stand  Active  Right eval Left eval  Hip flexion    Hip extension    Hip abduction    Hip adduction    Hip internal rotation    Hip external rotation    Knee flexion    Knee extension    Ankle dorsiflexion    Ankle plantarflexion    Ankle inversion    Ankle eversion     (Blank rows = not tested)  LOWER EXTREMITY MMT:    MMT Right eval Left eval   Hip flexion 4 4  Hip extension    Hip abduction    Hip adduction    Hip internal rotation    Hip external rotation    Knee flexion 4 4  Knee extension 4 4  Ankle dorsiflexion 4 4  Ankle plantarflexion    Ankle inversion    Ankle eversion     (Blank rows = not tested)   FUNCTIONAL TESTS:  5 times sit to stand: 9 seconds today   SLS: 3-5 seconds each LE   TUG 11 seconds today GAIT: Pt amb with SPC in clinic today  TREATMENT DATE: 06/19/24 Subjective:  Pt states she got a rollator walker, having trouble lifting/opening/closing it with getting it into and out of her car.  She feels like her R hand is weaker since straining it when falling.    Objective: Goals updated: see below LEFS: 30 Grip strength: R 48 lbs, L 66 lbs (R hand dominant)  Therapeutic Exercise: modified today Nustep level 3 x 10 minutes, seat #7- not today SKTC x 5 with PT ea- not today Hooklying LTR x 5 ea- not today SLR with PT assist x 10 ea- not today Seated marches x 10 ea- not today LAQ: 3# 2x10 ea  Hip abd with blue TB: 5 second holds x 20 Hip add with ball (sitting): 5 second holds x 20 Bridges: 2x5 partial ROM- not today Seated lumbar flexion x5, 2 sets- not today  Gentle AROM within available ROM for R shoulder flexion x 10 and abd x10 and ER/IR x10; elbow flex/extension x10, cervical rotation R/L x10 Scapular retraction x 10 Diaphragmatic breathing x10 Hip abd with PT 5 seconds x 20 Hip add with PT 5 seconds x 20  Vitals check during session: SpO2 99% HR 80 bpm  Therapeutic Activities: modified today Squats with chair behind: x 10, 2 sets- not today Amb in hallway: with FWW x 4 min (PT close supervision with gait belt), brief rest break then 1:30 min Sit to stand x 10, 2 sets- only 5 reps today Standing side stepping in parallel bars 5 laps, over hurdles- not today Tandem stance on airex:4x 20 sec- not today Feet together on airex: 1 min- not today Tandem walk x 3 laps- not  today Front step ups: x10 R x10 L (6 inch)- not today Heel raises 2x10- not today Standing hip abd 2x10 ea- not today Push/pulling with dowel: 2x10, 2 sets with PT manual resistance   Not today 6 min walk test: able to complete 3:45 minutes (8x68ft total=560 ft) then needed a sitting break due to R hip mm fatigue; test ended- not today   PATIENT EDUCATION:  Education details: HEP, PT POC/goals Person educated: Patient Education method: Explanation Education comprehension: verbalized understanding  HOME EXERCISE PROGRAM: To be formally initiated at visit #2, encouraged pt to continue with current HEP and add front step up intervals to HEP, 3x 30 sec after her walks for LE strength/standing endurance  ASSESSMENT:  CLINICAL IMPRESSION: Patient arrived to PT with her FWW today.  Vital signs safe for exercise today.  She has had multiple trips to ER recently, hospitalization, and what she describes as self DC from hospital.  Also experiencing recent changes in social drivers of health (housing/food insecurity). She is staying with her sister.  She expresses concerns about mental health and has regular scheduled appointments with her psychiatrist and therapist.  Dominant grip strength weakness noted compared to non dominant hand.  Added grip strengthening exercises into tx plan today as functionally she is having difficulty lifting/opening/closing her rollator walker to get it in/out of her car.  Overall, she would benefit from continued skilled PT interventions to improve strength, activity tolerance, and improve her safety/reduce fall risks to facilitate improved QOL.  Updated goals today.  OBJECTIVE IMPAIRMENTS: decreased activity tolerance, decreased balance, difficulty walking, decreased strength, impaired perceived functional ability, and pain.   ACTIVITY LIMITATIONS: standing, squatting, stairs, transfers, and locomotion level  PARTICIPATION LIMITATIONS: meal prep, cleaning, laundry,  shopping, community activity, and yard work  PERSONAL FACTORS: Past/current experiences, Time since onset of injury/illness/exacerbation, and 1-2 comorbidities: see PMH are also affecting patient's functional outcome.   REHAB POTENTIAL: Good  CLINICAL DECISION MAKING: Evolving/moderate complexity  EVALUATION COMPLEXITY: Moderate   GOALS: Goals reviewed with patient? Yes  SHORT TERM GOALS: Target date: 04/05/24  Complete 6 min walk test to establish baseline endurance for amb  Baseline:at visit #2; 06/19/24: 5 min duration, not 6 min yet Goal status: IN progress   LONG TERM GOALS: Target date: 08/01/23  Improve LEFS >10 points indicating pt is less significantly limited in her ability to perform her daily activities Baseline: 40; 12/15: 30 Goal status: IN progress  2.  Pt will be able to amb  on even/uneven surfaces x 15 min without AD to do errands/grocery shopping independently Baseline: SPC used for amb on flat surfaces, 12/15: pt using rolling walker now after recent hospitalization Goal status: IN progress  3.  Pt will be able to perform >20 min consecutive standing housechores to complete her housework/meal prep/laundry without being limited by fatigue/SOB Baseline: 10 min intervals Goal status: IN progress   PLAN:  PT FREQUENCY: 2x/week  PT DURATION: 6 weeks  PLANNED INTERVENTIONS: 97110-Therapeutic exercises, 97530- Therapeutic activity, [id number]- Neuromuscular re-education, 97535- Self Care, [id number]- Manual therapy, and Patient/Family education.  PLAN FOR NEXT SESSION: continue with tx emphasizing endurance/conditioning, LE strengthening, stair intervals, walking program, 6 min walk test attempt  Vernell Reges, PT, DPT, OCS  Trea Carnegie E Endia Moncur, PT 06/21/2024, 4:27 PM

## 2024-06-26 ENCOUNTER — Ambulatory Visit

## 2024-06-26 DIAGNOSIS — R262 Difficulty in walking, not elsewhere classified: Secondary | ICD-10-CM

## 2024-06-26 DIAGNOSIS — R5381 Other malaise: Secondary | ICD-10-CM

## 2024-06-26 DIAGNOSIS — M6281 Muscle weakness (generalized): Secondary | ICD-10-CM | POA: Diagnosis not present

## 2024-06-26 DIAGNOSIS — R296 Repeated falls: Secondary | ICD-10-CM

## 2024-06-26 NOTE — Therapy (Signed)
 " OUTPATIENT PHYSICAL THERAPY THORACOLUMBAR Treatment/re-cert through 07/31/24   Patient Name: Brandi Bell MRN: [id number] DOB:1961/04/03, 63 y.o., female Today's Date: 06/26/2024  END OF SESSION:  PT End of Session - 06/26/24 1053     Visit Number 14    Number of Visits 24    Date for Recertification  07/31/24    Authorization Type request 2x/week x 6 weeks (Medicare PN needed at visit #20)    PT Start Time 0900    PT Stop Time 0945    PT Time Calculation (min) 45 min    Activity Tolerance Patient tolerated treatment well    Behavior During Therapy WFL for tasks assessed/performed            Past Medical History:  Diagnosis Date   Anemia    hx of   Anxiety    Arthritis    osteo Back, Feet, Hands   Asthma    [facility] admission - on Bipap - 7/13 - report on paper chart/ flare 3 mos ago   Bipolar disorder (HCC)    Bipolar disorder (HCC)    Bulging lumbar disc    Cancer (HCC)    cervical   Cataract    Depression    Elevated LFTs    having ultrasound [facility] - 12/14/14   GERD (gastroesophageal reflux disease)    vomiting   Headache    migraines, every three or 4 days/ stress related   Heart attack (HCC)    hx of/ Dr. 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, self repaired   History of kidney stones    History of migraine headaches    Hypertension    Echo -1/16 - report on paper chart/controlled on meds   IBS (irritable bowel syndrome)    Microhematuria    Motion sickness    Osteoporosis    Pneumonia    hx of   PONV (postoperative nausea and vomiting)    PTSD (post-traumatic stress disorder)    Restless leg syndrome    Rhinitis    Shortness of breath dyspnea    with exertion   Sleep apnea    central and obstructive/ CPAP   Spinal stenosis of cervical region    Suicide attempt [facility])    Vertigo 6 yrs ago   hx of   Past Surgical History:  Procedure Laterality Date   ANTERIOR AND POSTERIOR REPAIR  2002   done with bladder  tact   BILATERAL SALPINGOOPHORECTOMY  2004   pain from scar tissue   CARDIAC CATHETERIZATION  2008   [facility] - neg - report in paper chart   CATARACT EXTRACTION Bilateral 2007   CHOLECYSTECTOMY     COLONOSCOPY N/A 01/04/2015   Procedure: COLONOSCOPY;  Surgeon: Rogelia Copping, MD;  Location: [facility];  Service: Gastroenterology;  Laterality: N/A;  with biopsies   CYSTOSCOPY W/ RETROGRADES Right 10/26/2016   Procedure: CYSTOSCOPY WITH RETROGRADE PYELOGRAM;  Surgeon: Rosina Riis, MD;  Location: [facility];  Service: Urology;  Laterality: Right;   CYSTOSCOPY WITH STENT PLACEMENT Left 10/26/2016   Procedure: CYSTOSCOPY WITH STENT PLACEMENT;  Surgeon: Rosina Riis, MD;  Location: [facility];  Service: Urology;  Laterality: Left;   ESOPHAGOGASTRODUODENOSCOPY (EGD) WITH PROPOFOL  N/A 12/30/2015   Procedure: ESOPHAGOGASTRODUODENOSCOPY (EGD) WITH PROPOFOL ;  Surgeon: Rogelia Copping, MD;  Location: [facility];  Service: Endoscopy;  Laterality: N/A;   ESOPHAGOGASTRODUODENOSCOPY (EGD) WITH PROPOFOL  N/A 08/12/2017   Procedure: ESOPHAGOGASTRODUODENOSCOPY (EGD) WITH PROPOFOL ;  Surgeon: Jinny Carmine, MD;  Location: [facility];  Service: Endoscopy;  Laterality: N/A;  CPAP   FOOT SURGERY     multiple   SEPTOPLASTY     SHOULDER ARTHROSCOPY Bilateral 2008   TONSILLECTOMY  6   TUBAL LIGATION     URETEROSCOPY WITH HOLMIUM LASER LITHOTRIPSY Left 10/26/2016   Procedure: URETEROSCOPY WITH HOLMIUM LASER LITHOTRIPSY;  Surgeon: Rosina Riis, MD;  Location: [facility];  Service: Urology;  Laterality: Left;   VAGINAL HYSTERECTOMY  1989   cervical cancer age 18   Patient Active Problem List   Diagnosis Date Noted   Nausea and vomiting    OSA (obstructive sleep apnea) 02/08/2017   Vitamin D  deficiency 07/21/2016   Hx of suicide attempt 07/19/2016   PTSD (post-traumatic stress disorder) 07/19/2016   Migraine headache 07/19/2016   RLS (restless legs syndrome) 07/01/2016   Osteoporosis 07/01/2016    Trochanteric bursitis 03/17/2016   Prediabetes 03/17/2016   Gastritis    Pituitary adenoma (HCC) 10/02/2015   Borderline personality disorder (HCC) 02/10/2015   Chronic pain syndrome 02/10/2015   Arthritis 02/10/2015   Hx of colonic polyps    Seasonal allergies 12/21/2013   Fibromyalgia 12/21/2013   Moderate persistent asthma without complication 06/20/2013   Chronic nausea 06/20/2013   Bipolar affective disorder (HCC) 06/20/2013   Hx of cervical cancer 07/20/2012   Lumbar herniated disc 07/20/2012   Hyperlipidemia 07/20/2012   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 Dann, MD  REFERRING PROVIDER: Dr. Jesus  REFERRING DIAG:  Diagnosis  M54.31 (ICD-10-CM) - Sciatica, right side  M50.30 (ICD-10-CM) - Other cervical disc degeneration, unspecified cervical region  Z91.81 (ICD-10-CM) - History of falling    Rationale for Evaluation and Treatment: Rehabilitation  THERAPY DIAG:  Muscle weakness (generalized)  Physical deconditioning  Repeated falls  Difficulty in walking, not elsewhere classified  ONSET DATE: August 2025  SUBJECTIVE:                                                                                                                                                                                           SUBJECTIVE STATEMENT: Pt's main concern is she feels weak after multiple health concerns this year and multiple hospital stays (most recent in Aug 2025 for respiratory issues; per char review- recently returned to the hospital due to acute hypoxemic respiratory failure with worsening dyspnea and asthma exacerbation. )  Just had a course of home health PT- finished before outpatient PT   Started medications for breathing- those are helping; starts injection therapy this Friday (Dupixent)  Walking with a SPC, sometimes walker, and sometimes  no cane.   Most recently had a fall 2 weeks ago- physical fatigue she feels like  contributes to it  Difficulty standing stationary (bothers lower back)  Does some walking 100 m driveway, 4x wears a mask for this, feels SOB sometimes    PERTINENT HISTORY:  Just finished home health PT Was hospitalized for respiratory failure in August 2025 Has h/o low back pain- L3-4  Has had shoulder surgery b/l  PAIN:  Are you having pain? Currently 0/10 and at worst 9-10/10  PRECAUTIONS: Fall  RED FLAGS: None   WEIGHT BEARING RESTRICTIONS: No  FALLS:  Has patient fallen in last 6 months? Yes. Number of falls >3  LIVING ENVIRONMENT: Lives with: lives alone Lives in: Mobile home Stairs: 6 steps to enter/exit Has following equipment at home: Single point cane  Has family/sister support nearby where she lives   OCCUPATION: retired; enjoys environmental manager, does puzzles  PLOF: Independent  PATIENT GOALS: to be able to walk, able to hike, able to walk [place] (beach trip planned for October 2025), able to travel again  NEXT MD VISIT: End of September   OBJECTIVE:  Note: Objective measures were completed at Evaluation unless otherwise noted.  DIAGNOSTIC FINDINGS: See chart review  PATIENT SURVEYS:  LEFS 40/80  COGNITION: Overall cognitive status: Within functional limits for tasks assessed     SENSATION: WFL   POSTURE: rounded shoulders   LUMBAR ROM: deferred  AROM eval  Flexion   Extension   Right lateral flexion   Left lateral flexion   Right rotation   Left rotation    (Blank rows = not tested)  LOWER EXTREMITY ROM:   deferred; pt able to transfer sit to stand  Active  Right eval Left eval  Hip flexion    Hip extension    Hip abduction    Hip adduction    Hip internal rotation    Hip external rotation    Knee flexion    Knee extension    Ankle dorsiflexion    Ankle plantarflexion    Ankle inversion    Ankle eversion     (Blank rows = not tested)  LOWER EXTREMITY MMT:    MMT Right eval Left eval  Hip flexion 4 4   Hip extension    Hip abduction    Hip adduction    Hip internal rotation    Hip external rotation    Knee flexion 4 4  Knee extension 4 4  Ankle dorsiflexion 4 4  Ankle plantarflexion    Ankle inversion    Ankle eversion     (Blank rows = not tested)   FUNCTIONAL TESTS:  5 times sit to stand: 9 seconds today   SLS: 3-5 seconds each LE   TUG 11 seconds today GAIT: Pt amb with SPC in clinic today  TREATMENT DATE: 06/26/24 Subjective:  Pt states she figured out a system for how to transfer her rollator in/out of the car.  No falls.  She is staying with family (Mom, sister, daughter).  Had a good appointment with psychiatry this past week.  Objective: Goals updated: see below LEFS: 30 Grip strength: R 48 lbs, L 66 lbs (R hand dominant)  Therapeutic Exercise: modified today Nustep level 3 x 10 minutes, seat #7- not today SKTC x 5 with PT ea- not today Hooklying LTR x 5 ea- not today SLR with PT assist x 10 ea- not today Seated marches x 10 ea- not today LAQ: 2x10 ea no  weight today Hip abd with blue TB: 5 second holds x 20 Hip add with ball (sitting): 5 second holds x 20 Bridges: 2x5 partial ROM- not today Seated lumbar flexion x5, 2 sets- not today  Gentle AROM within available ROM for R shoulder flexion x 10 and abd x10 and ER/IR x10; elbow flex/extension x10, cervical rotation R/L x10 Scapular retraction x 10, 2 sets Diaphragmatic breathing x10 Hip abd with PT 5 seconds x 20 Hip add with PT 5 seconds x 20 Clothes pin (yellow) grip exercises- index+middle and thumb grip 30 second intervals x 3- not today   Neuro re-ed: Tandem stance on airex:4x 20 sec- ea direction Feet together on airex: 1 min with intervals of taking hands off railing  Therapeutic Activities:  Squats with chair behind: 2x5, x5, x8 (25 reps total during session) Amb in hallway: with rollator (500 ft) Sit to stand x 5 Standing side stepping in parallel bars 5 laps, over hurdles- no hrudles  today Tandem walk x 3 laps- not today Front step ups: x10 R x10 L (6 inch)- not today Heel raises 2x10, toe raises 2x10 Standing hip abd 2x10 ea- not today Standing feet together with reaching R/L for cone outside BOS with PT above/at/below shoulder height (PT supervision close with gait belt) Push/pulling with dowel: 2x10, 2 sets with PT manual resistance- not today   Not today 6 min walk test: able to complete 3:45 minutes (8x50ft total=560 ft) then needed a sitting break due to R hip mm fatigue; test ended- not today   PATIENT EDUCATION:  Education details: HEP, PT POC/goals Person educated: Patient Education method: Explanation Education comprehension: verbalized understanding  HOME EXERCISE PROGRAM: To be formally initiated at visit #2, encouraged pt to continue with current HEP and add front step up intervals to HEP, 3x 30 sec after her walks for LE strength/standing endurance  ASSESSMENT:  CLINICAL IMPRESSION: Patient arrived to PT with her new rollator today.  Able to amb with upright posture using rollator on flat surfaces in clinic x 500 ft.  She was able to perform 25 squats today which is an improvement from last week.  Overall, she would benefit from continued skilled PT interventions to improve strength, activity tolerance, and improve her safety/reduce fall risks to facilitate improved QOL.  OBJECTIVE IMPAIRMENTS: decreased activity tolerance, decreased balance, difficulty walking, decreased strength, impaired perceived functional ability, and pain.   ACTIVITY LIMITATIONS: standing, squatting, stairs, transfers, and locomotion level  PARTICIPATION LIMITATIONS: meal prep, cleaning, laundry, shopping, community activity, and yard work  PERSONAL FACTORS: Past/current experiences, Time since onset of injury/illness/exacerbation, and 1-2 comorbidities: see PMH are also affecting patient's functional outcome.   REHAB POTENTIAL: Good  CLINICAL DECISION MAKING:  Evolving/moderate complexity  EVALUATION COMPLEXITY: Moderate   GOALS: Goals reviewed with patient? Yes  SHORT TERM GOALS: Target date: 04/05/24  Complete 6 min walk test to establish baseline endurance for amb  Baseline:at visit #2; 06/19/24: 5 min duration, not 6 min yet Goal status: IN progress   LONG TERM GOALS: Target date: 08/01/23  Improve LEFS >10 points indicating pt is less significantly limited in her ability to perform her daily activities Baseline: 40; 12/15: 30 Goal status: IN progress  2.  Pt will be able to amb on even/uneven surfaces x 15 min without AD to do errands/grocery shopping independently Baseline: SPC used for amb on flat surfaces, 12/15: pt using rolling walker now after recent hospitalization Goal status: IN progress  3.  Pt will be able to  perform >20 min consecutive standing housechores to complete her housework/meal prep/laundry without being limited by fatigue/SOB Baseline: 10 min intervals Goal status: IN progress   PLAN:  PT FREQUENCY: 2x/week  PT DURATION: 6 weeks  PLANNED INTERVENTIONS: 97110-Therapeutic exercises, 97530- Therapeutic activity, [id number]- Neuromuscular re-education, 97535- Self Care, [id number]- Manual therapy, and Patient/Family education.  PLAN FOR NEXT SESSION: continue with tx emphasizing endurance/conditioning, LE strengthening, stair intervals, walking program, 6 min walk test attempt  Vernell Reges, PT, DPT, OCS  Izacc Demeyer E Shalayah Beagley, PT 06/26/2024, 10:55 AM  "

## 2024-06-28 ENCOUNTER — Ambulatory Visit

## 2024-07-03 ENCOUNTER — Ambulatory Visit

## 2024-07-05 ENCOUNTER — Ambulatory Visit

## 2024-07-07 ENCOUNTER — Ambulatory Visit: Attending: Internal Medicine

## 2024-07-07 DIAGNOSIS — R296 Repeated falls: Secondary | ICD-10-CM | POA: Insufficient documentation

## 2024-07-07 DIAGNOSIS — R262 Difficulty in walking, not elsewhere classified: Secondary | ICD-10-CM | POA: Diagnosis present

## 2024-07-07 DIAGNOSIS — R5381 Other malaise: Secondary | ICD-10-CM | POA: Diagnosis present

## 2024-07-07 DIAGNOSIS — M6281 Muscle weakness (generalized): Secondary | ICD-10-CM | POA: Insufficient documentation

## 2024-07-07 NOTE — Progress Notes (Signed)
 [facility] Medicine  CHRONIC CARE MANAGEMENT OUTREACH ENCOUNTER         Date of Service:  07/07/2024     Service:  Care Coordination - phone Is there someone else in the room? Yes. What is your relationship? Mother and sister. Do you want this person here for the visit? yes. Are you located in [place]? Yes MyChart use by patient is active: yes  Post-outreach Action Items: Provider: No/none. CM: No/none. Patient: No/none.  Follow-up Next Call: January CCM call  Chronic Care Management (CCM) Outreach Purpose of outreach: Asthma and Chronic Pain  Care Manager (CM) completed the following: Reviewed chart External medical records, current nursing notes, and  previous provider notes have been read, reviewed and appreciated if available and when applicable to today's CCM call.  Chart review included allergies, current medications, and problem list Pt is going to Dr Pamelia once a week and her counselor at [employer]  once a week (on different days) per SW note on 06/19/2024 CM Nurse notes Pt canceled upcoming PCP visit on 07/11/2024 Pt also canceled all future appointments scheduled thru 08/10/2024  Pt did keep scheduled pulmonary visit on 07/26/2024 Provider noted in 07/05/2024 visit note keep appointment with Dr. Jesus next week CM Nurse placing call to better understand reason Pt canceled future visits, and to assist in eliminating any transportation barriers  Updates on ongoing concerns/symptoms: Pt answers call and states, I'm better today than I was yesterday. Pt becomes tearful when discussing why she canceled her upcoming visit with PCP on 07/11/2024 but was able to continue the conversation Pt states, I'm just tired of doctors right now. Pt goes on to share her frustrations with not knowing what is causing her GI symptoms Pt states, I'm keeping Dr. Sheppard because he helps my breathing.  CM Nurse asked Pt who was home with her  Pt states her mother and sister are both home  with her CM Nurse asked Pt if she feels safe right now Pt reports feeling safe and places mother on call to confirm she is safe Pt's mother gets on call and states the patient is doing fine CM Nurse kindly asked Pt's mother to place Pt back on call CM Nurse heard Pt state, No in the background Pt's mother reports Pt is now in the bathroom and is unavailable to talk CM Nurse ended call per Pt's and family's request CM Nurse consulted in clinic with SW who advises no further action is needed as Pt did not make any threats during call and has the right to cancel her appointments CM Nurse met with PCP in clinic to provide the above update  PCP does not recommend taking any further action at this time Pt was provided resources by SW for suicide/Crisis Hotline on 06/19/2025    Health Maintenance:  Health Maintenance Due  Topic Date Due   COVID-19 Vaccine (8 - 2025-26 season) 03/06/2024   CCM Care Plan Goal Review:  Call 988 for the crisis hotline  Take all medications as directed  Call 911 for medical emergencies   Wrap-up Reviewed upcoming clinic appointment(s): Yes. Plans to keep appointment(s): No. Transportation assistance needed: No. Patient was encouraged to reach out to their provider with any questions or concerns  Future Appointments  Date Time Provider Department Center  07/26/2024  8:00 AM Lesleigh Sheppard FALCON, MD [facility]  11/21/2024  8:00 AM Yoxheimer, Manuelita ONEIDA BODILY [facility]  02/06/2025 10:40 AM Mendsen, Ozell Ned, OD [facility]    A copy of this Patient Outreach/CCM Encounter was sent to patient's Primary Care Provider   CCM Documentation Time spent in direct care with patient and/or health care proxy via non-in-person encounter(s): 4 minutes  Time spent in indirect patient care and coordination: 48 minutes

## 2024-07-07 NOTE — Therapy (Signed)
 " OUTPATIENT PHYSICAL THERAPY THORACOLUMBAR Treatment/re-cert through 07/31/24   Patient Name: Brandi Bell MRN: [id number] DOB:[birth year], 64 y.o., female Today's Date: 07/07/2024  END OF SESSION:  PT End of Session - 07/07/24 1012     Visit Number 15    Number of Visits 24    Date for Recertification  07/31/24    Authorization Type request 2x/week x 6 weeks (Medicare PN needed at visit #20)    PT Start Time 0800    PT Stop Time 0845    PT Time Calculation (min) 45 min    Activity Tolerance Patient tolerated treatment well    Behavior During Therapy WFL for tasks assessed/performed            Past Medical History:  Diagnosis Date   Anemia    hx of   Anxiety    Arthritis    osteo Back, Feet, Hands   Asthma    [facility] admission - on Bipap - 7/13 - report on paper chart/ flare 3 mos ago   Bipolar disorder (HCC)    Bipolar disorder (HCC)    Bulging lumbar disc    Cancer (HCC)    cervical   Cataract    Depression    Elevated LFTs    having ultrasound [facility] - 12/14/14   GERD (gastroesophageal reflux disease)    vomiting   Headache    migraines, every three or 4 days/ stress related   Heart attack (HCC)    hx of/ Dr. 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, self repaired   History of kidney stones    History of migraine headaches    Hypertension    Echo -1/16 - report on paper chart/controlled on meds   IBS (irritable bowel syndrome)    Microhematuria    Motion sickness    Osteoporosis    Pneumonia    hx of   PONV (postoperative nausea and vomiting)    PTSD (post-traumatic stress disorder)    Restless leg syndrome    Rhinitis    Shortness of breath dyspnea    with exertion   Sleep apnea    central and obstructive/ CPAP   Spinal stenosis of cervical region    Suicide attempt [facility])    Vertigo 6 yrs ago   hx of   Past Surgical History:  Procedure Laterality Date   ANTERIOR AND POSTERIOR REPAIR  2002   done with bladder  tact   BILATERAL SALPINGOOPHORECTOMY  2004   pain from scar tissue   CARDIAC CATHETERIZATION  2008   [facility] - neg - report in paper chart   CATARACT EXTRACTION Bilateral 2007   CHOLECYSTECTOMY     COLONOSCOPY N/A 01/04/2015   Procedure: COLONOSCOPY;  Surgeon: Rogelia Copping, MD;  Location: [facility];  Service: Gastroenterology;  Laterality: N/A;  with biopsies   CYSTOSCOPY W/ RETROGRADES Right 10/26/2016   Procedure: CYSTOSCOPY WITH RETROGRADE PYELOGRAM;  Surgeon: Rosina Riis, MD;  Location: [facility];  Service: Urology;  Laterality: Right;   CYSTOSCOPY WITH STENT PLACEMENT Left 10/26/2016   Procedure: CYSTOSCOPY WITH STENT PLACEMENT;  Surgeon: Rosina Riis, MD;  Location: [facility];  Service: Urology;  Laterality: Left;   ESOPHAGOGASTRODUODENOSCOPY (EGD) WITH PROPOFOL  N/A 12/30/2015   Procedure: ESOPHAGOGASTRODUODENOSCOPY (EGD) WITH PROPOFOL ;  Surgeon: Rogelia Copping, MD;  Location: [facility];  Service: Endoscopy;  Laterality: N/A;   ESOPHAGOGASTRODUODENOSCOPY (EGD) WITH PROPOFOL  N/A 08/12/2017   Procedure: ESOPHAGOGASTRODUODENOSCOPY (EGD) WITH PROPOFOL ;  Surgeon: Jinny Carmine, MD;  Location: [facility];  Service: Endoscopy;  Laterality: N/A;  CPAP   FOOT SURGERY     multiple   SEPTOPLASTY     SHOULDER ARTHROSCOPY Bilateral 2008   TONSILLECTOMY  6   TUBAL LIGATION     URETEROSCOPY WITH HOLMIUM LASER LITHOTRIPSY Left 10/26/2016   Procedure: URETEROSCOPY WITH HOLMIUM LASER LITHOTRIPSY;  Surgeon: Rosina Riis, MD;  Location: [facility];  Service: Urology;  Laterality: Left;   VAGINAL HYSTERECTOMY  1989   cervical cancer age 83   Patient Active Problem List   Diagnosis Date Noted   Nausea and vomiting    OSA (obstructive sleep apnea) 02/08/2017   Vitamin D  deficiency 07/21/2016   Hx of suicide attempt 07/19/2016   PTSD (post-traumatic stress disorder) 07/19/2016   Migraine headache 07/19/2016   RLS (restless legs syndrome) 07/01/2016   Osteoporosis 07/01/2016    Trochanteric bursitis 03/17/2016   Prediabetes 03/17/2016   Gastritis    Pituitary adenoma (HCC) 10/02/2015   Borderline personality disorder (HCC) 02/10/2015   Chronic pain syndrome 02/10/2015   Arthritis 02/10/2015   Hx of colonic polyps    Seasonal allergies 12/21/2013   Fibromyalgia 12/21/2013   Moderate persistent asthma without complication 06/20/2013   Chronic nausea 06/20/2013   Bipolar affective disorder (HCC) 06/20/2013   Hx of cervical cancer 07/20/2012   Lumbar herniated disc 07/20/2012   Hyperlipidemia 07/20/2012   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 Dann, MD  REFERRING PROVIDER: Dr. Jesus  REFERRING DIAG:  Diagnosis  M54.31 (ICD-10-CM) - Sciatica, right side  M50.30 (ICD-10-CM) - Other cervical disc degeneration, unspecified cervical region  Z91.81 (ICD-10-CM) - History of falling    Rationale for Evaluation and Treatment: Rehabilitation  THERAPY DIAG:  Muscle weakness (generalized)  Physical deconditioning  Repeated falls  Difficulty in walking, not elsewhere classified  ONSET DATE: August 2025  SUBJECTIVE:                                                                                                                                                                                           SUBJECTIVE STATEMENT: Pt's main concern is she feels weak after multiple health concerns this year and multiple hospital stays (most recent in Aug 2025 for respiratory issues; per char review- recently returned to the hospital due to acute hypoxemic respiratory failure with worsening dyspnea and asthma exacerbation. )  Just had a course of home health PT- finished before outpatient PT   Started medications for breathing- those are helping; starts injection therapy this Friday (Dupixent)  Walking with a SPC, sometimes walker, and sometimes  no cane.   Most recently had a fall 2 weeks ago- physical fatigue she feels like  contributes to it  Difficulty standing stationary (bothers lower back)  Does some walking 100 m driveway, 4x wears a mask for this, feels SOB sometimes    PERTINENT HISTORY:  Just finished home health PT Was hospitalized for respiratory failure in August 2025 Has h/o low back pain- L3-4  Has had shoulder surgery b/l  PAIN:  Are you having pain? Currently 0/10 and at worst 9-10/10  PRECAUTIONS: Fall  RED FLAGS: None   WEIGHT BEARING RESTRICTIONS: No  FALLS:  Has patient fallen in last 6 months? Yes. Number of falls >3  LIVING ENVIRONMENT: Lives with: lives alone Lives in: Mobile home Stairs: 6 steps to enter/exit Has following equipment at home: Single point cane  Has family/sister support nearby where she lives   OCCUPATION: retired; enjoys environmental manager, does puzzles  PLOF: Independent  PATIENT GOALS: to be able to walk, able to hike, able to walk [place] (beach trip planned for October 2025), able to travel again  NEXT MD VISIT: End of September   OBJECTIVE:  Note: Objective measures were completed at Evaluation unless otherwise noted.  DIAGNOSTIC FINDINGS: See chart review  PATIENT SURVEYS:  LEFS 40/80  COGNITION: Overall cognitive status: Within functional limits for tasks assessed     SENSATION: WFL   POSTURE: rounded shoulders   LUMBAR ROM: deferred  AROM eval  Flexion   Extension   Right lateral flexion   Left lateral flexion   Right rotation   Left rotation    (Blank rows = not tested)  LOWER EXTREMITY ROM:   deferred; pt able to transfer sit to stand  Active  Right eval Left eval  Hip flexion    Hip extension    Hip abduction    Hip adduction    Hip internal rotation    Hip external rotation    Knee flexion    Knee extension    Ankle dorsiflexion    Ankle plantarflexion    Ankle inversion    Ankle eversion     (Blank rows = not tested)  LOWER EXTREMITY MMT:    MMT Right eval Left eval  Hip flexion 4 4   Hip extension    Hip abduction    Hip adduction    Hip internal rotation    Hip external rotation    Knee flexion 4 4  Knee extension 4 4  Ankle dorsiflexion 4 4  Ankle plantarflexion    Ankle inversion    Ankle eversion     (Blank rows = not tested)   FUNCTIONAL TESTS:  5 times sit to stand: 9 seconds today   SLS: 3-5 seconds each LE   TUG 11 seconds today GAIT: Pt amb with SPC in clinic today  TREATMENT DATE: 07/07/24 Subjective:  Pt states she was sick last week; this wiped her out.  Her goal for this month is to be able to stand/walk longer and to get stronger.  Her R sciatica is bothering her.  L arm is having some parasthesias/tingling too and she would like to learn some movements to help take care of this so she can use her cane/walker without it bothering her L arm.  Objective: Cervical spine screen: +L ULTT for reproduction of sx + limited cervical AROM rotation R <L , rotation R reproduces her typical L sx Decreased sensation L C7/8 dermatome + MTP L cervical paraspinal/suboccipital mm + relief  of sx with manual distraction  Manual therapy:  Cervical distraction x 10 second holds x 10 MTP STM L suboccipitals Manual L nerve glide (ULTT #1 slider) x 10, 3 sets  Therapeutic Exercise: Seated cervical retraction x 10, 2 sets Scapular retraction 10, 2 sets Self nerve glide (Tray) slider x10, 2 sets  Therapeutic Activities: Sit to stand 5 reps, 3 sets Amb with PT in hallway using SPC x 3 laps Updated HEP- see medbridge   Not today Therapeutic Exercise: modified today Nustep level 3 x 10 minutes, seat #7- not today SKTC x 5 with PT ea- not today Hooklying LTR x 5 ea- not today SLR with PT assist x 10 ea- not today Seated marches x 10 ea- not today LAQ: 2x10 ea no weight today Hip abd with blue TB: 5 second holds x 20 Hip add with ball (sitting): 5 second holds x 20 Bridges: 2x5 partial ROM- not today Seated lumbar flexion x5, 2 sets- not today  Gentle  AROM within available ROM for R shoulder flexion x 10 and abd x10 and ER/IR x10; elbow flex/extension x10, cervical rotation R/L x10 Scapular retraction x 10, 2 sets Diaphragmatic breathing x10 Hip abd with PT 5 seconds x 20 Hip add with PT 5 seconds x 20 Clothes pin (yellow) grip exercises- index+middle and thumb grip 30 second intervals  Neuro re-ed: Tandem stance on airex:4x 20 sec- ea direction Feet together on airex: 1 min with intervals of taking hands off railing  Therapeutic Activities:  Squats with chair behind: 2x5, x5, x8 (25 reps total during session) Amb in hallway: with rollator (500 ft) Sit to stand x 5 Standing side stepping in parallel bars 5 laps, over hurdles- no hrudles today Tandem walk x 3 laps- not today Front step ups: x10 R x10 L (6 inch)- not today Heel raises 2x10, toe raises 2x10 Standing hip abd 2x10 ea- not today Standing feet together with reaching R/L for cone outside BOS with PT above/at/below shoulder height (PT supervision close with gait belt) Push/pulling with dowel: 2x10, 2 sets with PT manual resistance- not today  Not today 6 min walk test: able to complete 3:45 minutes (8x50ft total=560 ft) then needed a sitting break due to R hip mm fatigue; test ended- not today   PATIENT EDUCATION:  Education details: HEP, PT POC/goals Person educated: Patient Education method: Explanation Education comprehension: verbalized understanding  HOME EXERCISE PROGRAM: Access Code: [email or web address] URL: [email or web address] Date: 07/07/2024 Prepared by: Vernell Reges  Exercises - Supine Sciatic Nerve Glide  - 2 x daily - 7 x weekly - 2 sets - 10 reps - Standing Lumbar Extension with Counter  - 2 x daily - 7 x weekly - 3 sets - 5 reps - Squat with Chair Touch  - 1 x daily - 7 x weekly - 2 sets - 10 reps - Forward Step Up with Counter Support  - 1 x daily - 7 x weekly - 2 sets - 10 reps - Seated Cervical Retraction  - 3 x daily - 7 x weekly - 2  sets - 10 reps - Median Nerve Flossing - Tray  - 3 x daily - 7 x weekly - 2 sets - 10 reps - Seated Scapular Retraction  - 3 x daily - 7 x weekly - 2 sets - 10 reps  ASSESSMENT:  CLINICAL IMPRESSION: Patient arrived to PT using her [facility] today because she is having difficulty lifting her rollator out/into her car.  When she uses the rollator  her arms feel better; but today presents with L cervical radiculitis.  Discussed how to integrate therapeutic exercises for nerve glide/postural mm retraining into her HEP this week.  Improving her overall lower and upper body strength should help her be able to maneuver her rollator more independently; and should help improve her ability to walk for longer distances.  Still working towards completing a 6 min walk test.  Overall, she would benefit from continued skilled PT interventions to improve strength, activity tolerance, and improve her safety/reduce fall risks to facilitate improved QOL.  OBJECTIVE IMPAIRMENTS: decreased activity tolerance, decreased balance, difficulty walking, decreased strength, impaired perceived functional ability, and pain.   ACTIVITY LIMITATIONS: standing, squatting, stairs, transfers, and locomotion level  PARTICIPATION LIMITATIONS: meal prep, cleaning, laundry, shopping, community activity, and yard work  PERSONAL FACTORS: Past/current experiences, Time since onset of injury/illness/exacerbation, and 1-2 comorbidities: see PMH are also affecting patient's functional outcome.   REHAB POTENTIAL: Good  CLINICAL DECISION MAKING: Evolving/moderate complexity  EVALUATION COMPLEXITY: Moderate   GOALS: Goals reviewed with patient? Yes  SHORT TERM GOALS: Target date: 04/05/24  Complete 6 min walk test to establish baseline endurance for amb  Baseline:at visit #2; 06/19/24: 5 min duration, not 6 min yet Goal status: IN progress   LONG TERM GOALS: Target date: 08/01/23  Improve LEFS >10 points indicating pt is less  significantly limited in her ability to perform her daily activities Baseline: 40; 12/15: 30 Goal status: IN progress  2.  Pt will be able to amb on even/uneven surfaces x 15 min without AD to do errands/grocery shopping independently Baseline: SPC used for amb on flat surfaces, 12/15: pt using rolling walker now after recent hospitalization Goal status: IN progress  3.  Pt will be able to perform >20 min consecutive standing housechores to complete her housework/meal prep/laundry without being limited by fatigue/SOB Baseline: 10 min intervals Goal status: IN progress   PLAN:  PT FREQUENCY: 2x/week  PT DURATION: 6 weeks  PLANNED INTERVENTIONS: 97110-Therapeutic exercises, 97530- Therapeutic activity, [id number]- Neuromuscular re-education, 97535- Self Care, [id number]- Manual therapy, and Patient/Family education.  PLAN FOR NEXT SESSION: continue with tx emphasizing endurance/conditioning, LE strengthening, stair intervals, walking program, 6 min walk test attempt  Vernell Reges, PT, DPT, OCS  Brandi Bell, PT 07/07/2024, 10:12 AM  "

## 2024-07-10 ENCOUNTER — Ambulatory Visit

## 2024-07-10 DIAGNOSIS — R296 Repeated falls: Secondary | ICD-10-CM

## 2024-07-10 DIAGNOSIS — R5381 Other malaise: Secondary | ICD-10-CM

## 2024-07-10 DIAGNOSIS — M6281 Muscle weakness (generalized): Secondary | ICD-10-CM

## 2024-07-10 DIAGNOSIS — R262 Difficulty in walking, not elsewhere classified: Secondary | ICD-10-CM

## 2024-07-10 NOTE — Progress Notes (Signed)
 I was the supervising physician in the delivery of the service. Garen Cone, MD

## 2024-07-10 NOTE — Therapy (Signed)
 " OUTPATIENT PHYSICAL THERAPY THORACOLUMBAR Treatment/re-cert through 07/31/24   Patient Name: Brandi Bell MRN: [id number] DOB:[birth year], 64 y.o., female Today's Date: 07/10/2024  END OF SESSION:  PT End of Session - 07/10/24 1545     Visit Number 16    Number of Visits 24    Date for Recertification  07/31/24    Authorization Type request 2x/week x 6 weeks (Medicare PN needed at visit #20)    PT Start Time 1545    PT Stop Time 1630    PT Time Calculation (min) 45 min    Activity Tolerance Patient tolerated treatment well    Behavior During Therapy WFL for tasks assessed/performed             Past Medical History:  Diagnosis Date   Anemia    hx of   Anxiety    Arthritis    osteo Back, Feet, Hands   Asthma    [facility] admission - on Bipap - 7/13 - report on paper chart/ flare 3 mos ago   Bipolar disorder (HCC)    Bipolar disorder (HCC)    Bulging lumbar disc    Cancer (HCC)    cervical   Cataract    Depression    Elevated LFTs    having ultrasound [facility] - 12/14/14   GERD (gastroesophageal reflux disease)    vomiting   Headache    migraines, every three or 4 days/ stress related   Heart attack (HCC)    hx of/ Dr. 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, self repaired   History of kidney stones    History of migraine headaches    Hypertension    Echo -1/16 - report on paper chart/controlled on meds   IBS (irritable bowel syndrome)    Microhematuria    Motion sickness    Osteoporosis    Pneumonia    hx of   PONV (postoperative nausea and vomiting)    PTSD (post-traumatic stress disorder)    Restless leg syndrome    Rhinitis    Shortness of breath dyspnea    with exertion   Sleep apnea    central and obstructive/ CPAP   Spinal stenosis of cervical region    Suicide attempt [facility])    Vertigo 6 yrs ago   hx of   Past Surgical History:  Procedure Laterality Date   ANTERIOR AND POSTERIOR REPAIR  2002   done with bladder  tact   BILATERAL SALPINGOOPHORECTOMY  2004   pain from scar tissue   CARDIAC CATHETERIZATION  2008   [facility] - neg - report in paper chart   CATARACT EXTRACTION Bilateral 2007   CHOLECYSTECTOMY     COLONOSCOPY N/A 01/04/2015   Procedure: COLONOSCOPY;  Surgeon: Rogelia Copping, MD;  Location: [facility];  Service: Gastroenterology;  Laterality: N/A;  with biopsies   CYSTOSCOPY W/ RETROGRADES Right 10/26/2016   Procedure: CYSTOSCOPY WITH RETROGRADE PYELOGRAM;  Surgeon: Rosina Riis, MD;  Location: [facility];  Service: Urology;  Laterality: Right;   CYSTOSCOPY WITH STENT PLACEMENT Left 10/26/2016   Procedure: CYSTOSCOPY WITH STENT PLACEMENT;  Surgeon: Rosina Riis, MD;  Location: [facility];  Service: Urology;  Laterality: Left;   ESOPHAGOGASTRODUODENOSCOPY (EGD) WITH PROPOFOL  N/A 12/30/2015   Procedure: ESOPHAGOGASTRODUODENOSCOPY (EGD) WITH PROPOFOL ;  Surgeon: Rogelia Copping, MD;  Location: [facility];  Service: Endoscopy;  Laterality: N/A;   ESOPHAGOGASTRODUODENOSCOPY (EGD) WITH PROPOFOL  N/A 08/12/2017   Procedure: ESOPHAGOGASTRODUODENOSCOPY (EGD) WITH PROPOFOL ;  Surgeon: Jinny Carmine, MD;  Location: [facility];  Service: Endoscopy;  Laterality: N/A;  CPAP   FOOT SURGERY     multiple   SEPTOPLASTY     SHOULDER ARTHROSCOPY Bilateral 2008   TONSILLECTOMY  6   TUBAL LIGATION     URETEROSCOPY WITH HOLMIUM LASER LITHOTRIPSY Left 10/26/2016   Procedure: URETEROSCOPY WITH HOLMIUM LASER LITHOTRIPSY;  Surgeon: Rosina Riis, MD;  Location: [facility];  Service: Urology;  Laterality: Left;   VAGINAL HYSTERECTOMY  1989   cervical cancer age 80   Patient Active Problem List   Diagnosis Date Noted   Nausea and vomiting    OSA (obstructive sleep apnea) 02/08/2017   Vitamin D  deficiency 07/21/2016   Hx of suicide attempt 07/19/2016   PTSD (post-traumatic stress disorder) 07/19/2016   Migraine headache 07/19/2016   RLS (restless legs syndrome) 07/01/2016   Osteoporosis 07/01/2016    Trochanteric bursitis 03/17/2016   Prediabetes 03/17/2016   Gastritis    Pituitary adenoma (HCC) 10/02/2015   Borderline personality disorder (HCC) 02/10/2015   Chronic pain syndrome 02/10/2015   Arthritis 02/10/2015   Hx of colonic polyps    Seasonal allergies 12/21/2013   Fibromyalgia 12/21/2013   Moderate persistent asthma without complication 06/20/2013   Chronic nausea 06/20/2013   Bipolar affective disorder (HCC) 06/20/2013   Hx of cervical cancer 07/20/2012   Lumbar herniated disc 07/20/2012   Hyperlipidemia 07/20/2012   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 Dann, MD  REFERRING PROVIDER: Dr. Jesus  REFERRING DIAG:  Diagnosis  M54.31 (ICD-10-CM) - Sciatica, right side  M50.30 (ICD-10-CM) - Other cervical disc degeneration, unspecified cervical region  Z91.81 (ICD-10-CM) - History of falling    Rationale for Evaluation and Treatment: Rehabilitation  THERAPY DIAG:  Muscle weakness (generalized)  Physical deconditioning  Repeated falls  Difficulty in walking, not elsewhere classified  ONSET DATE: August 2025  SUBJECTIVE:                                                                                                                                                                                           SUBJECTIVE STATEMENT: Pt's main concern is she feels weak after multiple health concerns this year and multiple hospital stays (most recent in Aug 2025 for respiratory issues; per char review- recently returned to the hospital due to acute hypoxemic respiratory failure with worsening dyspnea and asthma exacerbation. )  Just had a course of home health PT- finished before outpatient PT   Started medications for breathing- those are helping; starts injection therapy this Friday (Dupixent)  Walking with a SPC, sometimes walker, and sometimes  no cane.   Most recently had a fall 2 weeks ago- physical fatigue she feels like  contributes to it  Difficulty standing stationary (bothers lower back)  Does some walking 100 m driveway, 4x wears a mask for this, feels SOB sometimes    PERTINENT HISTORY:  Just finished home health PT Was hospitalized for respiratory failure in August 2025 Has h/o low back pain- L3-4  Has had shoulder surgery b/l  PAIN:  Are you having pain? Currently 0/10 and at worst 9-10/10  PRECAUTIONS: Fall  RED FLAGS: None   WEIGHT BEARING RESTRICTIONS: No  FALLS:  Has patient fallen in last 6 months? Yes. Number of falls >3  LIVING ENVIRONMENT: Lives with: lives alone Lives in: Mobile home Stairs: 6 steps to enter/exit Has following equipment at home: Single point cane  Has family/sister support nearby where she lives   OCCUPATION: retired; enjoys environmental manager, does puzzles  PLOF: Independent  PATIENT GOALS: to be able to walk, able to hike, able to walk [place] (beach trip planned for October 2025), able to travel again  NEXT MD VISIT: End of September   OBJECTIVE:  Note: Objective measures were completed at Evaluation unless otherwise noted.  DIAGNOSTIC FINDINGS: See chart review  PATIENT SURVEYS:  LEFS 40/80  COGNITION: Overall cognitive status: Within functional limits for tasks assessed     SENSATION: WFL   POSTURE: rounded shoulders   LUMBAR ROM: deferred  AROM eval  Flexion   Extension   Right lateral flexion   Left lateral flexion   Right rotation   Left rotation    (Blank rows = not tested)  LOWER EXTREMITY ROM:   deferred; pt able to transfer sit to stand  Active  Right eval Left eval  Hip flexion    Hip extension    Hip abduction    Hip adduction    Hip internal rotation    Hip external rotation    Knee flexion    Knee extension    Ankle dorsiflexion    Ankle plantarflexion    Ankle inversion    Ankle eversion     (Blank rows = not tested)  LOWER EXTREMITY MMT:    MMT Right eval Left eval  Hip flexion 4 4   Hip extension    Hip abduction    Hip adduction    Hip internal rotation    Hip external rotation    Knee flexion 4 4  Knee extension 4 4  Ankle dorsiflexion 4 4  Ankle plantarflexion    Ankle inversion    Ankle eversion     (Blank rows = not tested)   FUNCTIONAL TESTS:  5 times sit to stand: 9 seconds today   SLS: 3-5 seconds each LE   TUG 11 seconds today GAIT: Pt amb with SPC in clinic today  TREATMENT DATE: 07/10/24 Subjective:  Pt states her neck and L UE are feeling much better.  No tingling in L UE.  She has been drinking ensure for her 3 meals/day as regular meals is upsetting her stomach.  Continues to note financial strain, food insecurity, housing insecurity, and continues to meet with her psychiatrist regularly.  She is staying with family for now.  Would like to continue building standing/walking tolerance and improve safety/reduce fall risk.    Vitals: SpO2 96%, HR 99 at arrival and start of nustep, then rechecked at 8 minutes SpO2 97%, HR 97 BPM  Objective:  Therapeutic Exercise: Nustep level 2 x 10 minutes  today Seated cervical retraction x 10, 2 sets Scapular retraction 10, 2 sets (1 set with ER/W's) Self nerve glide (Tray) slider x10, 2 sets Seated knee extension: 4# 20x; 2 sets  Therapeutic Activities: Sit to stand 10 reps, 2 sets Lateral stepping in parallel bars with blue band at thighs: 4 laps Standing marches: 2x20 Standing hamstring curl: 2x20 Heel raises: 2x20 6 min walk test attempt at end of session: 4:09 seconds today using rollator then had to discontinue due to fatigue  At end of session- SpO2 96%, 97 HR  PATIENT EDUCATION:  Education details: HEP, PT POC/goals Person educated: Patient Education method: Explanation Education comprehension: verbalized understanding  HOME EXERCISE PROGRAM: Access Code: [email or web address] URL: [email or web address] Date: 07/07/2024 Prepared by: Vernell Reges  Exercises - Supine Sciatic Nerve  Glide  - 2 x daily - 7 x weekly - 2 sets - 10 reps - Standing Lumbar Extension with Counter  - 2 x daily - 7 x weekly - 3 sets - 5 reps - Squat with Chair Touch  - 1 x daily - 7 x weekly - 2 sets - 10 reps - Forward Step Up with Counter Support  - 1 x daily - 7 x weekly - 2 sets - 10 reps - Seated Cervical Retraction  - 3 x daily - 7 x weekly - 2 sets - 10 reps - Median Nerve Flossing - Tray  - 3 x daily - 7 x weekly - 2 sets - 10 reps - Seated Scapular Retraction  - 3 x daily - 7 x weekly - 2 sets - 10 reps  ASSESSMENT:  CLINICAL IMPRESSION: Patient arrived to PT using her rollator today.  Verbal/tactile cues for exercise pacing/technique implemented today as pt tends to move too quickly through exercises.  Two brief sitting rest breaks needed during session.  Continuing to work on overall activity tolerance/building endurance during PT session so intentionally attempted 6 min walk test towards end of session today.  Pt able to complete 4:09 before needing a sitting rest break due to LE fatigue.  Multiple yellow flags still influencing her prognosis.  Overall, she would benefit from continued skilled PT interventions to improve strength, activity tolerance, and improve her safety/reduce fall risks to facilitate improved QOL.  OBJECTIVE IMPAIRMENTS: decreased activity tolerance, decreased balance, difficulty walking, decreased strength, impaired perceived functional ability, and pain.   ACTIVITY LIMITATIONS: standing, squatting, stairs, transfers, and locomotion level  PARTICIPATION LIMITATIONS: meal prep, cleaning, laundry, shopping, community activity, and yard work  PERSONAL FACTORS: Past/current experiences, Time since onset of injury/illness/exacerbation, and 1-2 comorbidities: see PMH are also affecting patient's functional outcome.   REHAB POTENTIAL: Good  CLINICAL DECISION MAKING: Evolving/moderate complexity  EVALUATION COMPLEXITY: Moderate   GOALS: Goals reviewed with patient?  Yes  SHORT TERM GOALS: Target date: 04/05/24  Complete 6 min walk test to establish baseline endurance for amb  Baseline:at visit #2; 06/19/24: 5 min duration, not 6 min yet Goal status: IN progress   LONG TERM GOALS: Target date: 08/01/23  Improve LEFS >10 points indicating pt is less significantly limited in her ability to perform her daily activities Baseline: 40; 12/15: 30 Goal status: IN progress  2.  Pt will be able to amb on even/uneven surfaces x 15 min without AD to do errands/grocery shopping independently Baseline: SPC used for amb on flat surfaces, 12/15: pt using rolling walker now after recent hospitalization Goal status: IN progress  3.  Pt will be able to perform >20 min consecutive standing housechores to  complete her housework/meal prep/laundry without being limited by fatigue/SOB Baseline: 10 min intervals Goal status: IN progress   PLAN:  PT FREQUENCY: 2x/week  PT DURATION: 6 weeks  PLANNED INTERVENTIONS: 97110-Therapeutic exercises, 97530- Therapeutic activity, [id number]- Neuromuscular re-education, 97535- Self Care, [id number]- Manual therapy, and Patient/Family education.  PLAN FOR NEXT SESSION: continue with tx emphasizing endurance/conditioning, LE strengthening, stair intervals, walking program, 6 min walk test attempt  Vernell Reges, PT, DPT, OCS  Ardean Melroy E Wetzel Meester, PT 07/10/2024, 3:46 PM  "

## 2024-07-11 NOTE — Progress Notes (Signed)
 Endocrinology Follow-up  Subjective:   She presents today for acute concerns.   She reports not being able to eat since around Thanksgiving time due to epigastric pain.  She has only been consuming 2 ensure shakes per day. Barely anything else, and she is concerned that she is malnourished.  She has stopped all medications except for her wellbutrin.  She is seeing her psychiatrist every week now. She denies SI at this time. She is distressed by her epigastric pain and feels it hasn't been evaluated completely.  She did have upper endoscopy over the summer at [facility] that showed dilation in the entire main bile duct and in the cystic duct. CT A/P in 06/2024 was mostly  unremarkable.  The following portions of the patient's history were reviewed and updated as appropriate: allergies, current medications, past family history, past medical history, past social history, past surgical history and problem list.  Current Outpatient Medications  Medication Sig Dispense Refill   albuterol  (PROVENTIL ) 2.5 mg/0.5 mL nebulizer solution albuterol  sulfate concentrate 2.5 mg/0.5 mL solution for nebulization     albuterol  MDI, PROVENTIL , VENTOLIN , PROAIR , HFA (VENTOLIN  HFA) 90 mcg/actuation inhaler every 6 (six) hours as needed     buPROPion (WELLBUTRIN SR) 200 MG SR tablet Take 200 mg by mouth once daily     calcium citrate-vitamin D3 (CITRACAL+D) 315 mg-5 mcg (200 unit) tablet Take 2 tablets by mouth 2 (two) times daily with meals 360 tablet 3   cetirizine (ZYRTEC) 10 MG tablet Take 10 mg by mouth once daily     cholecalciferol (VITAMIN D3) 2,000 unit capsule Take 2,000 Units by mouth once daily     DUPILUMAB SUBQ Inject 300 mg subcutaneously every 14 (fourteen) days     esomeprazole  (NEXIUM ) 40 MG DR capsule Take 40 mg by mouth once daily     mecobalam-folate-B6-B2-betain 1,000 mcg-3,400 mcg DFE-10 mg Cap Take by mouth Methylfolate 1000mg      oxyCODONE  (ROXICODONE ) 5 MG immediate release tablet  TAKE 1 TABLET BY MOUTH EVERY 8 HOURS AS NEEDED FOR SEVERE RLS. MAY TAKE AN ADDITIONAL 1 TABLET UP TO 2 TIMES PER WEEK     polyethylene glycol (MIRALAX) packet Take 17 g by mouth once daily Mix in 4-8ounces of fluid prior to taking.     pregabalin (LYRICA ORAL) Take 200 mg by mouth 2 (two) times daily     rosuvastatin (CRESTOR) 10 MG tablet Take 10 mg by mouth once daily     amLODIPine (NORVASC) 2.5 MG tablet Take 2.5 mg by mouth once daily     aspirin 81 MG EC tablet Take 81 mg by mouth once daily     DULoxetine (CYMBALTA) 20 MG DR capsule Take 40 mg by mouth once daily     fluticasone propion-salmeteroL (ADVAIR HFA) 115-21 mcg/actuation inhaler Inhale 2 inhalations into the lungs every 12 (twelve) hours     FUROsemide (LASIX) 20 MG tablet Take 20 mg by mouth once daily     hydrOXYzine pamoate (VISTARIL) 25 MG capsule Take 1 capsule by mouth every 4 (four) hours as needed     rOPINIRole (REQUIP) 2 MG immediate release tablet Take 2 mg by mouth every 12 (twelve) hours     traMADoL  (ULTRAM ) 50 mg tablet 1-2 tabs at bedtime for RLS (Patient taking differently: Take 200 mg by mouth at bedtime 1-2 tabs at bedtime for RLS) 60 tablet 1   No current facility-administered medications for this visit.   Allergies  Allergen Reactions   Ciprofloxacin Other (See  Comments)    Other Reaction: Mouth Swelling   Morphine Nausea And Vomiting    And vomiting   Nucynta [Tapentadol] Itching   Penicillin Hives   Sulfa (Sulfonamide Antibiotics) Hives   Compazine [Prochlorperazine Maleate] Rash   Haldol [Haloperidol] Rash   Adhesive Other (See Comments)    Reaction:  Clear tape tears skin.  Tegaderm and paper tape are ok.     Adhesive Tape-Silicones Rash    Reaction:  Clear tape tears skin.  Tegaderm and paper tape are ok.     Amoxicillin Hives   Oxybutynin  Unknown    Blurred vision     Pineapple Other (See Comments)    Other reaction(s): Other (See Comments) Reaction:  Mouth  blisters blisters   Sulfasalazine Hives   Patient Active Problem List  Diagnosis   Migraine headache   Osteopenia   Lumbar herniated disc   HSV-2 (herpes simplex virus 2) infection   BMI 40.0-44.9, adult (CMS-HCC)   Obstructive sleep apnea   History of cervical cancer   Borderline personality disorder (CMS/HHS-HCC)   Closed fracture of lateral malleolus   DDD (degenerative disc disease), cervical   Disorder of bone and articular cartilage   Enthesopathy of hip region   Family history of breast cancer   Hip pain   Hx of colonic polyps   Hx of suicide attempt   Inflammation of sacroiliac joint ()   Muscle weakness   Neuropathy   Pain in forearm   Plantar fasciitis   Depressive disorder   Obesity (BMI 30-39.9), unspecified   Low back pain   GERD (gastroesophageal reflux disease)   History of malignant neoplasm of cervix   HSV-2 infection   Lumbar herniated disc   Migraine   Obstructive sleep apnea syndrome   Osteopenia   Prediabetes   PTSD (post-traumatic stress disorder)   Vitamin D  deficiency   Age-related osteoporosis without current pathological fracture   Epigastric pain   Family History  Problem Relation Name Age of Onset   Melanoma Mother Merlynn        Melanoma x 2   Breast cancer Mother Merlynn [place] seeded to breast removal of 2 tumor no kemo   Arthritis Mother Merlynn        Osteoarthritis   Cancer Mother Merlynn        Cancer 5 times colon x2, breast,.eye removed, skin   Coronary Artery Disease (Blocked arteries around heart) Mother Merlynn        Sub-clavian blockage   Diabetes Mother Merlynn    High blood pressure (Hypertension) Mother Merlynn    Allergic rhinitis Mother Merlynn    Asthma Mother Merlynn    Osteoarthritis Mother Merlynn    Osteoporosis (Thinning of bones) Mother Merlynn    Skin cancer Mother Merlynn Butter, baselcel   Diabetes type II Mother Merlynn    Allergies Mother Merlynn     Anesthesia problems Mother Merlynn        Breathing issues   Heart disease Mother Merlynn Finn   Colon cancer Mother Merlynn        Adenoma   Stroke Mother Merlynn Alice   Mental illness Mother Merlynn    Heart disease Father Lamar 71       MI versus aneurysm, died 39   Myocardial Infarction (Heart attack) Father Lamar    Coronary  Artery Disease (Blocked arteries around heart) Father Lamar        Unknown but had MI at 25 and died   Hyperthyroidism Sister     High blood pressure (Hypertension) Sister     Hyperlipidemia (Elevated cholesterol) Sister     Irritable bowel syndrome Sister     Hyperlipidemia (Elevated cholesterol) Sister     High blood pressure (Hypertension) Sister     Anxiety Sister Joy    Arthritis Sister Joy        Rheumatoid   Chronic pain Sister Joy    Fibromyalgia Sister Joy    Thyroid  disease Sister Joy        Hoshimotos   Allergic rhinitis Sister Joy    Asthma Sister Joy    Hyperlipidemia (Elevated cholesterol) Sister Joy    Osteoarthritis Sister Joy    High blood pressure (Hypertension) Sister Joy    Allergies Sister Joy    Cancer Sister Joy        Breast cancer   Breast cancer Sister Joy        Cancer in ducts   Headaches Sister Linda        Ocular   High blood pressure (Hypertension) Sister Rock    Skin cancer Sister Rock Grieves skin cancer   Hyperlipidemia (Elevated cholesterol) Sister Rock    Allergies Sister Rock    Cancer Sister [facility] cell   Hearing loss Sister Rock        Age 39   Osteoporosis (Thinning of bones) Sister Rock    Hyperlipidemia (Elevated cholesterol) Brother John    Allergies Brother John    Cancer Brother John        Brain cancer   High blood pressure (Hypertension) Brother John    Skin cancer Brother John        Glioblastoma died at 39   Breast cancer Maternal Aunt Nellie    Colon cancer Maternal Aunt Nellie    Arthritis Maternal Aunt Nellie         Osteoarthritis   Cancer Maternal Aunt Nellie        Breast   High blood pressure (Hypertension) Maternal Aunt Nellie    Asthma Maternal Aunt Nellie    Colon polyps Maternal Aunt Nellie        Cancer   Depression Maternal Aunt Nellie    Osteoarthritis Maternal Aunt Nellie    Diabetes type II Maternal Aunt Nellie    Diabetes Maternal Aunt Nellie        Type II   Clotting disorder Maternal Aunt Nellie        Has transfusions   Stroke Maternal Aunt Nellie        In her 67s   Pulmonary embolism Maternal Aunt Landon Eans        early 60s, died suddenly, aortic aneurysm?   Coronary Artery Disease (Blocked arteries around heart) Maternal Aunt Landon Eans        Aneurysm died 08/07/2058   Colon cancer Maternal Aunt Dene Daring    Deep vein thrombosis (DVT or abnormal blood clot formation) Maternal Aunt Dene Daring    Anxiety Maternal Aunt Dene Daring    Bipolar disorder Maternal Aunt Dene Daring    Cancer Maternal Aunt Dene Daring        Colon   Depression Maternal Aunt Dene Daring    Alcohol abuse Maternal Aunt Dene Daring  Osteoarthritis Maternal Aunt Dene Daring    Asthma Maternal Aunt Dene Daring    Alcohol abuse Maternal Aunt Margaret    Cancer Maternal Aunt Margaret        Lung cancer   Lung cancer Maternal Uncle     Colon cancer Maternal Uncle     Cancer Maternal Uncle         Lung   Lung cancer Maternal Uncle     Cancer Maternal Uncle         Lung   Alcohol abuse Maternal Uncle Bob    Colon cancer Maternal Uncle Octaviano    Cancer Maternal Higinio Octaviano        Lung cancer   Alcohol abuse Maternal Uncle Red    Cancer Maternal Uncle Red        Throat cancer   Alcohol abuse Maternal Uncle Ollin    Cancer Maternal Uncle Ollin        Lung cancer   Colon cancer Paternal Aunt Gladis    Cancer Paternal Aunt Gladis        Pancreatic and stomach cancer   Alzheimer's disease Paternal Uncle Louie    Diabetes Maternal Grandmother Lizette    Asthma Maternal  Grandmother Keandria    Osteoarthritis Maternal Grandmother Kniyah    Osteoporosis (Thinning of bones) Maternal Grandmother Chanteria    Diabetes type II Maternal Grandmother Kashauna    Diabetes Maternal Grandfather Paul        Type I   Coronary Artery Disease (Blocked arteries around heart) Maternal Grandfather Paul        72 died   Stroke Paternal Grandmother Jearlene    Diabetes type II Paternal Grandfather Lynder    Ovarian cancer Paternal Apolinar Lynder    Allergic rhinitis Daughter Delon    Allergic rhinitis Daughter Damien    Prostate cancer Neg Hx     Uterine cancer Neg Hx     Social History   Socioeconomic History   Marital status: Divorced   Number of children: 3  Occupational History   Occupation: Disabled, paramedic  Tobacco Use   Smoking status: Never   Smokeless tobacco: Never  Vaping Use   Vaping status: Never Used  Substance and Sexual Activity   Alcohol use: Never   Drug use: Never   Sexual activity: Not Currently    Partners: Female    Birth control/protection: None  Other Topics Concern   Would you please tell us  about the people who live in your home, your pets, or anything else important to your social life? No  Social History Narrative   Married x 2, now divorced.  Three children, 6 grandkids.  Job: disabled due to bipolar.  Exercise: none, but will work on.  Wears seatbelt, sunscreen.  Dentist: yes.  Calcium in diet: yes.  Religious beliefs affecting health care: none.      Advanced Directives: none in place.  She has told kids what she would want.   Social Drivers of Corporate Investment Banker Strain: High Risk (06/09/2024)   Received from [facility]   Overall Financial Resource Strain (CARDIA)    How hard is it for you to pay for the very basics like food, housing, medical care, and heating?: Very hard  Food Insecurity: Food Insecurity Present (05/24/2024)   Received from [facility]   Hunger Vital Sign    Within the past  12 months, you worried that your food would run out before you got the  money to buy more.: Often true    Within the past 12 months, the food you bought just didn't last and you didn't have money to get more.: Often true  Transportation Needs: No Transportation Needs (06/09/2024)   Received from [facility] - Transportation    Lack of Transportation (Medical): No    Lack of Transportation (Non-Medical): No     ROS Review of Systems  All other systems reviewed and are negative.  As per HPI       Objective:     Vitals:   07/11/24 1137 07/11/24 1138  BP:  123/77  Pulse:  79  Resp: 18   Weight: 97.1 kg (214 lb 1.1 oz)   Height: 154.9 cm (5' 1)    Body mass index is 40.45 kg/m.  Physical Exam Vitals reviewed.  Constitutional:      Comments: Patient is tearful, in distress.  Eyes:     Conjunctiva/sclera: Conjunctivae normal.  Pulmonary:     Effort: Pulmonary effort is normal.  Neurological:     Mental Status: She is oriented to person, place, and time.  Psychiatric:     Comments: Mood is low and she is in emotional distress.       Lab Review LABS Previous labs were reviewed today.  HbA1c:  Lab Results  Component Value Date   HGBA1C 5.6 04/03/2024   TSH: Lab Results  Component Value Date   TSH 2.41 03/09/2024   Chemistry   Chemistry      Component Value Date/Time   NA 141 07/11/2024 1225   NA 141 04/03/2013 1514   K 4.2 07/11/2024 1225   K 4.6 04/03/2013 1514   CL 102 07/11/2024 1225   CL 104 04/03/2013 1514   CO2 28 07/11/2024 1225   BUN 10 07/11/2024 1225   CREATININE 0.9 07/11/2024 1225   CREATININE 0.7 04/03/2013 1514   GLUCOSE 101 07/11/2024 1225   GLUCOSE 94 04/03/2013 1514      Component Value Date/Time   CALCIUM 9.7 07/11/2024 1225   CALCIUM 9.6 04/03/2013 1514   ALKPHOS 93 07/11/2024 1225   ALKPHOS 90 09/21/2012 0954   AST 23 07/11/2024 1225   AST 23 09/21/2012 0954   ALT 19 07/11/2024 1225   ALT 28 09/21/2012  0954   TBILI 1.0 07/11/2024 1225   TBILI 0.8 09/21/2012 0954     EGD July 2025:  Impression:            - Normal esophagus.                       - Normal stomach.                       - Normal examined duodenum.                       - There was dilation in the entire main bile duct and                       in the cystic duct which measured up to 13 mm.                       - Evidence of a cholecystectomy.                       - There was no evidence  of significant pathology in                       the left lobe of the liver.                       - There was no sign of significant pathology in the                       ampulla.                       - There was no sign of significant pathology in the                       pancreatic body and pancreatic tail.                       - No specimens collected. Recommendation:        - Discharge patient to home (with escort).                       - Patient has a contact number available for                       emergencies. The signs and symptoms of potential                       delayed complications were discussed with the patient.                       Return to normal activities tomorrow. Written                       discharge instructions were provided to the patient.                       - Resume previous diet today.                       - Continue present medications.                       - I anticipate no further need for intervention.                       - Return to referring physician as previously                       scheduled.                                                                               Procedure Code(s):     --- Professional ---                       [phone], Esophagogastroduodenoscopy, flexible,  transoral; with endoscopic ultrasound examination                       limited to the esophagus, stomach or duodenum, and                       adjacent structures Diagnosis  Code(s):     --- Professional ---                       K83.8, Other specified diseases of biliary tract                       Z90.49, Acquired absence of other specified parts of                       digestive tract  CT A/P 06/28/2024 FINDINGS:   LOWER CHEST: Unremarkable.   LIVER: Normal liver contour. Geographic steatosis.   BILIARY: The gallbladder is surgically absent. No intrahepatic biliary ductal dilatation.   SPLEEN: Normal in size and contour. Splenic granulomatosis.   PANCREAS: Normal pancreatic contour without signs of inflammation or gross ductal dilatation.   ADRENAL GLANDS: Normal appearance of the adrenal glands.   KIDNEYS/URETERS: Smooth renal contours. Nonobstructing right lower pole punctate stone. Nonobstructing left lower pole 0.4 cm stone. No ureteral dilatation or collecting system distention.   BLADDER: Unremarkable.   REPRODUCTIVE ORGANS: Uterus is surgically absent.   GI TRACT: No findings of bowel obstruction or acute inflammation. Colon diverticulosis. Normal appendix.   PERITONEUM/RETROPERITONEUM AND MESENTERY: No free air. No ascites. No fluid collection.   VASCULATURE: Normal caliber aorta with minimal calcifications. Duplicated IVC   LYMPH NODES: No adenopathy.   BONES and SOFT TISSUES: Degenerative changes of the lower lumbar spine. L4 vertebral body intraosseous hemangioma. No focal soft tissue lesions.    Assessment:   1. Epigastric pain   2. At risk for malnutrition   3. Protein-calorie malnutrition, unspecified severity (HHS-HCC)    Today she presents with non-endocrine concerns. She is clearly in emotional distress, and I have encouraged her to seek emergent care if she develops SI or self-harm desires. She is not living alone and has family nearby. She reports seeing her psychiatrist weekly which is encouraging. We will check her blood work today to ensure her organ systems are still working well as she is off many of her medications  and eating much less. Encourage continuing ensure shakes, and seek emergent care if she becomes dehydrated or weak. Regarding her GI concerns, EGD and CT A/P are not showing acute issues, but encouraged her to follow-up with GI to make sure all bases are covered. Functional GI symptoms are also a possibility, but a comprehensive GI evaluation is warranted as further testing may be needed. Referred to GI at [facility], and if needs to be seen more urgently, she can contact [facility] or present to the ED.  Plan:      1. Epigastric pain -     Hepatic Function Panel (HFP); Future -     Lipase; Future -     Renal Function Panel (RFP); Future -     Ambulatory Referral to Gastroenterology  2. At risk for malnutrition -     Prealbumin; Future -     Magnesium ; Future -     Ambulatory Referral to Gastroenterology  3. Protein-calorie malnutrition, unspecified severity (HHS-HCC) -     Magnesium ; Future  Requested Prescriptions    No prescriptions requested or ordered in this encounter   No follow-ups on file.  I spent 10 minutes reviewing patient's test and records, 20 minutes evaluating the patient and 15 minutes charting after the visit for a total of 45 minutes.  There are no Patient Instructions on file for this visit.

## 2024-07-12 ENCOUNTER — Ambulatory Visit

## 2024-07-12 NOTE — Result Encounter Note (Signed)
 I am happy to see your lab work looks great! Your liver function is normal; pancreas enzyme normal; magnesium , electrolytes, and kidney function normal; and pre-albumin normal.  You are somehow still nourishing your body, keep that up as best you can.  Best wishes, Dr. Zaheer  Endocrinology Nurse Triage: [phone], option 2 Scheduling Hub: [phone]

## 2024-07-14 ENCOUNTER — Ambulatory Visit

## 2024-07-14 NOTE — ED Triage Notes (Signed)
 Pt here for ongoing mid-abdominal pain. Pt stating she has not been able to tolerate for the last few weeks and then fluids starting around 1900.

## 2024-07-17 ENCOUNTER — Ambulatory Visit

## 2024-07-19 ENCOUNTER — Ambulatory Visit

## 2024-07-21 ENCOUNTER — Ambulatory Visit

## 2024-07-24 ENCOUNTER — Ambulatory Visit

## 2024-07-26 ENCOUNTER — Ambulatory Visit

## 2024-07-26 DIAGNOSIS — R296 Repeated falls: Secondary | ICD-10-CM

## 2024-07-26 DIAGNOSIS — M6281 Muscle weakness (generalized): Secondary | ICD-10-CM | POA: Diagnosis not present

## 2024-07-26 DIAGNOSIS — R262 Difficulty in walking, not elsewhere classified: Secondary | ICD-10-CM

## 2024-07-26 DIAGNOSIS — R5381 Other malaise: Secondary | ICD-10-CM

## 2024-07-26 NOTE — Therapy (Signed)
 " OUTPATIENT PHYSICAL THERAPY THORACOLUMBAR Treatment/re-cert through 07/31/24   Patient Name: Brandi Bell MRN: [id number] DOB:10-21-1960, 64 y.o., female Today's Date: 07/26/2024  END OF SESSION:  PT End of Session - 07/26/24 1101     Visit Number 17    Number of Visits 24    Date for Recertification  07/31/24    Authorization Type request 2x/week x 6 weeks (Medicare PN needed at visit #20)    PT Start Time 1115    PT Stop Time 1200    PT Time Calculation (min) 45 min    Equipment Utilized During Treatment Gait belt    Activity Tolerance Patient tolerated treatment well    Behavior During Therapy WFL for tasks assessed/performed             Past Medical History:  Diagnosis Date   Anemia    hx of   Anxiety    Arthritis    osteo Back, Feet, Hands   Asthma    [facility] admission - on Bipap - 7/13 - report on paper chart/ flare 3 mos ago   Bipolar disorder (HCC)    Bipolar disorder (HCC)    Bulging lumbar disc    Cancer (HCC)    cervical   Cataract    Depression    Elevated LFTs    having ultrasound [facility] - 12/14/14   GERD (gastroesophageal reflux disease)    vomiting   Headache    migraines, every three or 4 days/ stress related   Heart attack (HCC)    hx of/ Dr. 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, self repaired   History of kidney stones    History of migraine headaches    Hypertension    Echo -1/16 - report on paper chart/controlled on meds   IBS (irritable bowel syndrome)    Microhematuria    Motion sickness    Osteoporosis    Pneumonia    hx of   PONV (postoperative nausea and vomiting)    PTSD (post-traumatic stress disorder)    Restless leg syndrome    Rhinitis    Shortness of breath dyspnea    with exertion   Sleep apnea    central and obstructive/ CPAP   Spinal stenosis of cervical region    Suicide attempt [facility])    Vertigo 6 yrs ago   hx of   Past Surgical History:  Procedure Laterality Date    ANTERIOR AND POSTERIOR REPAIR  2002   done with bladder tact   BILATERAL SALPINGOOPHORECTOMY  2004   pain from scar tissue   CARDIAC CATHETERIZATION  2008   [facility] - neg - report in paper chart   CATARACT EXTRACTION Bilateral 2007   CHOLECYSTECTOMY     COLONOSCOPY N/A 01/04/2015   Procedure: COLONOSCOPY;  Surgeon: Rogelia Copping, MD;  Location: [facility];  Service: Gastroenterology;  Laterality: N/A;  with biopsies   CYSTOSCOPY W/ RETROGRADES Right 10/26/2016   Procedure: CYSTOSCOPY WITH RETROGRADE PYELOGRAM;  Surgeon: Rosina Riis, MD;  Location: [facility];  Service: Urology;  Laterality: Right;   CYSTOSCOPY WITH STENT PLACEMENT Left 10/26/2016   Procedure: CYSTOSCOPY WITH STENT PLACEMENT;  Surgeon: Rosina Riis, MD;  Location: [facility];  Service: Urology;  Laterality: Left;   ESOPHAGOGASTRODUODENOSCOPY (EGD) WITH PROPOFOL  N/A 12/30/2015   Procedure: ESOPHAGOGASTRODUODENOSCOPY (EGD) WITH PROPOFOL ;  Surgeon: Rogelia Copping, MD;  Location: [facility];  Service: Endoscopy;  Laterality: N/A;   ESOPHAGOGASTRODUODENOSCOPY (EGD) WITH PROPOFOL   N/A 08/12/2017   Procedure: ESOPHAGOGASTRODUODENOSCOPY (EGD) WITH PROPOFOL ;  Surgeon: Jinny Carmine, MD;  Location: [facility];  Service: Endoscopy;  Laterality: N/A;  CPAP   FOOT SURGERY     multiple   SEPTOPLASTY     SHOULDER ARTHROSCOPY Bilateral 2008   TONSILLECTOMY  6   TUBAL LIGATION     URETEROSCOPY WITH HOLMIUM LASER LITHOTRIPSY Left 10/26/2016   Procedure: URETEROSCOPY WITH HOLMIUM LASER LITHOTRIPSY;  Surgeon: Rosina Riis, MD;  Location: [facility];  Service: Urology;  Laterality: Left;   VAGINAL HYSTERECTOMY  1989   cervical cancer age 83   Patient Active Problem List   Diagnosis Date Noted   Nausea and vomiting    OSA (obstructive sleep apnea) 02/08/2017   Vitamin D  deficiency 07/21/2016   Hx of suicide attempt 07/19/2016   PTSD (post-traumatic stress disorder) 07/19/2016   Migraine headache 07/19/2016   RLS (restless  legs syndrome) 07/01/2016   Osteoporosis 07/01/2016   Trochanteric bursitis 03/17/2016   Prediabetes 03/17/2016   Gastritis    Pituitary adenoma (HCC) 10/02/2015   Borderline personality disorder (HCC) 02/10/2015   Chronic pain syndrome 02/10/2015   Arthritis 02/10/2015   Hx of colonic polyps    Seasonal allergies 12/21/2013   Fibromyalgia 12/21/2013   Moderate persistent asthma without complication 06/20/2013   Chronic nausea 06/20/2013   Bipolar affective disorder (HCC) 06/20/2013   Hx of cervical cancer 07/20/2012   Lumbar herniated disc 07/20/2012   Hyperlipidemia 07/20/2012   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 Dann, MD  REFERRING PROVIDER: Dr. Jesus  REFERRING DIAG:  Diagnosis  M54.31 (ICD-10-CM) - Sciatica, right side  M50.30 (ICD-10-CM) - Other cervical disc degeneration, unspecified cervical region  Z91.81 (ICD-10-CM) - History of falling    Rationale for Evaluation and Treatment: Rehabilitation  THERAPY DIAG:  Muscle weakness (generalized)  Physical deconditioning  Repeated falls  Difficulty in walking, not elsewhere classified  ONSET DATE: August 2025  SUBJECTIVE:                                                                                                                                                                                           SUBJECTIVE STATEMENT: Pt's main concern is she feels weak after multiple health concerns this year and multiple hospital stays (most recent in Aug 2025 for respiratory issues; per char review- recently returned to the hospital due to acute hypoxemic respiratory failure with worsening dyspnea and asthma exacerbation. )  Just had a course of home health PT- finished before outpatient PT   Started medications for breathing- those are helping; starts injection therapy this Friday (  Dupixent)  Walking with a SPC, sometimes walker, and sometimes no cane.   Most recently had a  fall 2 weeks ago- physical fatigue she feels like contributes to it  Difficulty standing stationary (bothers lower back)  Does some walking 100 m driveway, 4x wears a mask for this, feels SOB sometimes    PERTINENT HISTORY:  Just finished home health PT Was hospitalized for respiratory failure in August 2025 Has h/o low back pain- L3-4  Has had shoulder surgery b/l  PAIN:  Are you having pain? Currently 0/10 and at worst 9-10/10  PRECAUTIONS: Fall  RED FLAGS: None   WEIGHT BEARING RESTRICTIONS: No  FALLS:  Has patient fallen in last 6 months? Yes. Number of falls >3  LIVING ENVIRONMENT: Lives with: lives alone Lives in: Mobile home Stairs: 6 steps to enter/exit Has following equipment at home: Single point cane  Has family/sister support nearby where she lives   OCCUPATION: retired; enjoys environmental manager, does puzzles  PLOF: Independent  PATIENT GOALS: to be able to walk, able to hike, able to walk [place] (beach trip planned for October 2025), able to travel again  NEXT MD VISIT: End of September   OBJECTIVE:  Note: Objective measures were completed at Evaluation unless otherwise noted.  DIAGNOSTIC FINDINGS: See chart review  PATIENT SURVEYS:  LEFS 40/80  COGNITION: Overall cognitive status: Within functional limits for tasks assessed     SENSATION: WFL   POSTURE: rounded shoulders   LUMBAR ROM: deferred  AROM eval  Flexion   Extension   Right lateral flexion   Left lateral flexion   Right rotation   Left rotation    (Blank rows = not tested)  LOWER EXTREMITY ROM:   deferred; pt able to transfer sit to stand  Active  Right eval Left eval  Hip flexion    Hip extension    Hip abduction    Hip adduction    Hip internal rotation    Hip external rotation    Knee flexion    Knee extension    Ankle dorsiflexion    Ankle plantarflexion    Ankle inversion    Ankle eversion     (Blank rows = not tested)  LOWER EXTREMITY MMT:     MMT Right eval Left eval  Hip flexion 4 4  Hip extension    Hip abduction    Hip adduction    Hip internal rotation    Hip external rotation    Knee flexion 4 4  Knee extension 4 4  Ankle dorsiflexion 4 4  Ankle plantarflexion    Ankle inversion    Ankle eversion     (Blank rows = not tested)   FUNCTIONAL TESTS:  5 times sit to stand: 9 seconds today   SLS: 3-5 seconds each LE   TUG 11 seconds today GAIT: Pt amb with SPC in clinic today  TREATMENT DATE: 07/26/2024  Subjective:  Pt returns to PT after a gap in tx after being admitted to the hospital for a brief stay for evaluation of epigastric pain with unclear etiology.  Determined she had gastroparesis.  Now is eating a bit better.  Feels like her stamina has improved for exercise.    Objective: Therapeutic Exercise: Nustep level 2 x 10 minutes- not today Walking in hallway for endurance- 5 min, then sitting rest x 1 min, then walk 3 minutes (8 min total) with SPC, PT supervision  Seated cervical retraction x 10, 2 sets Scapular retraction 10,  2 sets (1 set with ER/W's) Self nerve glide (Tray) slider x10, 2 sets- not today Seated knee extension: 4# 20x; 2 sets   Therapeutic Activities: Sit to stand 10 reps, 2 sets Lateral stepping in parallel bars with blue band at thighs: 4 laps Standing marches: 2x15 4# weights, 2 sets  Standing hamstring curl: x15 4# standing ea LE, 2 sets ea LE, x10 sitting heel slide Heel raises: 2x10 ea Step up: 6 inch height 6 min walk test (see above)   PATIENT EDUCATION:  Education details: HEP, PT POC/goals Person educated: Patient Education method: Explanation Education comprehension: verbalized understanding  HOME EXERCISE PROGRAM: Access Code: [email or web address] URL: [email or web address] Date: 07/07/2024 Prepared by: Vernell Reges  Exercises - Supine Sciatic Nerve Glide  - 2 x daily - 7 x weekly - 2 sets - 10 reps - Standing Lumbar Extension with Counter  - 2 x  daily - 7 x weekly - 3 sets - 5 reps - Squat with Chair Touch  - 1 x daily - 7 x weekly - 2 sets - 10 reps - Forward Step Up with Counter Support  - 1 x daily - 7 x weekly - 2 sets - 10 reps - Seated Cervical Retraction  - 3 x daily - 7 x weekly - 2 sets - 10 reps - Median Nerve Flossing - Tray  - 3 x daily - 7 x weekly - 2 sets - 10 reps - Seated Scapular Retraction  - 3 x daily - 7 x weekly - 2 sets - 10 reps  ASSESSMENT:  CLINICAL IMPRESSION: Patient arrived to PT using her SPC today.  She did not report any epigastric pain during today's session.   Verbal/tactile cues for exercise pacing/technique implemented today as pt tends to move too quickly through exercises.  Walking endurance/tolerance is improved this session vs last tx session.  Multiple yellow flags still influencing her prognosis.  Overall, she would benefit from continued skilled PT interventions to improve strength, activity tolerance, and improve her safety/reduce fall risks to facilitate improved QOL.  OBJECTIVE IMPAIRMENTS: decreased activity tolerance, decreased balance, difficulty walking, decreased strength, impaired perceived functional ability, and pain.   ACTIVITY LIMITATIONS: standing, squatting, stairs, transfers, and locomotion level  PARTICIPATION LIMITATIONS: meal prep, cleaning, laundry, shopping, community activity, and yard work  PERSONAL FACTORS: Past/current experiences, Time since onset of injury/illness/exacerbation, and 1-2 comorbidities: see PMH are also affecting patient's functional outcome.   REHAB POTENTIAL: Good  CLINICAL DECISION MAKING: Evolving/moderate complexity  EVALUATION COMPLEXITY: Moderate   GOALS: Goals reviewed with patient? Yes  SHORT TERM GOALS: Target date: 04/05/24  Complete 6 min walk test to establish baseline endurance for amb  Baseline:at visit #2; 06/19/24: 5 min duration, not 6 min yet Goal status: IN progress   LONG TERM GOALS: Target date: 08/01/23  Improve  LEFS >10 points indicating pt is less significantly limited in her ability to perform her daily activities Baseline: 40; 12/15: 30 Goal status: IN progress  2.  Pt will be able to amb on even/uneven surfaces x 15 min without AD to do errands/grocery shopping independently Baseline: SPC used for amb on flat surfaces, 12/15: pt using rolling walker now after recent hospitalization Goal status: IN progress  3.  Pt will be able to perform >20 min consecutive standing housechores to complete her housework/meal prep/laundry without being limited by fatigue/SOB Baseline: 10 min intervals Goal status: IN progress   PLAN:  PT FREQUENCY: 2x/week  PT DURATION: 6 weeks  PLANNED INTERVENTIONS: 97110-Therapeutic exercises,  02469- Therapeutic activity, [id number]- Neuromuscular re-education, [phone]- Self Care, [id number]- Manual therapy, and Patient/Family education.  PLAN FOR NEXT SESSION: continue with tx emphasizing endurance/conditioning, LE strengthening, stair intervals, walking program  Vernell Reges, PT, DPT, OCS  Sema Stangler E Aspyn Warnke, PT 07/26/2024, 11:01 AM  "

## 2024-07-28 ENCOUNTER — Ambulatory Visit

## 2024-07-28 DIAGNOSIS — R5381 Other malaise: Secondary | ICD-10-CM

## 2024-07-28 DIAGNOSIS — M6281 Muscle weakness (generalized): Secondary | ICD-10-CM | POA: Diagnosis not present

## 2024-07-28 DIAGNOSIS — R262 Difficulty in walking, not elsewhere classified: Secondary | ICD-10-CM

## 2024-07-28 DIAGNOSIS — R296 Repeated falls: Secondary | ICD-10-CM

## 2024-07-28 NOTE — Therapy (Signed)
 " OUTPATIENT PHYSICAL THERAPY THORACOLUMBAR Treatment/re-cert through 07/31/24   Patient Name: Brandi Bell MRN: [id number] DOB:February 12, 1961, 64 y.o., female Today's Date: 07/28/2024  END OF SESSION:  PT End of Session - 07/28/24 0808     Visit Number 18    Number of Visits 24    Date for Recertification  07/31/24    Authorization Type request 2x/week x 6 weeks (Medicare PN needed at visit #20)    PT Start Time 0805    PT Stop Time 0850    PT Time Calculation (min) 45 min    Equipment Utilized During Treatment Gait belt    Activity Tolerance Patient tolerated treatment well    Behavior During Therapy WFL for tasks assessed/performed             Past Medical History:  Diagnosis Date   Anemia    hx of   Anxiety    Arthritis    osteo Back, Feet, Hands   Asthma    [facility] admission - on Bipap - 7/13 - report on paper chart/ flare 3 mos ago   Bipolar disorder (HCC)    Bipolar disorder (HCC)    Bulging lumbar disc    Cancer (HCC)    cervical   Cataract    Depression    Elevated LFTs    having ultrasound [facility] - 12/14/14   GERD (gastroesophageal reflux disease)    vomiting   Headache    migraines, every three or 4 days/ stress related   Heart attack (HCC)    hx of/ Dr. 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, self repaired   History of kidney stones    History of migraine headaches    Hypertension    Echo -1/16 - report on paper chart/controlled on meds   IBS (irritable bowel syndrome)    Microhematuria    Motion sickness    Osteoporosis    Pneumonia    hx of   PONV (postoperative nausea and vomiting)    PTSD (post-traumatic stress disorder)    Restless leg syndrome    Rhinitis    Shortness of breath dyspnea    with exertion   Sleep apnea    central and obstructive/ CPAP   Spinal stenosis of cervical region    Suicide attempt [facility])    Vertigo 6 yrs ago   hx of   Past Surgical History:  Procedure Laterality Date    ANTERIOR AND POSTERIOR REPAIR  2002   done with bladder tact   BILATERAL SALPINGOOPHORECTOMY  2004   pain from scar tissue   CARDIAC CATHETERIZATION  2008   [facility] - neg - report in paper chart   CATARACT EXTRACTION Bilateral 2007   CHOLECYSTECTOMY     COLONOSCOPY N/A 01/04/2015   Procedure: COLONOSCOPY;  Surgeon: Rogelia Copping, MD;  Location: [facility];  Service: Gastroenterology;  Laterality: N/A;  with biopsies   CYSTOSCOPY W/ RETROGRADES Right 10/26/2016   Procedure: CYSTOSCOPY WITH RETROGRADE PYELOGRAM;  Surgeon: Rosina Riis, MD;  Location: [facility];  Service: Urology;  Laterality: Right;   CYSTOSCOPY WITH STENT PLACEMENT Left 10/26/2016   Procedure: CYSTOSCOPY WITH STENT PLACEMENT;  Surgeon: Rosina Riis, MD;  Location: [facility];  Service: Urology;  Laterality: Left;   ESOPHAGOGASTRODUODENOSCOPY (EGD) WITH PROPOFOL  N/A 12/30/2015   Procedure: ESOPHAGOGASTRODUODENOSCOPY (EGD) WITH PROPOFOL ;  Surgeon: Rogelia Copping, MD;  Location: [facility];  Service: Endoscopy;  Laterality: N/A;   ESOPHAGOGASTRODUODENOSCOPY (EGD) WITH PROPOFOL   N/A 08/12/2017   Procedure: ESOPHAGOGASTRODUODENOSCOPY (EGD) WITH PROPOFOL ;  Surgeon: Jinny Carmine, MD;  Location: [facility];  Service: Endoscopy;  Laterality: N/A;  CPAP   FOOT SURGERY     multiple   SEPTOPLASTY     SHOULDER ARTHROSCOPY Bilateral 2008   TONSILLECTOMY  6   TUBAL LIGATION     URETEROSCOPY WITH HOLMIUM LASER LITHOTRIPSY Left 10/26/2016   Procedure: URETEROSCOPY WITH HOLMIUM LASER LITHOTRIPSY;  Surgeon: Rosina Riis, MD;  Location: [facility];  Service: Urology;  Laterality: Left;   VAGINAL HYSTERECTOMY  1989   cervical cancer age 63   Patient Active Problem List   Diagnosis Date Noted   Nausea and vomiting    OSA (obstructive sleep apnea) 02/08/2017   Vitamin D  deficiency 07/21/2016   Hx of suicide attempt 07/19/2016   PTSD (post-traumatic stress disorder) 07/19/2016   Migraine headache 07/19/2016   RLS (restless  legs syndrome) 07/01/2016   Osteoporosis 07/01/2016   Trochanteric bursitis 03/17/2016   Prediabetes 03/17/2016   Gastritis    Pituitary adenoma (HCC) 10/02/2015   Borderline personality disorder (HCC) 02/10/2015   Chronic pain syndrome 02/10/2015   Arthritis 02/10/2015   Hx of colonic polyps    Seasonal allergies 12/21/2013   Fibromyalgia 12/21/2013   Moderate persistent asthma without complication 06/20/2013   Chronic nausea 06/20/2013   Bipolar affective disorder (HCC) 06/20/2013   Hx of cervical cancer 07/20/2012   Lumbar herniated disc 07/20/2012   Hyperlipidemia 07/20/2012   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 Dann, MD  REFERRING PROVIDER: Dr. Jesus  REFERRING DIAG:  Diagnosis  M54.31 (ICD-10-CM) - Sciatica, right side  M50.30 (ICD-10-CM) - Other cervical disc degeneration, unspecified cervical region  Z91.81 (ICD-10-CM) - History of falling    Rationale for Evaluation and Treatment: Rehabilitation  THERAPY DIAG:  No diagnosis found.  ONSET DATE: August 2025  SUBJECTIVE:                                                                                                                                                                                           SUBJECTIVE STATEMENT: Pt's main concern is she feels weak after multiple health concerns this year and multiple hospital stays (most recent in Aug 2025 for respiratory issues; per char review- recently returned to the hospital due to acute hypoxemic respiratory failure with worsening dyspnea and asthma exacerbation. )  Just had a course of home health PT- finished before outpatient PT   Started medications for breathing- those are helping; starts injection therapy this Friday (Dupixent)  Walking with a SPC, sometimes walker, and sometimes no cane.  Most recently had a fall 2 weeks ago- physical fatigue she feels like contributes to it  Difficulty standing stationary  (bothers lower back)  Does some walking 100 m driveway, 4x wears a mask for this, feels SOB sometimes    PERTINENT HISTORY:  Just finished home health PT Was hospitalized for respiratory failure in August 2025 Has h/o low back pain- L3-4  Has had shoulder surgery b/l  PAIN:  Are you having pain? Currently 0/10 and at worst 9-10/10  PRECAUTIONS: Fall  RED FLAGS: None   WEIGHT BEARING RESTRICTIONS: No  FALLS:  Has patient fallen in last 6 months? Yes. Number of falls >3  LIVING ENVIRONMENT: Lives with: lives alone Lives in: Mobile home Stairs: 6 steps to enter/exit Has following equipment at home: Single point cane  Has family/sister support nearby where she lives   OCCUPATION: retired; enjoys environmental manager, does puzzles  PLOF: Independent  PATIENT GOALS: to be able to walk, able to hike, able to walk [place] (beach trip planned for October 2025), able to travel again  NEXT MD VISIT: End of September   OBJECTIVE:  Note: Objective measures were completed at Evaluation unless otherwise noted.  DIAGNOSTIC FINDINGS: See chart review  PATIENT SURVEYS:  LEFS 40/80  COGNITION: Overall cognitive status: Within functional limits for tasks assessed     SENSATION: WFL   POSTURE: rounded shoulders   LUMBAR ROM: deferred  AROM eval  Flexion   Extension   Right lateral flexion   Left lateral flexion   Right rotation   Left rotation    (Blank rows = not tested)  LOWER EXTREMITY ROM:   deferred; pt able to transfer sit to stand  Active  Right eval Left eval  Hip flexion    Hip extension    Hip abduction    Hip adduction    Hip internal rotation    Hip external rotation    Knee flexion    Knee extension    Ankle dorsiflexion    Ankle plantarflexion    Ankle inversion    Ankle eversion     (Blank rows = not tested)  LOWER EXTREMITY MMT:    MMT Right eval Left eval  Hip flexion 4 4  Hip extension    Hip abduction    Hip adduction     Hip internal rotation    Hip external rotation    Knee flexion 4 4  Knee extension 4 4  Ankle dorsiflexion 4 4  Ankle plantarflexion    Ankle inversion    Ankle eversion     (Blank rows = not tested)   FUNCTIONAL TESTS:  5 times sit to stand: 9 seconds today   SLS: 3-5 seconds each LE   TUG 11 seconds today GAIT: Pt amb with SPC in clinic today  TREATMENT DATE: 07/28/2024  Subjective:  Pt states she felt pretty good after PT on Monday.  She Feels like her stamina has improved for exercise.  No falls, but her balance isn't good.    Objective: Therapeutic Exercise: Nustep level 3 x 5 min, level 4 x 5 min today (.53 miles today) Walking in hallway for endurance- 5 min, then sitting rest x 1 min, then walk 3 minutes (8 min total) with SPC, PT supervision  Seated cervical retraction x 10, 2 sets Scapular retraction 10, 2 sets (1 set with ER/W's) Self nerve glide (Tray) slider x10, 2 sets- not today Seated knee extension: 4# 20x; 2 sets Seated lumbar stretch with  physioball roll out x 5  Therapeutic Activities: Sit to stand 10 reps, 2 sets; holding 5# weight as front squat Lateral stepping in parallel bars with blue band at thighs: 5 laps Standing marches: 2x15 4# weights, 2 sets  Standing hamstring curl: x15 4# standing ea LE, 2 sets ea LE, x10 sitting heel slide Heel raises: 2x10 ea Step up: 6 inch height holding 5# weight, RLLR x 10, LRRL x 10, then ascend/descend stairs 2 laps with 1 railing Standing balance with feet together on airex: 90 second intervals x 2, added reaching with fishing activity today 6 min walk test (deferred)   PATIENT EDUCATION:  Education details: HEP, PT POC/goals Person educated: Patient Education method: Explanation Education comprehension: verbalized understanding  HOME EXERCISE PROGRAM: Access Code: [email or web address] URL: [email or web address] Date: 07/07/2024 Prepared by: Vernell Reges  Exercises - Supine Sciatic Nerve Glide   - 2 x daily - 7 x weekly - 2 sets - 10 reps - Standing Lumbar Extension with Counter  - 2 x daily - 7 x weekly - 3 sets - 5 reps - Squat with Chair Touch  - 1 x daily - 7 x weekly - 2 sets - 10 reps - Forward Step Up with Counter Support  - 1 x daily - 7 x weekly - 2 sets - 10 reps - Seated Cervical Retraction  - 3 x daily - 7 x weekly - 2 sets - 10 reps - Median Nerve Flossing - Tray  - 3 x daily - 7 x weekly - 2 sets - 10 reps - Seated Scapular Retraction  - 3 x daily - 7 x weekly - 2 sets - 10 reps  ASSESSMENT:  CLINICAL IMPRESSION: Patient arrived to PT without any AD today.  Able to tolerate progression of strengthening with increased weights and she was able to perform LE strengthening with RPE of 5-6 today.  Balance activity was challenging and PT stayed close for supervision with gait belt, R LE less stable than L today but no knee buckling observed.  She Multiple yellow flags still influencing her prognosis.  Overall, she would benefit from continued skilled PT interventions to improve strength, activity tolerance, and improve her safety/reduce fall risks to facilitate improved QOL.  OBJECTIVE IMPAIRMENTS: decreased activity tolerance, decreased balance, difficulty walking, decreased strength, impaired perceived functional ability, and pain.   ACTIVITY LIMITATIONS: standing, squatting, stairs, transfers, and locomotion level  PARTICIPATION LIMITATIONS: meal prep, cleaning, laundry, shopping, community activity, and yard work  PERSONAL FACTORS: Past/current experiences, Time since onset of injury/illness/exacerbation, and 1-2 comorbidities: see PMH are also affecting patient's functional outcome.   REHAB POTENTIAL: Good  CLINICAL DECISION MAKING: Evolving/moderate complexity  EVALUATION COMPLEXITY: Moderate   GOALS: Goals reviewed with patient? Yes  SHORT TERM GOALS: Target date: 04/05/24  Complete 6 min walk test to establish baseline endurance for amb  Baseline:at visit #2;  06/19/24: 5 min duration, not 6 min yet Goal status: IN progress   LONG TERM GOALS: Target date: 08/01/23  Improve LEFS >10 points indicating pt is less significantly limited in her ability to perform her daily activities Baseline: 40; 12/15: 30 Goal status: IN progress  2.  Pt will be able to amb on even/uneven surfaces x 15 min without AD to do errands/grocery shopping independently Baseline: SPC used for amb on flat surfaces, 12/15: pt using rolling walker now after recent hospitalization Goal status: IN progress  3.  Pt will be able to perform >20 min consecutive standing housechores to complete her housework/meal  prep/laundry without being limited by fatigue/SOB Baseline: 10 min intervals Goal status: IN progress   PLAN:  PT FREQUENCY: 2x/week  PT DURATION: 6 weeks  PLANNED INTERVENTIONS: 97110-Therapeutic exercises, 97530- Therapeutic activity, [id number]- Neuromuscular re-education, 97535- Self Care, [id number]- Manual therapy, and Patient/Family education.  PLAN FOR NEXT SESSION: continue with tx emphasizing endurance/conditioning, LE strengthening, stair intervals, walking program  Vernell Reges, PT, DPT, OCS  Jye Fariss E Rasheen Schewe, PT 07/28/2024, 8:10 AM  "

## 2024-07-31 ENCOUNTER — Ambulatory Visit

## 2024-08-02 ENCOUNTER — Ambulatory Visit

## 2024-08-02 DIAGNOSIS — R5381 Other malaise: Secondary | ICD-10-CM

## 2024-08-02 DIAGNOSIS — R296 Repeated falls: Secondary | ICD-10-CM

## 2024-08-02 DIAGNOSIS — M6281 Muscle weakness (generalized): Secondary | ICD-10-CM | POA: Diagnosis not present

## 2024-08-02 DIAGNOSIS — R262 Difficulty in walking, not elsewhere classified: Secondary | ICD-10-CM

## 2024-08-02 NOTE — Therapy (Signed)
 " OUTPATIENT PHYSICAL THERAPY THORACOLUMBAR Treatment/Progress Note/re-cert through 08/30/24   Patient Name: Brandi Bell MRN: [id number] DOB:1960-12-18, 64 y.o., female Today's Date: 08/02/2024  END OF SESSION:  PT End of Session - 08/02/24 0900     Visit Number 19    Number of Visits 24    Date for Recertification  07/31/24    Authorization Type request 2x/week x 6 weeks (Medicare PN needed at visit #20)    PT Start Time 0900    PT Stop Time 0945    PT Time Calculation (min) 45 min    Equipment Utilized During Treatment Gait belt    Activity Tolerance Patient tolerated treatment well    Behavior During Therapy WFL for tasks assessed/performed             Past Medical History:  Diagnosis Date   Anemia    hx of   Anxiety    Arthritis    osteo Back, Feet, Hands   Asthma    [facility] admission - on Bipap - 7/13 - report on paper chart/ flare 3 mos ago   Bipolar disorder (HCC)    Bipolar disorder (HCC)    Bulging lumbar disc    Cancer (HCC)    cervical   Cataract    Depression    Elevated LFTs    having ultrasound [facility] - 12/14/14   GERD (gastroesophageal reflux disease)    vomiting   Headache    migraines, every three or 4 days/ stress related   Heart attack (HCC)    hx of/ Dr. 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, self repaired   History of kidney stones    History of migraine headaches    Hypertension    Echo -1/16 - report on paper chart/controlled on meds   IBS (irritable bowel syndrome)    Microhematuria    Motion sickness    Osteoporosis    Pneumonia    hx of   PONV (postoperative nausea and vomiting)    PTSD (post-traumatic stress disorder)    Restless leg syndrome    Rhinitis    Shortness of breath dyspnea    with exertion   Sleep apnea    central and obstructive/ CPAP   Spinal stenosis of cervical region    Suicide attempt [facility])    Vertigo 6 yrs ago   hx of   Past Surgical History:  Procedure Laterality  Date   ANTERIOR AND POSTERIOR REPAIR  2002   done with bladder tact   BILATERAL SALPINGOOPHORECTOMY  2004   pain from scar tissue   CARDIAC CATHETERIZATION  2008   [facility] - neg - report in paper chart   CATARACT EXTRACTION Bilateral 2007   CHOLECYSTECTOMY     COLONOSCOPY N/A 01/04/2015   Procedure: COLONOSCOPY;  Surgeon: Rogelia Copping, MD;  Location: [facility];  Service: Gastroenterology;  Laterality: N/A;  with biopsies   CYSTOSCOPY W/ RETROGRADES Right 10/26/2016   Procedure: CYSTOSCOPY WITH RETROGRADE PYELOGRAM;  Surgeon: Rosina Riis, MD;  Location: [facility];  Service: Urology;  Laterality: Right;   CYSTOSCOPY WITH STENT PLACEMENT Left 10/26/2016   Procedure: CYSTOSCOPY WITH STENT PLACEMENT;  Surgeon: Rosina Riis, MD;  Location: [facility];  Service: Urology;  Laterality: Left;   ESOPHAGOGASTRODUODENOSCOPY (EGD) WITH PROPOFOL  N/A 12/30/2015   Procedure: ESOPHAGOGASTRODUODENOSCOPY (EGD) WITH PROPOFOL ;  Surgeon: Rogelia Copping, MD;  Location: [facility];  Service: Endoscopy;  Laterality: N/A;   ESOPHAGOGASTRODUODENOSCOPY (EGD) WITH  PROPOFOL  N/A 08/12/2017   Procedure: ESOPHAGOGASTRODUODENOSCOPY (EGD) WITH PROPOFOL ;  Surgeon: Jinny Carmine, MD;  Location: [facility];  Service: Endoscopy;  Laterality: N/A;  CPAP   FOOT SURGERY     multiple   SEPTOPLASTY     SHOULDER ARTHROSCOPY Bilateral 2008   TONSILLECTOMY  6   TUBAL LIGATION     URETEROSCOPY WITH HOLMIUM LASER LITHOTRIPSY Left 10/26/2016   Procedure: URETEROSCOPY WITH HOLMIUM LASER LITHOTRIPSY;  Surgeon: Rosina Riis, MD;  Location: [facility];  Service: Urology;  Laterality: Left;   VAGINAL HYSTERECTOMY  1989   cervical cancer age 68   Patient Active Problem List   Diagnosis Date Noted   Nausea and vomiting    OSA (obstructive sleep apnea) 02/08/2017   Vitamin D  deficiency 07/21/2016   Hx of suicide attempt 07/19/2016   PTSD (post-traumatic stress disorder) 07/19/2016   Migraine headache 07/19/2016   RLS  (restless legs syndrome) 07/01/2016   Osteoporosis 07/01/2016   Trochanteric bursitis 03/17/2016   Prediabetes 03/17/2016   Gastritis    Pituitary adenoma (HCC) 10/02/2015   Borderline personality disorder (HCC) 02/10/2015   Chronic pain syndrome 02/10/2015   Arthritis 02/10/2015   Hx of colonic polyps    Seasonal allergies 12/21/2013   Fibromyalgia 12/21/2013   Moderate persistent asthma without complication 06/20/2013   Chronic nausea 06/20/2013   Bipolar affective disorder (HCC) 06/20/2013   Hx of cervical cancer 07/20/2012   Lumbar herniated disc 07/20/2012   Hyperlipidemia 07/20/2012   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 Dann, MD  REFERRING PROVIDER: Dr. Jesus  REFERRING DIAG:  Diagnosis  M54.31 (ICD-10-CM) - Sciatica, right side  M50.30 (ICD-10-CM) - Other cervical disc degeneration, unspecified cervical region  Z91.81 (ICD-10-CM) - History of falling    Rationale for Evaluation and Treatment: Rehabilitation  THERAPY DIAG:  Muscle weakness (generalized)  Physical deconditioning  Repeated falls  Difficulty in walking, not elsewhere classified  ONSET DATE: August 2025  SUBJECTIVE:                                                                                                                                                                                           SUBJECTIVE STATEMENT: Pt's main concern is she feels weak after multiple health concerns this year and multiple hospital stays (most recent in Aug 2025 for respiratory issues; per char review- recently returned to the hospital due to acute hypoxemic respiratory failure with worsening dyspnea and asthma exacerbation. )  Just had a course of home health PT- finished before outpatient PT   Started medications for breathing- those are helping; starts injection therapy this  Friday (Dupixent)  Walking with a SPC, sometimes walker, and sometimes no cane.   Most  recently had a fall 2 weeks ago- physical fatigue she feels like contributes to it  Difficulty standing stationary (bothers lower back)  Does some walking 100 m driveway, 4x wears a mask for this, feels SOB sometimes    PERTINENT HISTORY:  Just finished home health PT Was hospitalized for respiratory failure in August 2025 Has h/o low back pain- L3-4  Has had shoulder surgery b/l  PAIN:  Are you having pain? Currently 0/10 and at worst 9-10/10  PRECAUTIONS: Fall  RED FLAGS: None   WEIGHT BEARING RESTRICTIONS: No  FALLS:  Has patient fallen in last 6 months? Yes. Number of falls >3  LIVING ENVIRONMENT: Lives with: lives alone Lives in: Mobile home Stairs: 6 steps to enter/exit Has following equipment at home: Single point cane  Has family/sister support nearby where she lives   OCCUPATION: retired; enjoys environmental manager, does puzzles  PLOF: Independent  PATIENT GOALS: to be able to walk, able to hike, able to [place] (beach trip planned for October 2025), able to travel again  NEXT MD VISIT: End of September   OBJECTIVE:  Note: Objective measures were completed at Evaluation unless otherwise noted.  DIAGNOSTIC FINDINGS: See chart review  PATIENT SURVEYS:  LEFS 40/80  COGNITION: Overall cognitive status: Within functional limits for tasks assessed     SENSATION: WFL   POSTURE: rounded shoulders   LUMBAR ROM: deferred  AROM eval  Flexion   Extension   Right lateral flexion   Left lateral flexion   Right rotation   Left rotation    (Blank rows = not tested)  LOWER EXTREMITY ROM:   deferred; pt able to transfer sit to stand  Active  Right eval Left eval  Hip flexion    Hip extension    Hip abduction    Hip adduction    Hip internal rotation    Hip external rotation    Knee flexion    Knee extension    Ankle dorsiflexion    Ankle plantarflexion    Ankle inversion    Ankle eversion     (Blank rows = not tested)  LOWER  EXTREMITY MMT:    MMT Right eval Left eval  Hip flexion 4 4  Hip extension    Hip abduction    Hip adduction    Hip internal rotation    Hip external rotation    Knee flexion 4 4  Knee extension 4 4  Ankle dorsiflexion 4 4  Ankle plantarflexion    Ankle inversion    Ankle eversion     (Blank rows = not tested)   FUNCTIONAL TESTS:  5 times sit to stand: 9 seconds today   SLS: 3-5 seconds each LE   TUG 11 seconds today GAIT: Pt amb with SPC in clinic today  TREATMENT DATE: 08/02/2024  Subjective:  Pt states she felt pretty good upon arrival today; no falls.  She arrived without any AD.  Overall, feels like she is getting stronger since recently getting out of the hospital.  Standing endurance is still limited at home for doing her house chores/dishes.  Objective: Therapeutic Exercise: Nustep level 3 x 5 min, level 4 x 5 min today (.53 miles today)- not today Walking in hallway for endurance- 5 min, then sitting rest x 1 min, then walk 3 minutes (8 min total) with SPC, PT supervision - not today Seated cervical retraction  x 10, 2 sets Scapular retraction 10, 2 sets (1 set with ER/W's) Self nerve glide (Tray) slider x10, 2 sets- not today Seated knee extension: 5# 20x; 2 sets Seated lumbar stretch with physioball roll out x 5 Standing hip abd: 2x10 ea 5# weights  Therapeutic Activities: Sit to stand 10 reps, 2 sets; holding 5# weight as front squat Lateral stepping in parallel bars with blue band at thighs: 5 laps Standing marches: 2x15 5# weights, 2 sets  Dynadisc- sitting without UE support, 5# ankle weights x 20 ea LE marches Standing hamstring curl: x15 4# standing ea LE, 2 sets ea LE, x10 sitting heel slide- not today Heel raises: 2x10 ea, holding 5# weight Step up: 6 inch height holding 5# weight, RLLR x 10, LRRL x 10, then ascend/descend stairs 2 laps with 1 railing BOSU: front step ups x 10 ea, step up and over x 10 ea, SLS x5 ea 5-10 second intervals Standing  balance with feet together on airex: 90 second intervals x 2, added reaching with fishing activity today- not today Farmer's carry with 2x 8# weights; 3x70 ft, 2 rounds, then amb 210 ft without weight at end Pallof press 2x10 each side 30lbs with 3 lateral step outs for each rep  Updated goals- see below   PATIENT EDUCATION:  Education details: HEP, PT POC/goals Person educated: Patient Education method: Explanation Education comprehension: verbalized understanding  HOME EXERCISE PROGRAM: Access Code: [email or web address] URL: [email or web address] Date: 07/07/2024 Prepared by: Vernell Reges  Exercises - Supine Sciatic Nerve Glide  - 2 x daily - 7 x weekly - 2 sets - 10 reps - Standing Lumbar Extension with Counter  - 2 x daily - 7 x weekly - 3 sets - 5 reps - Squat with Chair Touch  - 1 x daily - 7 x weekly - 2 sets - 10 reps - Forward Step Up with Counter Support  - 1 x daily - 7 x weekly - 2 sets - 10 reps - Seated Cervical Retraction  - 3 x daily - 7 x weekly - 2 sets - 10 reps - Median Nerve Flossing - Tray  - 3 x daily - 7 x weekly - 2 sets - 10 reps - Seated Scapular Retraction  - 3 x daily - 7 x weekly - 2 sets - 10 reps  ASSESSMENT:  CLINICAL IMPRESSION: Patient arrived to PT without any AD today.  Able to tolerate progression of strengthening with increased weights and multi-group muscle exercise emphasis.  She did not have any major losses of balance during session.  She is making progress towards goals; she was able to perform LE strengthening with RPE of 5-6 today.   Overall, she would benefit from continued skilled PT interventions to improve strength, activity tolerance, and improve her safety/reduce fall risks to facilitate improved QOL.    OBJECTIVE IMPAIRMENTS: decreased activity tolerance, decreased balance, difficulty walking, decreased strength, impaired perceived functional ability, and pain.   ACTIVITY LIMITATIONS: standing, squatting, stairs, transfers,  and locomotion level  PARTICIPATION LIMITATIONS: meal prep, cleaning, laundry, shopping, community activity, and yard work  PERSONAL FACTORS: Past/current experiences, Time since onset of injury/illness/exacerbation, and 1-2 comorbidities: see PMH are also affecting patient's functional outcome.   REHAB POTENTIAL: Good  CLINICAL DECISION MAKING: Evolving/moderate complexity  EVALUATION COMPLEXITY: Moderate   GOALS: Goals reviewed with patient? Yes  SHORT TERM GOALS: Target date: 04/05/24  Complete 6 min walk test to establish baseline endurance for amb  Baseline:at visit #2; 06/19/24: 5 min duration,  not 6 min yet Goal status: IN progress   LONG TERM GOALS: Target date: 08/30/24  Improve LEFS >10 points indicating pt is less significantly limited in her ability to perform her daily activities Baseline: 40; 12/15: 30; 08/02/24: 45 Goal status: IN progress  2.  Pt will be able to amb on even/uneven surfaces x 15 min without AD to do errands/grocery shopping independently Baseline: SPC used for amb on flat surfaces, 12/15: pt using rolling walker now after recent hospitalization; 1/28: pt not using any AD now, able to amb on 1/21: 5 min, then sitting rest x 1 min, then walk 3 minutes (8 min total) with SPC,  Goal status: IN progress  3.  Pt will be able to perform >20 min consecutive standing housechores to complete her housework/meal prep/laundry without being limited by fatigue/SOB Baseline: 10 min intervals; 1/28: 10-15 min intervals with sit breaks Goal status: IN progress  PLAN:  PT FREQUENCY: 2x/week  PT DURATION: 4 weeks  PLANNED INTERVENTIONS: 97110-Therapeutic exercises, 97530- Therapeutic activity, [id number]- Neuromuscular re-education, 97535- Self Care, [id number]- Manual therapy, and Patient/Family education.  PLAN FOR NEXT SESSION: continue with interventions to improve strength, endurance, standing/walking tolerance, and reduce fall risk   Vernell Reges, PT, DPT, OCS   Shiva Sahagian E Meylin Stenzel, PT 08/02/2024, 9:01 AM  "

## 2024-08-07 ENCOUNTER — Ambulatory Visit

## 2024-08-09 ENCOUNTER — Ambulatory Visit

## 2024-08-11 ENCOUNTER — Ambulatory Visit

## 2024-08-14 ENCOUNTER — Ambulatory Visit

## 2024-08-16 ENCOUNTER — Ambulatory Visit

## 2024-08-21 ENCOUNTER — Ambulatory Visit

## 2024-08-23 ENCOUNTER — Ambulatory Visit

## 2024-08-28 ENCOUNTER — Ambulatory Visit

## 2024-08-30 ENCOUNTER — Ambulatory Visit
